# Patient Record
Sex: Female | Born: 1965 | Hispanic: No | Marital: Married | Smoking: Never smoker
Health system: Southern US, Community
[De-identification: ages and names within clinical notes are randomized; demographics above are authoritative.]

## PROBLEM LIST (undated history)

## (undated) DIAGNOSIS — Z923 Personal history of irradiation: Secondary | ICD-10-CM

## (undated) DIAGNOSIS — Z9221 Personal history of antineoplastic chemotherapy: Secondary | ICD-10-CM

## (undated) DIAGNOSIS — E0590 Thyrotoxicosis, unspecified without thyrotoxic crisis or storm: Secondary | ICD-10-CM

## (undated) DIAGNOSIS — K219 Gastro-esophageal reflux disease without esophagitis: Secondary | ICD-10-CM

## (undated) DIAGNOSIS — IMO0002 Reserved for concepts with insufficient information to code with codable children: Secondary | ICD-10-CM

## (undated) DIAGNOSIS — I10 Essential (primary) hypertension: Secondary | ICD-10-CM

## (undated) DIAGNOSIS — R51 Headache: Secondary | ICD-10-CM

## (undated) HISTORY — PX: WISDOM TOOTH EXTRACTION: SHX21

## (undated) HISTORY — PX: TUBAL LIGATION: SHX77

## (undated) HISTORY — DX: Reserved for concepts with insufficient information to code with codable children: IMO0002

## (undated) HISTORY — PX: FOOT SURGERY: SHX648

## (undated) HISTORY — PX: BREAST LUMPECTOMY: SHX2

---

## 2007-02-03 ENCOUNTER — Emergency Department (HOSPITAL_COMMUNITY): Admission: EM | Admit: 2007-02-03 | Discharge: 2007-02-04 | Payer: Self-pay | Admitting: Emergency Medicine

## 2007-03-27 DIAGNOSIS — R87619 Unspecified abnormal cytological findings in specimens from cervix uteri: Secondary | ICD-10-CM

## 2007-03-27 DIAGNOSIS — IMO0002 Reserved for concepts with insufficient information to code with codable children: Secondary | ICD-10-CM

## 2007-03-27 HISTORY — DX: Unspecified abnormal cytological findings in specimens from cervix uteri: R87.619

## 2007-03-27 HISTORY — DX: Reserved for concepts with insufficient information to code with codable children: IMO0002

## 2007-04-07 ENCOUNTER — Encounter: Admission: RE | Admit: 2007-04-07 | Discharge: 2007-04-07 | Payer: Self-pay | Admitting: Family Medicine

## 2008-01-10 ENCOUNTER — Encounter: Admission: RE | Admit: 2008-01-10 | Discharge: 2008-01-10 | Payer: Self-pay | Admitting: Neurology

## 2011-01-02 LAB — URINALYSIS, ROUTINE W REFLEX MICROSCOPIC
Bilirubin Urine: NEGATIVE
Glucose, UA: NEGATIVE
Hgb urine dipstick: NEGATIVE
Ketones, ur: NEGATIVE
Nitrite: NEGATIVE
Protein, ur: NEGATIVE
Specific Gravity, Urine: 1.012
Urobilinogen, UA: 0.2
pH: 7.5

## 2011-01-02 LAB — COMPREHENSIVE METABOLIC PANEL
ALT: 20
AST: 18
Albumin: 3.2 — ABNORMAL LOW
Alkaline Phosphatase: 67
BUN: 11
CO2: 26
Calcium: 8.1 — ABNORMAL LOW
Chloride: 99
Creatinine, Ser: 0.68
GFR calc Af Amer: >60
GFR calc non Af Amer: >60
Glucose, Bld: 151 — ABNORMAL HIGH
Potassium: 3.5
Sodium: 132 — ABNORMAL LOW
Total Bilirubin: 0.4
Total Protein: 6.2

## 2011-01-02 LAB — DIFFERENTIAL
Basophils Absolute: 0
Basophils Relative: 0
Eosinophils Absolute: 0.1 — ABNORMAL LOW
Eosinophils Relative: 1
Lymphocytes Relative: 12
Lymphs Abs: 1.4
Monocytes Absolute: 0.4
Monocytes Relative: 3
Neutro Abs: 10.4 — ABNORMAL HIGH
Neutrophils Relative %: 85 — ABNORMAL HIGH

## 2011-01-02 LAB — CBC
HCT: 34 — ABNORMAL LOW
Hemoglobin: 11.4 — ABNORMAL LOW
MCHC: 33.6
MCV: 83.2
Platelets: 312
RBC: 4.08
RDW: 13.9
WBC: 12.4 — ABNORMAL HIGH

## 2011-01-02 LAB — LIPASE, BLOOD: Lipase: 25

## 2011-01-02 LAB — PREGNANCY, URINE: Preg Test, Ur: NEGATIVE

## 2011-06-01 ENCOUNTER — Encounter (INDEPENDENT_AMBULATORY_CARE_PROVIDER_SITE_OTHER): Payer: BC Managed Care – PPO | Admitting: Obstetrics and Gynecology

## 2011-06-01 ENCOUNTER — Other Ambulatory Visit (INDEPENDENT_AMBULATORY_CARE_PROVIDER_SITE_OTHER): Payer: BC Managed Care – PPO

## 2011-06-01 DIAGNOSIS — N920 Excessive and frequent menstruation with regular cycle: Secondary | ICD-10-CM

## 2011-06-01 DIAGNOSIS — N946 Dysmenorrhea, unspecified: Secondary | ICD-10-CM

## 2011-06-19 ENCOUNTER — Other Ambulatory Visit (INDEPENDENT_AMBULATORY_CARE_PROVIDER_SITE_OTHER): Payer: BC Managed Care – PPO

## 2011-06-19 DIAGNOSIS — N920 Excessive and frequent menstruation with regular cycle: Secondary | ICD-10-CM

## 2011-06-28 ENCOUNTER — Other Ambulatory Visit: Payer: Self-pay | Admitting: Internal Medicine

## 2011-06-28 DIAGNOSIS — R7989 Other specified abnormal findings of blood chemistry: Secondary | ICD-10-CM

## 2011-07-10 ENCOUNTER — Ambulatory Visit (HOSPITAL_COMMUNITY): Payer: Self-pay

## 2011-07-11 ENCOUNTER — Other Ambulatory Visit (HOSPITAL_COMMUNITY): Payer: Self-pay

## 2011-07-31 ENCOUNTER — Ambulatory Visit (HOSPITAL_COMMUNITY): Payer: Self-pay

## 2011-07-31 ENCOUNTER — Encounter (HOSPITAL_COMMUNITY)
Admission: RE | Admit: 2011-07-31 | Discharge: 2011-07-31 | Disposition: A | Discharge status: Home / Self Care | Payer: BC Managed Care – PPO | Source: Ambulatory Visit | Attending: Internal Medicine | Admitting: Internal Medicine

## 2011-07-31 DIAGNOSIS — R7989 Other specified abnormal findings of blood chemistry: Secondary | ICD-10-CM

## 2011-08-01 ENCOUNTER — Ambulatory Visit (HOSPITAL_COMMUNITY)
Admission: RE | Admit: 2011-08-01 | Discharge: 2011-08-01 | Disposition: A | Discharge status: Home / Self Care | Payer: BC Managed Care – PPO | Source: Ambulatory Visit | Attending: Internal Medicine | Admitting: Internal Medicine

## 2011-08-01 ENCOUNTER — Other Ambulatory Visit (HOSPITAL_COMMUNITY): Payer: Self-pay

## 2011-08-01 DIAGNOSIS — E041 Nontoxic single thyroid nodule: Secondary | ICD-10-CM | POA: Insufficient documentation

## 2011-08-01 DIAGNOSIS — E0590 Thyrotoxicosis, unspecified without thyrotoxic crisis or storm: Secondary | ICD-10-CM | POA: Insufficient documentation

## 2011-08-01 MED ORDER — SODIUM PERTECHNETATE TC 99M INJECTION
10.0000 | Freq: Once | INTRAVENOUS | Status: AC | PRN
  Administered 2011-08-01: 10 via INTRAVENOUS

## 2011-08-01 MED ORDER — SODIUM IODIDE I 131 CAPSULE
10.0000 | Freq: Once | INTRAVENOUS | Status: AC | PRN
  Administered 2011-08-01: 10 via ORAL

## 2011-11-15 ENCOUNTER — Encounter (HOSPITAL_COMMUNITY): Payer: Self-pay | Admitting: Pharmacist

## 2011-11-22 ENCOUNTER — Encounter (HOSPITAL_COMMUNITY)
Admission: RE | Admit: 2011-11-22 | Discharge: 2011-11-22 | Disposition: A | Discharge status: Home / Self Care | Payer: BC Managed Care – PPO | Source: Ambulatory Visit | Attending: Obstetrics and Gynecology | Admitting: Obstetrics and Gynecology

## 2011-11-22 ENCOUNTER — Encounter (HOSPITAL_COMMUNITY): Payer: Self-pay

## 2011-11-22 ENCOUNTER — Telehealth: Payer: Self-pay | Admitting: Obstetrics and Gynecology

## 2011-11-22 HISTORY — DX: Headache: R51

## 2011-11-22 HISTORY — DX: Thyrotoxicosis, unspecified without thyrotoxic crisis or storm: E05.90

## 2011-11-22 HISTORY — DX: Essential (primary) hypertension: I10

## 2011-11-22 LAB — CBC
HCT: 36.1 % (ref 36.0–46.0)
Hemoglobin: 11.3 g/dL — ABNORMAL LOW (ref 12.0–15.0)
MCH: 25.3 pg — ABNORMAL LOW (ref 26.0–34.0)
MCHC: 31.3 g/dL (ref 30.0–36.0)
MCV: 80.9 fL (ref 78.0–100.0)
Platelets: 340 10*3/uL (ref 150–400)
RBC: 4.46 MIL/uL (ref 3.87–5.11)
RDW: 15.3 % (ref 11.5–15.5)
WBC: 6.3 10*3/uL (ref 4.0–10.5)

## 2011-11-22 LAB — BASIC METABOLIC PANEL
BUN: 16 mg/dL (ref 6–23)
CO2: 28 mEq/L (ref 19–32)
Calcium: 8.8 mg/dL (ref 8.4–10.5)
Chloride: 103 mEq/L (ref 96–112)
Creatinine, Ser: 0.77 mg/dL (ref 0.50–1.10)
GFR calc Af Amer: >90 mL/min (ref >90)
GFR calc non Af Amer: >90 mL/min (ref >90)
Glucose, Bld: 100 mg/dL — ABNORMAL HIGH (ref 70–99)
Potassium: 4.2 mEq/L (ref 3.5–5.1)
Sodium: 138 mEq/L (ref 135–145)

## 2011-11-22 NOTE — Patient Instructions (Addendum)
Your procedure is scheduled on:11/30/11  Enter through the Main Entrance at :0715 Pick up desk phone and dial [phone] and inform us of your arrival.  Please call [phone] if you have any problems the morning of surgery.  Remember: Do not eat after midnight:Thursday Do not drink after:midnight Thursday  Take these meds the morning of surgery with a sip of water: BP pill, Tapazole  DO NOT wear jewelry, eye make-up, lipstick,body lotion, or dark fingernail polish. Do not shave for 48 hours prior to surgery.   Patients discharged on the day of surgery will not be allowed to drive home.   Remember to use your Hibiclens as instructed.

## 2011-11-22 NOTE — Telephone Encounter (Signed)
Hysteroscopy D&C; Novasure scheduled for 11/30/11 @ 8:45 with AR>  [employer] effective 03/26/10. Plan pays 80/20 after a $500 deductible. Pre-op due $101.62.  -Adrianne Pridgen

## 2011-11-29 ENCOUNTER — Encounter: Payer: Self-pay | Admitting: Obstetrics and Gynecology

## 2011-11-29 ENCOUNTER — Ambulatory Visit (INDEPENDENT_AMBULATORY_CARE_PROVIDER_SITE_OTHER): Payer: BC Managed Care – PPO | Admitting: Obstetrics and Gynecology

## 2011-11-29 VITALS — BP 118/72 | HR 78 | Temp 98.2°F | Resp 16 | Ht 59.5 in | Wt 165.0 lb

## 2011-11-29 DIAGNOSIS — Z01818 Encounter for other preprocedural examination: Secondary | ICD-10-CM

## 2011-11-29 NOTE — H&P (Signed)
Sherry Carrillo is a 46 y.o. female G2P0 who presents for hysteroscopy, dilatation and curettage with Novasure endometrial ablation because of menorrhagia. For the past twenty-three years, patient has had a five day menstrual flow with pad change every hour, due to clots,  that has worsened over the years. Additionally she's had severe menstrual cramping rated as an 8/10 on a 10-point pain scale that is not relieved with Ibuprofen 800 mg or Vicodin.  In 2011 she had an ultrasound that showed a slightly enlarged uterus with questions of adenomyosis but otherwise normal.  At that same time an endometrial biopsy did not reveal any atypia, hyperplasia or malignancy but fragments of a benign polyp.  A pelvic ultrasound n March of this year showed a uterus-anteflexed, 8.90 x 5.54 x 5.17 cm, normal appearing ovaries and endometrium 0.67 cm.  An endometrial biopsy at that same time returned benign proliferative endometrium without atypia, malignancy or hyperplasia.  A thyroid panel done as part of her evaluation for menorrhagia revealed hyperthyroidism.   She is being followed by an endocrinologist and on methimazole.  A review of both medical and surgical management options were given to the patient, along with risks and benefits of each however, due to the protracted nature of her symptoms, she desires surgical intervention in the form of hysteroscopy, D & C and endometrial ablation.  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 ; Has a history of an abnormal PAP treated with a ? LEEP-2008, PAP smears normal since;  Last PAP smear 05/2011  Medical History: hyperthyroidism, hypertension  Surgical History: 1989 Tubal Sterilization;  1991 Left Foot Cystectomy   2008 LEEP? 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 benazepril (LOTENSIN) 20 MG tablet Take 20 mg by mouth daily.      . methimazole (TAPAZOLE) 5 MG tablet Take 5 mg by mouth daily.        Allergies  Allergen Reactions  . Sulfa Antibiotics     [Sulfa causes nausea] Denies sensitivity to soy, peanuts, shellfish, latex, or adhesives  ROS: Admits to glasses;  Denies headache, vision changes, dysphagia, tinnitus, dizziness,  chest pain, shortness of breath, nausea, vomiting, diarrhea, dysuria, hematuria, pelvic pain, swelling of joints,easy bruising,  myalgias, arthralgias, skin rashes and except as is mentioned in the history of present illness, patient's review of systems is otherwise negative   Physical Exam    BP 118/72  Pulse 78  Temp 98.2 F (36.8 C) (Oral)  Resp 16  Ht 4' 11.5" (1.511 m)  Wt 165 lb (74.844 kg)  BMI 32.77 kg/m2  LMP 11/12/2011  Neck: supple without masses or thyromegaly Lungs: clear to auscultation Heart: regular rate and rhythm Abdomen: soft, non-tender and no organomegaly Pelvic:EGBUS- wnl; vagina-normal rugae; uterus-normal size and tender, cervix without lesions or motion tenderness; adnexae-no tenderness or masses Extremities:  no clubbing, cyanosis or edema   Assesment:  Menorrhagia   Disposition:  A discussion was held with patient regarding the indication for her procedure(s) along with the risks, which include but are not limited  to: reaction to anesthesia, damage to adjacent organs, infection and excessive bleeding. Hystersocopy D&C with Novasure endometrial ablation at [facility] [place] [facility], November 30, 2011 @ 8:30 a.m.  CSN# [id number]   Florette Thai J. Lowell Guitar, PA-C  for Dr. Woodroe Mode. Su Hilt

## 2011-11-29 NOTE — Progress Notes (Signed)
Sherry Carrillo is a 46 y.o. female G2P0 who presents for hysteroscopy, dilatation and curettage with Novasure endometrial ablation because of menorrhagia. For the past twenty-three years, patient has had a five day menstrual flow with pad change every hour, due to clots,  that has worsened over the years. Additionally she's had severe menstrual cramping rated as an 8/10 on a 10-point pain scale that is not relieved with Ibuprofen 800 mg or Vicodin.  In 2011 she had an ultrasound that showed a slightly enlarged uterus with questions of adenomyosis but otherwise normal.  At that same time an endometrial biopsy did not reveal any atypia, hyperplasia or malignancy but fragments of a benign polyp.  A pelvic ultrasound n March of this year showed a uterus-anteflexed, 8.90 x 5.54 x 5.17 cm, normal appearing ovaries and endometrium 0.67 cm.  An endometrial biopsy at that same time returned benign proliferative endometrium without atypia, malignancy or hyperplasia.  A thyroid panel done as part of her evaluation for menorrhagia revealed hyperthyroidism.   She is being followed by an endocrinologist and on methimazole.  A review of both medical and surgical management options were given to the patient, along with risks and benefits of each however, due to the protracted nature of her symptoms, she desires surgical intervention. 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 ; Has a history of an abnormal PAP treated with a ? LEEP-2008, PAP smears normal since;  Last PAP smear 05/2011  Medical History: hyperthyroidism, hypertension  Surgical History: 1989 Tubal Sterilization;  1991 Left Foot Cystectomy   2008 LEEP? 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 benazepril (LOTENSIN) 20 MG tablet Take 20 mg by mouth daily.      . methimazole (TAPAZOLE) 5 MG tablet Take 5 mg by mouth daily.        Allergies  Allergen Reactions  . Sulfa Antibiotics     [Sulfa causes nausea] Denies sensitivity to soy, peanuts, shellfish, latex, or adhesives  ROS: Admits to glasses;  Denies headache, vision changes, dysphagia, tinnitus, dizziness,  chest pain, shortness of breath, nausea, vomiting, diarrhea, dysuria, hematuria, pelvic pain, swelling of joints,easy bruising,  myalgias, arthralgias, skin rashes and except as is mentioned in the history of present illness, patient's review of systems is otherwise negative   Physical Exam    BP 118/72  Pulse 78  Temp 98.2 F (36.8 C) (Oral)  Resp 16  Ht 4' 11.5" (1.511 m)  Wt 165 lb (74.844 kg)  BMI 32.77 kg/m2  LMP 11/12/2011  Neck: supple without masses or thyromegaly Lungs: clear to auscultation Heart: regular rate and rhythm Abdomen: soft, non-tender and no organomegaly Pelvic:EGBUS- wnl; vagina-normal rugae; uterus-normal size and tender, cervix without lesions or motion tenderness; adnexae-no tenderness or masses Extremities:  no clubbing, cyanosis or edema   Assesment:  Menorrhagia   Disposition:  A discussion was held with patient regarding the indication for her procedure(s) along with the risks, which include but are not limited to: reaction to anesthesia, damage to adjacent organs, infection and excessive bleeding.  Hystersocopy D&C with Novasure endometrial ablation at [facility] [place], November 30, 2011 @ 8:30 a.m.  CSN# [id number]   Odelle Kosier J. Lowell Guitar, PA-C  for Dr. Woodroe Mode. Su Hilt

## 2011-11-30 ENCOUNTER — Encounter (HOSPITAL_COMMUNITY): Payer: Self-pay | Admitting: *Deleted

## 2011-11-30 ENCOUNTER — Encounter (HOSPITAL_COMMUNITY): Payer: Self-pay

## 2011-11-30 ENCOUNTER — Encounter (HOSPITAL_COMMUNITY)
Admission: RE | Disposition: A | Discharge status: Home / Self Care | Payer: Self-pay | Source: Ambulatory Visit | Attending: Obstetrics and Gynecology

## 2011-11-30 ENCOUNTER — Ambulatory Visit (HOSPITAL_COMMUNITY): Payer: BC Managed Care – PPO

## 2011-11-30 ENCOUNTER — Ambulatory Visit (HOSPITAL_COMMUNITY)
Admission: RE | Admit: 2011-11-30 | Discharge: 2011-11-30 | Disposition: A | Discharge status: Home / Self Care | Payer: BC Managed Care – PPO | Source: Ambulatory Visit | Attending: Obstetrics and Gynecology | Admitting: Obstetrics and Gynecology

## 2011-11-30 DIAGNOSIS — N920 Excessive and frequent menstruation with regular cycle: Secondary | ICD-10-CM

## 2011-11-30 LAB — PREGNANCY, URINE: Preg Test, Ur: NEGATIVE

## 2011-11-30 SURGERY — DILATATION & CURETTAGE/HYSTEROSCOPY WITH NOVASURE ABLATION
Anesthesia: General | Site: Vagina | Wound class: Clean Contaminated

## 2011-11-30 MED ORDER — FENTANYL CITRATE 0.05 MG/ML IJ SOLN
INTRAMUSCULAR | Status: DC | PRN
  Administered 2011-11-30 (×2): 50 ug via INTRAVENOUS

## 2011-11-30 MED ORDER — HYDROCODONE-ACETAMINOPHEN 5-500 MG PO TABS: 1.0000 | ORAL_TABLET | Freq: Four times a day (QID) | ORAL | Status: AC | PRN

## 2011-11-30 MED ORDER — PROPOFOL 10 MG/ML IV EMUL
INTRAVENOUS | Status: AC
Start: 2011-11-30 — End: 2011-11-30
  Filled 2011-11-30: qty 20

## 2011-11-30 MED ORDER — MIDAZOLAM HCL 5 MG/5ML IJ SOLN
INTRAMUSCULAR | Status: DC | PRN
  Administered 2011-11-30: 2 mg via INTRAVENOUS

## 2011-11-30 MED ORDER — ONDANSETRON HCL 4 MG/2ML IJ SOLN
INTRAMUSCULAR | Status: AC
  Filled 2011-11-30: qty 2

## 2011-11-30 MED ORDER — DEXAMETHASONE SODIUM PHOSPHATE 10 MG/ML IJ SOLN
INTRAMUSCULAR | Status: AC
  Filled 2011-11-30: qty 1

## 2011-11-30 MED ORDER — MIDAZOLAM HCL 2 MG/2ML IJ SOLN
INTRAMUSCULAR | Status: AC
  Filled 2011-11-30: qty 2

## 2011-11-30 MED ORDER — LIDOCAINE HCL (CARDIAC) 20 MG/ML IV SOLN
INTRAVENOUS | Status: AC
  Filled 2011-11-30: qty 5

## 2011-11-30 MED ORDER — ONDANSETRON HCL 4 MG/2ML IJ SOLN: 4.0000 mg | Freq: Once | INTRAMUSCULAR | Status: DC | PRN

## 2011-11-30 MED ORDER — KETOROLAC TROMETHAMINE 30 MG/ML IJ SOLN
INTRAMUSCULAR | Status: DC | PRN
  Administered 2011-11-30: 30 mg via INTRAVENOUS
  Administered 2011-11-30: 30 mg via INTRAMUSCULAR

## 2011-11-30 MED ORDER — IBUPROFEN 600 MG PO TABS: 600.0000 mg | ORAL_TABLET | Freq: Four times a day (QID) | ORAL | Status: DC | PRN

## 2011-11-30 MED ORDER — LACTATED RINGERS IV SOLN
INTRAVENOUS | Status: DC
  Administered 2011-11-30 (×2): via INTRAVENOUS

## 2011-11-30 MED ORDER — GLYCOPYRROLATE 0.2 MG/ML IJ SOLN
INTRAMUSCULAR | Status: DC | PRN
  Administered 2011-11-30: 0.2 mg via INTRAVENOUS

## 2011-11-30 MED ORDER — FENTANYL CITRATE 0.05 MG/ML IJ SOLN
INTRAMUSCULAR | Status: AC
  Filled 2011-11-30: qty 2

## 2011-11-30 MED ORDER — FENTANYL CITRATE 0.05 MG/ML IJ SOLN
INTRAMUSCULAR | Status: AC
  Administered 2011-11-30: 50 ug via INTRAVENOUS
  Filled 2011-11-30: qty 2

## 2011-11-30 MED ORDER — LACTATED RINGERS IR SOLN
Status: DC | PRN
  Administered 2011-11-30: 500 mL

## 2011-11-30 MED ORDER — DEXAMETHASONE SODIUM PHOSPHATE 4 MG/ML IJ SOLN
INTRAMUSCULAR | Status: DC | PRN
  Administered 2011-11-30: 10 mg via INTRAVENOUS

## 2011-11-30 MED ORDER — PROPOFOL 10 MG/ML IV BOLUS
INTRAVENOUS | Status: DC | PRN
  Administered 2011-11-30: 200 mg via INTRAVENOUS

## 2011-11-30 MED ORDER — LIDOCAINE HCL 1 % IJ SOLN
INTRAMUSCULAR | Status: DC | PRN
  Administered 2011-11-30: 10 mL

## 2011-11-30 MED ORDER — LIDOCAINE HCL (CARDIAC) 20 MG/ML IV SOLN
INTRAVENOUS | Status: DC | PRN
  Administered 2011-11-30: 20 mg via INTRAVENOUS

## 2011-11-30 MED ORDER — KETOROLAC TROMETHAMINE 30 MG/ML IJ SOLN
INTRAMUSCULAR | Status: AC
  Filled 2011-11-30: qty 2

## 2011-11-30 MED ORDER — PROPOFOL 10 MG/ML IV EMUL
INTRAVENOUS | Status: DC | PRN
  Administered 2011-11-30: 200 mg via INTRAVENOUS

## 2011-11-30 MED ORDER — IBUPROFEN 600 MG PO TABS: 600.0000 mg | ORAL_TABLET | Freq: Four times a day (QID) | ORAL | Status: AC | PRN

## 2011-11-30 MED ORDER — MEPERIDINE HCL 25 MG/ML IJ SOLN: 6.2500 mg | INTRAMUSCULAR | Status: DC | PRN

## 2011-11-30 MED ORDER — ONDANSETRON HCL 4 MG/2ML IJ SOLN
INTRAMUSCULAR | Status: DC | PRN
  Administered 2011-11-30: 4 mg via INTRAVENOUS

## 2011-11-30 MED ORDER — FENTANYL CITRATE 0.05 MG/ML IJ SOLN
25.0000 ug | INTRAMUSCULAR | Status: DC | PRN
  Administered 2011-11-30: 50 ug via INTRAVENOUS

## 2011-11-30 MED ORDER — GLYCOPYRROLATE 0.2 MG/ML IJ SOLN
INTRAMUSCULAR | Status: AC
  Filled 2011-11-30: qty 1

## 2011-11-30 MED ORDER — KETOROLAC TROMETHAMINE 30 MG/ML IJ SOLN: 15.0000 mg | Freq: Once | INTRAMUSCULAR | Status: DC | PRN

## 2011-11-30 MED ORDER — HYDROCODONE-ACETAMINOPHEN 5-500 MG PO TABS: 1.0000 | ORAL_TABLET | Freq: Four times a day (QID) | ORAL | Status: DC | PRN

## 2011-11-30 SURGICAL SUPPLY — 14 items
ABLATOR ENDOMETRIAL BIPOLAR (ABLATOR) ×2 IMPLANT
CATH ROBINSON RED A/P 16FR (CATHETERS) ×2 IMPLANT
CLOTH BEACON ORANGE TIMEOUT ST (SAFETY) ×2 IMPLANT
CONTAINER PREFILL 10% NBF 60ML (FORM) ×3 IMPLANT
GLOVE BIO SURGEON STRL SZ7.5 (GLOVE) ×4 IMPLANT
GLOVE BIOGEL PI IND STRL 7.5 (GLOVE) ×1 IMPLANT
GLOVE BIOGEL PI INDICATOR 7.5 (GLOVE) ×1
GOWN PREVENTION PLUS LG XLONG (DISPOSABLE) ×3 IMPLANT
GOWN STRL REIN XL XLG (GOWN DISPOSABLE) ×2 IMPLANT
PACK HYSTEROSCOPY LF (CUSTOM PROCEDURE TRAY) ×2 IMPLANT
PAD PREP 24X48 CUFFED NSTRL (MISCELLANEOUS) ×2 IMPLANT
TISSUE REPAIR XENFORM 6X10CM (Tissue) ×1 IMPLANT
TOWEL OR 17X24 6PK STRL BLUE (TOWEL DISPOSABLE) ×4 IMPLANT
WATER STERILE IRR 1000ML POUR (IV SOLUTION) ×2 IMPLANT

## 2011-11-30 NOTE — Transfer of Care (Signed)
Immediate Anesthesia Transfer of Care Note  Patient: Sherry Carrillo  Procedure(s) Performed: Procedure(s) (LRB) with comments: DILATATION & CURETTAGE/HYSTEROSCOPY WITH NOVASURE ABLATION (N/A)  Patient Location: PACU  Anesthesia Type: General  Level of Consciousness: awake, alert  and oriented  Airway & Oxygen Therapy: Patient Spontanous Breathing and Patient connected to nasal cannula oxygen  Post-op Assessment: Report given to PACU RN and Post -op Vital signs reviewed and stable  Post vital signs: Reviewed and stable  Complications: No apparent anesthesia complications

## 2011-11-30 NOTE — Op Note (Signed)
Preop Diagnosis: Menorrhagia   Postop Diagnosis: Menorrhagia   Procedure: DILATATION & CURETTAGE/HYSTEROSCOPY WITH NOVASURE ABLATION   Anesthesia: General   Anesthesiologist: Sandrea Hughs., MD   Attending: Purcell Nails, MD   Assistant: N/a  Findings: Polypoid appearing endometrium. Ut sounded 10cm, cervix 4cm, cavity length 6cm, cavity width 4.8cm, ablation performed for 58secs at a power of 158watts.  Pathology: Endometrial Curettings  Fluids: 700cc  Hysteroscopic deficit 50-100cc  UOP: QS via straight cath prior to procedure  EBL: Minimal  Complications: None  Procedure: The patient was taken to the operating room after the risks, benefits and alternatives discussed with the patient. The patient verbalized understanding and consent signed and witnessed. The patient was given a spinal per anesthesia and prepped and draped in the normal sterile fashion and Time Out performed per protocol. A bivalve speculum was placed in the patient's vagina and the anterior lip of the cervix was grasped with a single tooth tenaculum. A paracervical block was administered using a total of 10 cc of 1% lidocaine. The uterus sounded to 10 cm. The cervix was dilated for passage of the hysteroscope. The hysteroscope was introduced into the uterine cavity and findings as noted above. Sharp curettage was performed until a gritty texture was noted and currettings sent to pathology. The hysteroscope was reintroduced and no obvious remaining intracavitary lesions were noted. The Novasure instrument was introduced and ablation performed without difficulty. Hysteroscope reintroduced and good ablation results were noted. All instruments were removed. Sponge lap and needle count was correct. The patient tolerated the procedure well and was returned to the recovery room in good condition.

## 2011-11-30 NOTE — Anesthesia Postprocedure Evaluation (Signed)
Anesthesia Post Note  Patient: Sherry Carrillo  Procedure(s) Performed: Procedure(s) (LRB): DILATATION & CURETTAGE/HYSTEROSCOPY WITH NOVASURE ABLATION (N/A)  Anesthesia type: GA  Patient location: PACU  Post pain: Pain level controlled  Post assessment: Post-op Vital signs reviewed  Last Vitals:  Filed Vitals:   11/30/11 0945  BP:   Pulse:   Temp: 36.6 C  Resp: 16    Post vital signs: Reviewed  Level of consciousness: sedated  Complications: No apparent anesthesia complications

## 2011-11-30 NOTE — Anesthesia Preprocedure Evaluation (Signed)
Anesthesia Evaluation  Patient identified by MRN, date of birth, ID band Patient awake    Reviewed: Allergy & Precautions, H&P , NPO status , Patient's Chart, lab work & pertinent test results  Airway Mallampati: I TM Distance: >3 FB Neck ROM: full    Dental No notable dental hx. (+) Teeth Intact   Pulmonary neg pulmonary ROS,    Pulmonary exam normal       Cardiovascular     Neuro/Psych negative psych ROS   GI/Hepatic negative GI ROS, Neg liver ROS,   Endo/Other    Renal/GU negative Renal ROS  negative genitourinary   Musculoskeletal negative musculoskeletal ROS (+)   Abdominal Normal abdominal exam  (+)   Peds negative pediatric ROS (+)  Hematology negative hematology ROS (+)   Anesthesia Other Findings   Reproductive/Obstetrics negative OB ROS                           Anesthesia Physical Anesthesia Plan  ASA: II  Anesthesia Plan: General   Post-op Pain Management:    Induction: Intravenous  Airway Management Planned: LMA  Additional Equipment:   Intra-op Plan:   Post-operative Plan:   Informed Consent: I have reviewed the patients History and Physical, chart, labs and discussed the procedure including the risks, benefits and alternatives for the proposed anesthesia with the patient or authorized representative who has indicated his/her understanding and acceptance.     Plan Discussed with: CRNA and Surgeon  Anesthesia Plan Comments:         Anesthesia Quick Evaluation

## 2011-11-30 NOTE — Interval H&P Note (Signed)
History and Physical Interval Note:  11/30/2011 8:56 AM  Sherry Carrillo  has presented today for surgery, with the diagnosis of Menorrhagia  The various methods of treatment have been discussed with the patient and family. After consideration of risks, benefits and other options for treatment, the patient has consented to  Procedure(s) (LRB) with comments: DILATATION & CURETTAGE/HYSTEROSCOPY WITH NOVASURE ABLATION (N/A) as a surgical intervention .  The patient's history has been reviewed, patient examined, no change in status, stable for surgery.  I have reviewed the patient's chart and labs.  Questions were answered to the patient's satisfaction.     Purcell Nails

## 2011-11-30 NOTE — Anesthesia Procedure Notes (Signed)
Procedure Name: LMA Insertion Date/Time: 11/30/2011 9:10 AM Performed by: Lincoln Brigham Pre-anesthesia Checklist: Patient identified, Patient being monitored, Emergency Drugs available, Timeout performed and Suction available Patient Re-evaluated:Patient Re-evaluated prior to inductionOxygen Delivery Method: Circle system utilized Preoxygenation: Pre-oxygenation with 100% oxygen Intubation Type: IV induction Ventilation: Mask ventilation without difficulty LMA: LMA inserted LMA Size: 3.0 Number of attempts: 1 Dental Injury: Teeth and Oropharynx as per pre-operative assessment

## 2011-11-30 NOTE — H&P (View-Only) (Signed)
Sherry Carrillo is a 46 y.o. female G2P0 who presents for hysteroscopy, dilatation and curettage with Novasure endometrial ablation because of menorrhagia. For the past twenty-three years, patient has had a five day menstrual flow with pad change every hour, due to clots,  that has worsened over the years. Additionally she's had severe menstrual cramping rated as an 8/10 on a 10-point pain scale that is not relieved with Ibuprofen 800 mg or Vicodin.  In 2011 she had an ultrasound that showed a slightly enlarged uterus with questions of adenomyosis but otherwise normal.  At that same time an endometrial biopsy did not reveal any atypia, hyperplasia or malignancy but fragments of a benign polyp.  A pelvic ultrasound n March of this year showed a uterus-anteflexed, 8.90 x 5.54 x 5.17 cm, normal appearing ovaries and endometrium 0.67 cm.  An endometrial biopsy at that same time returned benign proliferative endometrium without atypia, malignancy or hyperplasia.  A thyroid panel done as part of her evaluation for menorrhagia revealed hyperthyroidism.   She is being followed by an endocrinologist and on methimazole.  A review of both medical and surgical management options were given to the patient, along with risks and benefits of each however, due to the protracted nature of her symptoms, she desires surgical intervention in the form of hysteroscopy, D & C and endometrial ablation.  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 ; Has a history of an abnormal PAP treated with a ? LEEP-2008, PAP smears normal since;  Last PAP smear 05/2011  Medical History: hyperthyroidism, hypertension  Surgical History: 1989 Tubal Sterilization;  1991 Left Foot Cystectomy   2008 LEEP? Denies problems with anesthesia or history of blood transfusions  Family History: Lung and breast cancer,  thyroid disease, hypertension, heart disease and diabetes  Social History:  Married and employed as Assistant Manager with [employer]; Denies alcohol, tobacco or illicit drug use   Outpatient Encounter Prescriptions as of 11/29/2011  Medication Sig Dispense Refill  . benazepril (LOTENSIN) 20 MG tablet Take 20 mg by mouth daily.      . methimazole (TAPAZOLE) 5 MG tablet Take 5 mg by mouth daily.        Allergies  Allergen Reactions  . Sulfa Antibiotics     [Sulfa causes nausea] Denies sensitivity to soy, peanuts, shellfish, latex, or adhesives  ROS: Admits to glasses;  Denies headache, vision changes, dysphagia, tinnitus, dizziness,  chest pain, shortness of breath, nausea, vomiting, diarrhea, dysuria, hematuria, pelvic pain, swelling of joints,easy bruising,  myalgias, arthralgias, skin rashes and except as is mentioned in the history of present illness, patient's review of systems is otherwise negative   Physical Exam    BP 118/72  Pulse 78  Temp 98.2 F (36.8 C) (Oral)  Resp 16  Ht 4' 11.5" (1.511 m)  Wt 165 lb (74.844 kg)  BMI 32.77 kg/m2  LMP 11/12/2011  Neck: supple without masses or thyromegaly Lungs: clear to auscultation Heart: regular rate and rhythm Abdomen: soft, non-tender and no organomegaly Pelvic:EGBUS- wnl; vagina-normal rugae; uterus-normal size and tender, cervix without lesions or motion tenderness; adnexae-no tenderness or masses Extremities:  no clubbing, cyanosis or edema   Assesment:  Menorrhagia   Disposition:  A discussion was held with patient regarding the indication for her procedure(s) along with the risks, which include but are not limited   to: reaction to anesthesia, damage to adjacent organs, infection and excessive bleeding. Hystersocopy D&C with Novasure endometrial ablation at [facility], November 30, 2011 @ 8:30 a.m.  CSN# [id number]   Matilda Fleig J. Devyne Hauger, PA-C  for Dr. Angela Y. Roberts   

## 2011-12-19 ENCOUNTER — Other Ambulatory Visit: Payer: Self-pay

## 2011-12-19 ENCOUNTER — Telehealth: Payer: Self-pay

## 2011-12-19 DIAGNOSIS — R739 Hyperglycemia, unspecified: Secondary | ICD-10-CM

## 2011-12-19 NOTE — Telephone Encounter (Signed)
Spoke to pt to let her know about slightly elevated blood sugar.  Pt needs fasting blood glucose done. Pt has appt Monday so I will put order in and place her on the lab schedule for this test. Pt has endocrin. So if this test comes back high she was told she will need to f/u with her PCP Dr. Clovis Riley. Melody Comas A

## 2011-12-24 ENCOUNTER — Encounter: Payer: BC Managed Care – PPO | Admitting: Obstetrics and Gynecology

## 2011-12-24 ENCOUNTER — Other Ambulatory Visit: Payer: BC Managed Care – PPO

## 2013-04-01 ENCOUNTER — Other Ambulatory Visit: Payer: Self-pay | Admitting: Obstetrics and Gynecology

## 2013-04-01 DIAGNOSIS — Z1231 Encounter for screening mammogram for malignant neoplasm of breast: Secondary | ICD-10-CM

## 2013-04-23 ENCOUNTER — Ambulatory Visit
Admission: RE | Admit: 2013-04-23 | Discharge: 2013-04-23 | Disposition: A | Discharge status: Home / Self Care | Payer: BC Managed Care – PPO | Source: Ambulatory Visit | Attending: Obstetrics and Gynecology | Admitting: Obstetrics and Gynecology

## 2013-04-23 DIAGNOSIS — Z1231 Encounter for screening mammogram for malignant neoplasm of breast: Secondary | ICD-10-CM

## 2013-05-08 ENCOUNTER — Other Ambulatory Visit: Payer: Self-pay | Admitting: Obstetrics and Gynecology

## 2013-05-08 DIAGNOSIS — R928 Other abnormal and inconclusive findings on diagnostic imaging of breast: Secondary | ICD-10-CM

## 2013-05-21 ENCOUNTER — Ambulatory Visit
Admission: RE | Admit: 2013-05-21 | Discharge: 2013-05-21 | Disposition: A | Discharge status: Home / Self Care | Payer: BC Managed Care – PPO | Source: Ambulatory Visit | Attending: Obstetrics and Gynecology | Admitting: Obstetrics and Gynecology

## 2013-05-21 DIAGNOSIS — R928 Other abnormal and inconclusive findings on diagnostic imaging of breast: Secondary | ICD-10-CM

## 2013-11-04 ENCOUNTER — Other Ambulatory Visit: Payer: Self-pay | Admitting: Obstetrics and Gynecology

## 2013-11-04 DIAGNOSIS — N63 Unspecified lump in unspecified breast: Secondary | ICD-10-CM

## 2013-11-18 ENCOUNTER — Other Ambulatory Visit: Payer: BC Managed Care – PPO

## 2013-11-24 ENCOUNTER — Other Ambulatory Visit: Payer: BC Managed Care – PPO

## 2013-12-02 ENCOUNTER — Ambulatory Visit
Admission: RE | Admit: 2013-12-02 | Discharge: 2013-12-02 | Disposition: A | Discharge status: Home / Self Care | Payer: BC Managed Care – PPO | Source: Ambulatory Visit | Attending: Obstetrics and Gynecology | Admitting: Obstetrics and Gynecology

## 2013-12-02 DIAGNOSIS — N63 Unspecified lump in unspecified breast: Secondary | ICD-10-CM

## 2014-01-25 ENCOUNTER — Encounter (HOSPITAL_COMMUNITY): Payer: Self-pay

## 2014-04-27 ENCOUNTER — Other Ambulatory Visit: Payer: Self-pay | Admitting: Family Medicine

## 2014-04-27 DIAGNOSIS — N6310 Unspecified lump in the right breast, unspecified quadrant: Secondary | ICD-10-CM

## 2014-05-06 ENCOUNTER — Ambulatory Visit
Admission: RE | Admit: 2014-05-06 | Discharge: 2014-05-06 | Disposition: A | Discharge status: Home / Self Care | Payer: BLUE CROSS/BLUE SHIELD | Source: Ambulatory Visit | Attending: Family Medicine | Admitting: Family Medicine

## 2014-05-06 DIAGNOSIS — N6310 Unspecified lump in the right breast, unspecified quadrant: Secondary | ICD-10-CM

## 2015-02-15 ENCOUNTER — Ambulatory Visit: Payer: BLUE CROSS/BLUE SHIELD | Admitting: Internal Medicine

## 2015-03-07 ENCOUNTER — Ambulatory Visit (INDEPENDENT_AMBULATORY_CARE_PROVIDER_SITE_OTHER): Payer: BLUE CROSS/BLUE SHIELD | Admitting: Family

## 2015-03-07 ENCOUNTER — Encounter: Payer: Self-pay | Admitting: Family

## 2015-03-07 VITALS — BP 138/90 | HR 85 | Temp 98.2°F | Resp 18 | Ht 59.5 in | Wt 167.8 lb

## 2015-03-07 DIAGNOSIS — I10 Essential (primary) hypertension: Secondary | ICD-10-CM

## 2015-03-07 DIAGNOSIS — M25512 Pain in left shoulder: Secondary | ICD-10-CM | POA: Insufficient documentation

## 2015-03-07 MED ORDER — NAPROXEN 500 MG PO TABS: 500.0000 mg | ORAL_TABLET | Freq: Two times a day (BID) | ORAL | Status: DC

## 2015-03-07 MED ORDER — LISINOPRIL-HYDROCHLOROTHIAZIDE 20-12.5 MG PO TABS: 1.0000 | ORAL_TABLET | Freq: Every day | ORAL | Status: DC

## 2015-03-07 NOTE — Patient Instructions (Addendum)
Thank you for choosing [facility].  Summary/Instructions:  Please ice/heat 2-3 times per day as needed.   Your prescription(s) have been submitted to your pharmacy or been printed and provided for you. Please take as directed and contact our office if you believe you are having problem(s) with the medication(s) or have any questions.  If your symptoms worsen or fail to improve, please contact our office for further instruction, or in case of emergency go directly to the emergency room at the closest medical facility.   Impingement Syndrome, Rotator Cuff, Bursitis With Rehab Impingement syndrome is a condition that involves inflammation of the tendons of the rotator cuff and the subacromial bursa, that causes pain in the shoulder. The rotator cuff consists of four tendons and muscles that control much of the shoulder and upper arm function. The subacromial bursa is a fluid filled sac that helps reduce friction between the rotator cuff and one of the bones of the shoulder (acromion). Impingement syndrome is usually an overuse injury that causes swelling of the bursa (bursitis), swelling of the tendon (tendonitis), and/or a tear of the tendon (strain). Strains are classified into three categories. Grade 1 strains cause pain, but the tendon is not lengthened. Grade 2 strains include a lengthened ligament, due to the ligament being stretched or partially ruptured. With grade 2 strains there is still function, although the function may be decreased. Grade 3 strains include a complete tear of the tendon or muscle, and function is usually impaired. SYMPTOMS   Pain around the shoulder, often at the outer portion of the upper arm.  Pain that gets worse with shoulder function, especially when reaching overhead or lifting.  Sometimes, aching when not using the arm.  Pain that wakes you up at night.  Sometimes, tenderness, swelling, warmth, or redness over the affected area.  Loss of  strength.  Limited motion of the shoulder, especially reaching behind the back (to the back pocket or to unhook bra) or across your body.  Crackling sound (crepitation) when moving the arm.  Biceps tendon pain and inflammation (in the front of the shoulder). Worse when bending the elbow or lifting. CAUSES  Impingement syndrome is often an overuse injury, in which chronic (repetitive) motions cause the tendons or bursa to become inflamed. A strain occurs when a force is paced on the tendon or muscle that is greater than it can withstand. Common mechanisms of injury include: Stress from sudden increase in duration, frequency, or intensity of training.  Direct hit (trauma) to the shoulder.  Aging, erosion of the tendon with normal use.  Bony bump on shoulder (acromial spur). RISK INCREASES WITH:  Contact sports (football, wrestling, boxing).  Throwing sports (baseball, tennis, volleyball).  Weightlifting and bodybuilding.  Heavy labor.  Previous injury to the rotator cuff, including impingement.  Poor shoulder strength and flexibility.  Failure to warm up properly before activity.  Inadequate protective equipment.  Old age.  Bony bump on shoulder (acromial spur). PREVENTION   Warm up and stretch properly before activity.  Allow for adequate recovery between workouts.  Maintain physical fitness:  Strength, flexibility, and endurance.  Cardiovascular fitness.  Learn and use proper exercise technique. PROGNOSIS  If treated properly, impingement syndrome usually goes away within 6 weeks. Sometimes surgery is required.  RELATED COMPLICATIONS   Longer healing time if not properly treated, or if not given enough time to heal.  Recurring symptoms, that result in a chronic condition.  Shoulder stiffness, frozen shoulder, or loss of motion.  Rotator cuff tendon tear.  Recurring symptoms, especially if activity is resumed too soon, with overuse, with a direct blow, or  when using poor technique. TREATMENT  Treatment first involves the use of ice and medicine, to reduce pain and inflammation. The use of strengthening and stretching exercises may help reduce pain with activity. These exercises may be performed at home or with a therapist. If non-surgical treatment is unsuccessful after more than 6 months, surgery may be advised. After surgery and rehabilitation, activity is usually possible in 3 months.  MEDICATION  If pain medicine is needed, nonsteroidal anti-inflammatory medicines (aspirin and ibuprofen), or other minor pain relievers (acetaminophen), are often advised.  Do not take pain medicine for 7 days before surgery.  Prescription pain relievers may be given, if your caregiver thinks they are needed. Use only as directed and only as much as you need.  Corticosteroid injections may be given by your caregiver. These injections should be reserved for the most serious cases, because they may only be given a certain number of times. HEAT AND COLD  Cold treatment (icing) should be applied for 10 to 15 minutes every 2 to 3 hours for inflammation and pain, and immediately after activity that aggravates your symptoms. Use ice packs or an ice massage.  Heat treatment may be used before performing stretching and strengthening activities prescribed by your caregiver, physical therapist, or athletic trainer. Use a heat pack or a warm water soak. SEEK MEDICAL CARE IF:   Symptoms get worse or do not improve in 4 to 6 weeks, despite treatment.  New, unexplained symptoms develop. (Drugs used in treatment may produce side effects.) EXERCISES  RANGE OF MOTION (ROM) AND STRETCHING EXERCISES - Impingement Syndrome (Rotator Cuff  Tendinitis, Bursitis) These exercises may help you when beginning to rehabilitate your injury. Your symptoms may go away with or without further involvement from your physician, physical therapist or athletic trainer. While completing these  exercises, remember:   Restoring tissue flexibility helps normal motion to return to the joints. This allows healthier, less painful movement and activity.  An effective stretch should be held for at least 30 seconds.  A stretch should never be painful. You should only feel a gentle lengthening or release in the stretched tissue. STRETCH - Flexion, Standing  Stand with good posture. With an underhand grip on your right / left hand, and an overhand grip on the opposite hand, grasp a broomstick or cane so that your hands are a little more than shoulder width apart.  Keeping your right / left elbow straight and shoulder muscles relaxed, push the stick with your opposite hand, to raise your right / left arm in front of your body and then overhead. Raise your arm until you feel a stretch in your right / left shoulder, but before you have increased shoulder pain.  Try to avoid shrugging your right / left shoulder as your arm rises, by keeping your shoulder blade tucked down and toward your mid-back spine. Hold for __________ seconds.  Slowly return to the starting position. Repeat __________ times. Complete this exercise __________ times per day. STRETCH - Abduction, Supine  Lie on your back. With an underhand grip on your right / left hand and an overhand grip on the opposite hand, grasp a broomstick or cane so that your hands are a little more than shoulder width apart.  Keeping your right / left elbow straight and your shoulder muscles relaxed, push the stick with your opposite hand, to raise your  right / left arm out to the side of your body and then overhead. Raise your arm until you feel a stretch in your right / left shoulder, but before you have increased shoulder pain.  Try to avoid shrugging your right / left shoulder as your arm rises, by keeping your shoulder blade tucked down and toward your mid-back spine. Hold for __________ seconds.  Slowly return to the starting position. Repeat  __________ times. Complete this exercise __________ times per day. ROM - Flexion, Active-Assisted  Lie on your back. You may bend your knees for comfort.  Grasp a broomstick or cane so your hands are about shoulder width apart. Your right / left hand should grip the end of the stick, so that your hand is positioned "thumbs-up," as if you were about to shake hands.  Using your healthy arm to lead, raise your right / left arm overhead, until you feel a gentle stretch in your shoulder. Hold for __________ seconds.  Use the stick to assist in returning your right / left arm to its starting position. Repeat __________ times. Complete this exercise __________ times per day.  ROM - Internal Rotation, Supine   Lie on your back on a firm surface. Place your right / left elbow about 60 degrees away from your side. Elevate your elbow with a folded towel, so that the elbow and shoulder are the same height.  Using a broomstick or cane and your strong arm, pull your right / left hand toward your body until you feel a gentle stretch, but no increase in your shoulder pain. Keep your shoulder and elbow in place throughout the exercise.  Hold for __________ seconds. Slowly return to the starting position. Repeat __________ times. Complete this exercise __________ times per day. STRETCH - Internal Rotation  Place your right / left hand behind your back, palm up.  Throw a towel or belt over your opposite shoulder. Grasp the towel with your right / left hand.  While keeping an upright posture, gently pull up on the towel, until you feel a stretch in the front of your right / left shoulder.  Avoid shrugging your right / left shoulder as your arm rises, by keeping your shoulder blade tucked down and toward your mid-back spine.  Hold for __________ seconds. Release the stretch, by lowering your healthy hand. Repeat __________ times. Complete this exercise __________ times per day. ROM - Internal Rotation    Using an underhand grip, grasp a stick behind your back with both hands.  While standing upright with good posture, slide the stick up your back until you feel a mild stretch in the front of your shoulder.  Hold for __________ seconds. Slowly return to your starting position. Repeat __________ times. Complete this exercise __________ times per day.  STRETCH - Posterior Shoulder Capsule   Stand or sit with good posture. Grasp your right / left elbow and draw it across your chest, keeping it at the same height as your shoulder.  Pull your elbow, so your upper arm comes in closer to your chest. Pull until you feel a gentle stretch in the back of your shoulder.  Hold for __________ seconds. Repeat __________ times. Complete this exercise __________ times per day. STRENGTHENING EXERCISES - Impingement Syndrome (Rotator Cuff Tendinitis, Bursitis) These exercises may help you when beginning to rehabilitate your injury. They may resolve your symptoms with or without further involvement from your physician, physical therapist or athletic trainer. While completing these exercises, remember:  Muscles can  gain both the endurance and the strength needed for everyday activities through controlled exercises.  Complete these exercises as instructed by your physician, physical therapist or athletic trainer. Increase the resistance and repetitions only as guided.  You may experience muscle soreness or fatigue, but the pain or discomfort you are trying to eliminate should never worsen during these exercises. If this pain does get worse, stop and make sure you are following the directions exactly. If the pain is still present after adjustments, discontinue the exercise until you can discuss the trouble with your clinician.  During your recovery, avoid activity or exercises which involve actions that place your injured hand or elbow above your head or behind your back or head. These positions stress the  tissues which you are trying to heal. STRENGTH - Scapular Depression and Adduction   With good posture, sit on a firm chair. Support your arms in front of you, with pillows, arm rests, or on a table top. Have your elbows in line with the sides of your body.  Gently draw your shoulder blades down and toward your mid-back spine. Gradually increase the tension, without tensing the muscles along the top of your shoulders and the back of your neck.  Hold for __________ seconds. Slowly release the tension and relax your muscles completely before starting the next repetition.  After you have practiced this exercise, remove the arm support and complete the exercise in standing as well as sitting position. Repeat __________ times. Complete this exercise __________ times per day.  STRENGTH - Shoulder Abductors, Isometric  With good posture, stand or sit about 4-6 inches from a wall, with your right / left side facing the wall.  Bend your right / left elbow. Gently press your right / left elbow into the wall. Increase the pressure gradually, until you are pressing as hard as you can, without shrugging your shoulder or increasing any shoulder discomfort.  Hold for __________ seconds.  Release the tension slowly. Relax your shoulder muscles completely before you begin the next repetition. Repeat __________ times. Complete this exercise __________ times per day.  STRENGTH - External Rotators, Isometric  Keep your right / left elbow at your side and bend it 90 degrees.  Step into a door frame so that the outside of your right / left wrist can press against the door frame without your upper arm leaving your side.  Gently press your right / left wrist into the door frame, as if you were trying to swing the back of your hand away from your stomach. Gradually increase the tension, until you are pressing as hard as you can, without shrugging your shoulder or increasing any shoulder discomfort.  Hold for  __________ seconds.  Release the tension slowly. Relax your shoulder muscles completely before you begin the next repetition. Repeat __________ times. Complete this exercise __________ times per day.  STRENGTH - Supraspinatus   Stand or sit with good posture. Grasp a __________ weight, or an exercise band or tubing, so that your hand is "thumbs-up," like you are shaking hands.  Slowly lift your right / left arm in a "V" away from your thigh, diagonally into the space between your side and straight ahead. Lift your hand to shoulder height or as far as you can, without increasing any shoulder pain. At first, many people do not lift their hands above shoulder height.  Avoid shrugging your right / left shoulder as your arm rises, by keeping your shoulder blade tucked down and toward your  mid-back spine.  Hold for __________ seconds. Control the descent of your hand, as you slowly return to your starting position. Repeat __________ times. Complete this exercise __________ times per day.  STRENGTH - External Rotators  Secure a rubber exercise band or tubing to a fixed object (table, pole) so that it is at the same height as your right / left elbow when you are standing or sitting on a firm surface.  Stand or sit so that the secured exercise band is at your uninjured side.  Bend your right / left elbow 90 degrees. Place a folded towel or small pillow under your right / left arm, so that your elbow is a few inches away from your side.  Keeping the tension on the exercise band, pull it away from your body, as if pivoting on your elbow. Be sure to keep your body steady, so that the movement is coming only from your rotating shoulder.  Hold for __________ seconds. Release the tension in a controlled manner, as you return to the starting position. Repeat __________ times. Complete this exercise __________ times per day.  STRENGTH - Internal Rotators   Secure a rubber exercise band or tubing to a  fixed object (table, pole) so that it is at the same height as your right / left elbow when you are standing or sitting on a firm surface.  Stand or sit so that the secured exercise band is at your right / left side.  Bend your elbow 90 degrees. Place a folded towel or small pillow under your right / left arm so that your elbow is a few inches away from your side.  Keeping the tension on the exercise band, pull it across your body, toward your stomach. Be sure to keep your body steady, so that the movement is coming only from your rotating shoulder.  Hold for __________ seconds. Release the tension in a controlled manner, as you return to the starting position. Repeat __________ times. Complete this exercise __________ times per day.  STRENGTH - Scapular Protractors, Standing   Stand arms length away from a wall. Place your hands on the wall, keeping your elbows straight.  Begin by dropping your shoulder blades down and toward your mid-back spine.  To strengthen your protractors, keep your shoulder blades down, but slide them forward on your rib cage. It will feel as if you are lifting the back of your rib cage away from the wall. This is a subtle motion and can be challenging to complete. Ask your caregiver for further instruction, if you are not sure you are doing the exercise correctly.  Hold for __________ seconds. Slowly return to the starting position, resting the muscles completely before starting the next repetition. Repeat __________ times. Complete this exercise __________ times per day. STRENGTH - Scapular Protractors, Supine  Lie on your back on a firm surface. Extend your right / left arm straight into the air while holding a __________ weight in your hand.  Keeping your head and back in place, lift your shoulder off the floor.  Hold for __________ seconds. Slowly return to the starting position, and allow your muscles to relax completely before starting the next  repetition. Repeat __________ times. Complete this exercise __________ times per day. STRENGTH - Scapular Protractors, Quadruped  Get onto your hands and knees, with your shoulders directly over your hands (or as close as you can be, comfortably).  Keeping your elbows locked, lift the back of your rib cage up into your  shoulder blades, so your mid-back rounds out. Keep your neck muscles relaxed.  Hold this position for __________ seconds. Slowly return to the starting position and allow your muscles to relax completely before starting the next repetition. Repeat __________ times. Complete this exercise __________ times per day.  STRENGTH - Scapular Retractors  Secure a rubber exercise band or tubing to a fixed object (table, pole), so that it is at the height of your shoulders when you are either standing, or sitting on a firm armless chair.  With a palm down grip, grasp an end of the band in each hand. Straighten your elbows and lift your hands straight in front of you, at shoulder height. Step back, away from the secured end of the band, until it becomes tense.  Squeezing your shoulder blades together, draw your elbows back toward your sides, as you bend them. Keep your upper arms lifted away from your body throughout the exercise.  Hold for __________ seconds. Slowly ease the tension on the band, as you reverse the directions and return to the starting position. Repeat __________ times. Complete this exercise __________ times per day. STRENGTH - Shoulder Extensors   Secure a rubber exercise band or tubing to a fixed object (table, pole) so that it is at the height of your shoulders when you are either standing, or sitting on a firm armless chair.  With a thumbs-up grip, grasp an end of the band in each hand. Straighten your elbows and lift your hands straight in front of you, at shoulder height. Step back, away from the secured end of the band, until it becomes tense.  Squeezing your  shoulder blades together, pull your hands down to the sides of your thighs. Do not allow your hands to go behind you.  Hold for __________ seconds. Slowly ease the tension on the band, as you reverse the directions and return to the starting position. Repeat __________ times. Complete this exercise __________ times per day.  STRENGTH - Scapular Retractors and External Rotators   Secure a rubber exercise band or tubing to a fixed object (table, pole) so that it is at the height as your shoulders, when you are either standing, or sitting on a firm armless chair.  With a palm down grip, grasp an end of the band in each hand. Bend your elbows 90 degrees and lift your elbows to shoulder height, at your sides. Step back, away from the secured end of the band, until it becomes tense.  Squeezing your shoulder blades together, rotate your shoulders so that your upper arms and elbows remain stationary, but your fists travel upward to head height.  Hold for __________ seconds. Slowly ease the tension on the band, as you reverse the directions and return to the starting position. Repeat __________ times. Complete this exercise __________ times per day.  STRENGTH - Scapular Retractors and External Rotators, Rowing   Secure a rubber exercise band or tubing to a fixed object (table, pole) so that it is at the height of your shoulders, when you are either standing, or sitting on a firm armless chair.  With a palm down grip, grasp an end of the band in each hand. Straighten your elbows and lift your hands straight in front of you, at shoulder height. Step back, away from the secured end of the band, until it becomes tense.  Step 1: Squeeze your shoulder blades together. Bending your elbows, draw your hands to your chest, as if you are rowing a boat. At  the end of this motion, your hands and elbow should be at shoulder height and your elbows should be out to your sides.  Step 2: Rotate your shoulders, to raise  your hands above your head. Your forearms should be vertical and your upper arms should be horizontal.  Hold for __________ seconds. Slowly ease the tension on the band, as you reverse the directions and return to the starting position. Repeat __________ times. Complete this exercise __________ times per day.  STRENGTH - Scapular Depressors  Find a sturdy chair without wheels, such as a dining room chair.  Keeping your feet on the floor, and your hands on the chair arms, lift your bottom up from the seat, and lock your elbows.  Keeping your elbows straight, allow gravity to pull your body weight down. Your shoulders will rise toward your ears.  Raise your body against gravity by drawing your shoulder blades down your back, shortening the distance between your shoulders and ears. Although your feet should always maintain contact with the floor, your feet should progressively support less body weight, as you get stronger.  Hold for __________ seconds. In a controlled and slow manner, lower your body weight to begin the next repetition. Repeat __________ times. Complete this exercise __________ times per day.    This information is not intended to replace advice given to you by your health care provider. Make sure you discuss any questions you have with your health care provider.   Document Released: 03/12/2005 Document Revised: 04/02/2014 Document Reviewed: 06/24/2008 [employer]vier Interactive Patient Education [employer].

## 2015-03-07 NOTE — Assessment & Plan Note (Signed)
Blood pressure remains slightly elevated above goal 140/90 with current regimen. Patient requesting to switch medications secondary to cost of medications. Discontinue benazepril-hydrochlorothiazide. Start lisinopril-hydrochlorothiazide. Continue to monitor blood pressure at home. Follow-up in one month or sooner if needed.

## 2015-03-07 NOTE — Progress Notes (Signed)
Pre visit review using our clinic review tool, if applicable. No additional management support is needed unless otherwise documented below in the visit note. 

## 2015-03-07 NOTE — Progress Notes (Signed)
Subjective:    Patient ID: Sherry Carrillo, female    DOB: [birth year], 49 y.o.   MRN: [id number]  Chief Complaint  Patient presents with  . Establish Care    left shoulder and arm has been hurting her for a few months, has limited ROM    HPI:  Sherry Carrillo is a 49 y.o. female who  has a past medical history of Hypertension; Hyperthyroidism; Abnormal pap (2009); SVD (spontaneous vaginal delivery); and Headache(784.0). and presents today for an office visit to establish care.   1.) Hypertension - Currently managed with benazepril. Takes the medication as prescribed and denies adverse side effects. Denies symptoms of end organ damage. Last eye exam was in 2016.  BP Readings from Last 3 Encounters:  03/07/15 138/90  11/30/11 106/70  11/29/11 118/72    2.) Left shoulder pain - Associated symptom of pain located in her left shoulder has been going on for about 2 months. Pain is described as really sore. Modifying factors include massage which did help some. Notes it during daily activities and timing of the symptoms is worse at night and has woke her up from sleep. There was one incident where she her husband had been playing around and noted discomfort in her arm since then. Denies any sounds or sensation heard or felt. Denies numbness and tingling.t  Allergies  Allergen Reactions  . Sulfa Antibiotics Nausea Only     Outpatient Prescriptions Prior to Visit  Medication Sig Dispense Refill  . benazepril (LOTENSIN) 20 MG tablet Take 20 mg by mouth daily.    . methimazole (TAPAZOLE) 5 MG tablet Take 5 mg by mouth daily.     No facility-administered medications prior to visit.     Past Medical History  Diagnosis Date  . Hypertension   . Hyperthyroidism   . Abnormal pap 2009    PT HAD SURGERY BUT DON'T KNOW WHAT TYPE  . SVD (spontaneous vaginal delivery)     x 2  . Headache(784.0)     otc meds prn     Past Surgical History  Procedure Laterality Date  . Tubal ligation    .  Foot surgery      left  . Wisdom tooth extraction       Family History  Problem Relation Age of Onset  . Cancer Mother     LUNG  . Hypertension Mother   . Diabetes Mother   . Heart disease Father   . Thyroid disease Sister   . Diabetes Brother   . Diabetes Maternal Grandmother   . Heart disease Paternal Grandmother   . Heart disease Paternal Grandfather      Social History   Social History  . Marital Status: Married    Spouse Name: N/A  . Number of Children: 2  . Years of Education: 14   Occupational History  . Not on file.   Social History Main Topics  . Smoking status: Never Smoker   . Smokeless tobacco: Never Used  . Alcohol Use: No  . Drug Use: No  . Sexual Activity: Yes    Birth Control/ Protection: Surgical     Comment: BTL   Other Topics Concern  . Not on file   Social History Narrative   Fun: [employer]   Denies abuse and feels safe at home.     Review of Systems  Constitutional: Negative for fever and chills.  Respiratory: Negative for chest tightness.   Cardiovascular: Negative for chest pain, palpitations and  leg swelling.  Musculoskeletal:       Positive for left shoulder pain.      Objective:    BP 138/90 mmHg  Pulse 85  Temp(Src) 98.2 F (36.8 C) (Oral)  Resp 18  Ht 4' 11.5" (1.511 m)  Wt 167 lb 12.8 oz (76.114 kg)  BMI 33.34 kg/m2  SpO2 98% Nursing note and vital signs reviewed.  Physical Exam  Constitutional: She is oriented to person, place, and time. She appears well-developed and well-nourished. No distress.  Cardiovascular: Normal rate, regular rhythm, normal heart sounds and intact distal pulses.   Pulmonary/Chest: Effort normal and breath sounds normal.  Musculoskeletal:  Left shoulder - no obvious deformity, discoloration, or edema noted. Tenderness of the subacromial space with no other tenderness elicited. Active and passive range of motion are full with some discomfort noted greater than 90 of flexion and abduction.  Strength is 4+. External rotation does result in increased discomfort. Negative apprehension test. Negative Michel Bickers. Discomfort with empty can. Distal pulses and sensation are intact and appropriate.  Neurological: She is alert and oriented to person, place, and time.  Skin: Skin is warm and dry.  Psychiatric: She has a normal mood and affect. Her behavior is normal. Judgment and thought content normal.       Assessment & Plan:   Problem List Items Addressed This Visit      Cardiovascular and Mediastinum   Essential hypertension    Blood pressure remains slightly elevated above goal 140/90 with current regimen. Patient requesting to switch medications secondary to cost of medications. Discontinue benazepril-hydrochlorothiazide. Start lisinopril-hydrochlorothiazide. Continue to monitor blood pressure at home. Follow-up in one month or sooner if needed.      Relevant Medications   lisinopril-hydrochlorothiazide (ZESTORETIC) 20-12.5 MG tablet     Other   Left shoulder pain - Primary    Left shoulder pain possibly related to subacromial bursitis or rotator cuff tendinitis. Start naproxen. Start home exercise therapy with heat/ice as needed. Follow-up if symptoms worsen or fail to improve for imaging and/or further evaluation.      Relevant Medications   naproxen (NAPROSYN) 500 MG tablet

## 2015-03-07 NOTE — Assessment & Plan Note (Signed)
Left shoulder pain possibly related to subacromial bursitis or rotator cuff tendinitis. Start naproxen. Start home exercise therapy with heat/ice as needed. Follow-up if symptoms worsen or fail to improve for imaging and/or further evaluation.

## 2015-03-25 ENCOUNTER — Emergency Department (HOSPITAL_COMMUNITY): Payer: BLUE CROSS/BLUE SHIELD

## 2015-03-25 ENCOUNTER — Encounter (HOSPITAL_COMMUNITY): Payer: Self-pay | Admitting: *Deleted

## 2015-03-25 ENCOUNTER — Emergency Department (HOSPITAL_COMMUNITY)
Admission: EM | Admit: 2015-03-25 | Discharge: 2015-03-25 | Disposition: A | Discharge status: Home / Self Care | Payer: BLUE CROSS/BLUE SHIELD | Attending: Emergency Medicine | Admitting: Emergency Medicine

## 2015-03-25 ENCOUNTER — Ambulatory Visit: Payer: BLUE CROSS/BLUE SHIELD | Admitting: Family Medicine

## 2015-03-25 DIAGNOSIS — Z8639 Personal history of other endocrine, nutritional and metabolic disease: Secondary | ICD-10-CM | POA: Insufficient documentation

## 2015-03-25 DIAGNOSIS — I10 Essential (primary) hypertension: Secondary | ICD-10-CM | POA: Diagnosis not present

## 2015-03-25 DIAGNOSIS — B349 Viral infection, unspecified: Secondary | ICD-10-CM

## 2015-03-25 DIAGNOSIS — R000 Tachycardia, unspecified: Secondary | ICD-10-CM | POA: Insufficient documentation

## 2015-03-25 DIAGNOSIS — Z79899 Other long term (current) drug therapy: Secondary | ICD-10-CM | POA: Diagnosis not present

## 2015-03-25 DIAGNOSIS — Z791 Long term (current) use of non-steroidal anti-inflammatories (NSAID): Secondary | ICD-10-CM | POA: Insufficient documentation

## 2015-03-25 DIAGNOSIS — R05 Cough: Secondary | ICD-10-CM | POA: Diagnosis present

## 2015-03-25 LAB — BASIC METABOLIC PANEL
Anion gap: 12 (ref 5–15)
BUN: 10 mg/dL (ref 6–20)
CO2: 21 mmol/L — ABNORMAL LOW (ref 22–32)
Calcium: 8.8 mg/dL — ABNORMAL LOW (ref 8.9–10.3)
Chloride: 103 mmol/L (ref 101–111)
Creatinine, Ser: 0.84 mg/dL (ref 0.44–1.00)
GFR calc Af Amer: >60 mL/min (ref >60)
GFR calc non Af Amer: >60 mL/min (ref >60)
Glucose, Bld: 122 mg/dL — ABNORMAL HIGH (ref 65–99)
Potassium: 3.4 mmol/L — ABNORMAL LOW (ref 3.5–5.1)
Sodium: 136 mmol/L (ref 135–145)

## 2015-03-25 LAB — CBC
HCT: 44.3 % (ref 36.0–46.0)
Hemoglobin: 14.6 g/dL (ref 12.0–15.0)
MCH: 28.3 pg (ref 26.0–34.0)
MCHC: 33 g/dL (ref 30.0–36.0)
MCV: 86 fL (ref 78.0–100.0)
Platelets: 285 10*3/uL (ref 150–400)
RBC: 5.15 MIL/uL — ABNORMAL HIGH (ref 3.87–5.11)
RDW: 13 % (ref 11.5–15.5)
WBC: 15.6 10*3/uL — ABNORMAL HIGH (ref 4.0–10.5)

## 2015-03-25 LAB — INFLUENZA PANEL BY PCR (TYPE A & B)
H1N1 flu by pcr: NOT DETECTED
Influenza A By PCR: NEGATIVE
Influenza B By PCR: NEGATIVE

## 2015-03-25 LAB — I-STAT CG4 LACTIC ACID, ED: Lactic Acid, Venous: 1.15 mmol/L (ref 0.5–2.0)

## 2015-03-25 MED ORDER — BENZONATATE 100 MG PO CAPS: 100.0000 mg | ORAL_CAPSULE | Freq: Three times a day (TID) | ORAL | Status: DC

## 2015-03-25 MED ORDER — ONDANSETRON HCL 4 MG/2ML IJ SOLN
4.0000 mg | Freq: Once | INTRAMUSCULAR | Status: AC
  Administered 2015-03-25: 4 mg via INTRAVENOUS
  Filled 2015-03-25: qty 2

## 2015-03-25 MED ORDER — ACETAMINOPHEN 325 MG PO TABS
650.0000 mg | ORAL_TABLET | Freq: Once | ORAL | Status: AC | PRN
  Administered 2015-03-25: 650 mg via ORAL
  Filled 2015-03-25: qty 2

## 2015-03-25 MED ORDER — KETOROLAC TROMETHAMINE 30 MG/ML IJ SOLN
30.0000 mg | Freq: Once | INTRAMUSCULAR | Status: AC
  Administered 2015-03-25: 30 mg via INTRAVENOUS
  Filled 2015-03-25: qty 1

## 2015-03-25 MED ORDER — SODIUM CHLORIDE 0.9 % IV BOLUS (SEPSIS)
1000.0000 mL | Freq: Once | INTRAVENOUS | Status: AC
  Administered 2015-03-25: 1000 mL via INTRAVENOUS

## 2015-03-25 NOTE — ED Notes (Signed)
Pt reports flu like symptoms x 2 days. Pt reports sore throat, fatigue, cough, headache and lack of appetite. Denies vomiting or diarrhea. HR 130 at triage. Mask on pt.

## 2015-03-25 NOTE — ED Provider Notes (Signed)
CSN: [id number]     Arrival date & time 03/25/15  1152 History   First MD Initiated Contact with Patient 03/25/15 1209     Chief Complaint  Patient presents with  . Influenza     (Consider location/radiation/quality/duration/timing/severity/associated sxs/prior Treatment) HPI Comments: 49 year old female with past history including hypertension, hypothyroidism who presents with cough, sore throat, and malaise. For the past 2-3 days, the patient has had flulike symptoms including sore throat, generalized fatigue, dry cough, headache, and decreased appetite. She had some mild vomiting this morning but no diarrhea or abdominal pain. She denies any chest pain or shortness of breath. No sick contacts or recent travel.  The history is provided by the patient.    Past Medical History  Diagnosis Date  . Hypertension   . Hyperthyroidism   . Abnormal pap 2009    PT HAD SURGERY BUT DON'T KNOW WHAT TYPE  . SVD (spontaneous vaginal delivery)     x 2  . Headache(784.0)     otc meds prn   Past Surgical History  Procedure Laterality Date  . Tubal ligation    . Foot surgery      left  . Wisdom tooth extraction     Family History  Problem Relation Age of Onset  . Cancer Mother     LUNG  . Hypertension Mother   . Diabetes Mother   . Heart disease Father   . Thyroid disease Sister   . Diabetes Brother   . Diabetes Maternal Grandmother   . Heart disease Paternal Grandmother   . Heart disease Paternal Grandfather    Social History  Substance Use Topics  . Smoking status: Never Smoker   . Smokeless tobacco: Never Used  . Alcohol Use: No   OB History    Gravida Para Term Preterm AB TAB SAB Ectopic Multiple Living   2         2     Review of Systems 10 Systems reviewed and are negative for acute change except as noted in the HPI.    Allergies  Sulfa antibiotics  Home Medications   Prior to Admission medications   Medication Sig Start Date End Date Taking? Authorizing  Provider  benzonatate (TESSALON) 100 MG capsule Take 1 capsule (100 mg total) by mouth every 8 (eight) hours. 03/25/15   Sharlett Iles, MD  lisinopril-hydrochlorothiazide (ZESTORETIC) 20-12.5 MG tablet Take 1 tablet by mouth daily. 03/07/15   Golden Circle, FNP  naproxen (NAPROSYN) 500 MG tablet Take 1 tablet (500 mg total) by mouth 2 (two) times daily with a meal. 03/07/15   Golden Circle, FNP   BP 130/86 mmHg  Pulse 101  Temp(Src) 98.6 F (37 C) (Oral)  Resp 19  SpO2 99% Physical Exam  Constitutional: She is oriented to person, place, and time. She appears well-developed and well-nourished. No distress.  Uncomfortable and ill-appearing but nontoxic  HENT:  Head: Normocephalic and atraumatic.  Mildly dry mucous membranes; petechiae on soft palate  Eyes: Conjunctivae are normal. Pupils are equal, round, and reactive to light.  Neck: Normal range of motion. Neck supple.  Cardiovascular: Regular rhythm and normal heart sounds.   No murmur heard. Tachycardiac  Pulmonary/Chest: Effort normal and breath sounds normal. No respiratory distress.  Abdominal: Soft. Bowel sounds are normal. She exhibits no distension. There is no tenderness.  Musculoskeletal: She exhibits no edema.  Neurological: She is alert and oriented to person, place, and time.  Fluent speech  Skin: Skin is warm  and dry.  Psychiatric: She has a normal mood and affect. Judgment normal.  Nursing note and vitals reviewed.   ED Course  Procedures (including critical care time) Labs Review Labs Reviewed  CBC - Abnormal; Notable for the following:    WBC 15.6 (*)    RBC 5.15 (*)    All other components within normal limits  BASIC METABOLIC PANEL - Abnormal; Notable for the following:    Potassium 3.4 (*)    CO2 21 (*)    Glucose, Bld 122 (*)    Calcium 8.8 (*)    All other components within normal limits  INFLUENZA PANEL BY PCR (TYPE A & B, H1N1)  I-STAT CG4 LACTIC ACID, ED    Imaging Review Dg  Chest 2 View  03/25/2015  CLINICAL DATA:  49 year old female with cough, congestion and fatigue for 2 days. EXAM: CHEST  2 VIEW COMPARISON:  None FINDINGS: The cardiomediastinal silhouette is unremarkable. There is no evidence of focal airspace disease, pulmonary edema, suspicious pulmonary nodule/mass, pleural effusion, or pneumothorax. No acute bony abnormalities are identified. IMPRESSION: No active cardiopulmonary disease. Electronically Signed   By: Margarette Canada M.D.   On: 03/25/2015 13:44   I have personally reviewed and evaluated these lab results as part of my medical decision-making.   EKG Interpretation None     Medications  acetaminophen (TYLENOL) tablet 650 mg (650 mg Oral Given 03/25/15 1218)  sodium chloride 0.9 % bolus 1,000 mL (0 mLs Intravenous Stopped 03/25/15 1617)  ondansetron (ZOFRAN) injection 4 mg (4 mg Intravenous Given 03/25/15 1528)  ketorolac (TORADOL) 30 MG/ML injection 30 mg (30 mg Intravenous Given 03/25/15 1527)    MDM   Final diagnoses:  Viral syndrome   patient presents with several days of flulike symptoms. On arrival, patient was tachycardic to 130, temperature 101.3. She was unwell but nontoxic, normal work of breathing, no meningismus. Obtained about lab work which was negative for influenza, WBC 15.6, otherwise unremarkable labs. Gave the patient Tylenol, IV fluid bolus, Zofran, and later Toradol. On reexamination, her fever and tachycardia had improved. She stated that she felt mildly improved. Discussed risks and benefits of Tamiflu; given that she is proximally 3 days into her illness, I do not feel she would have great benefit from this medication. Provided with Ladona Ridgel for her cough and started on supportive care including continued hydration, Tylenol/Motrin as needed, and strict return precautions including any shortness of breath, chest pain, or worsening symptoms. Patient voiced understanding and was discharged in satisfactory  condition.  Sharlett Iles, MD 03/25/15 [phone]

## 2015-03-25 NOTE — Discharge Instructions (Signed)
Viral Infections °A viral infection can be caused by different types of viruses. Most viral infections are not serious and resolve on their own. However, some infections may cause severe symptoms and may lead to further complications. °SYMPTOMS °Viruses can frequently cause: °· Minor sore throat. °· Aches and pains. °· Headaches. °· Runny nose. °· Different types of rashes. °· Watery eyes. °· Tiredness. °· Cough. °· Loss of appetite. °· Gastrointestinal infections, resulting in nausea, vomiting, and diarrhea. °These symptoms do not respond to antibiotics because the infection is not caused by bacteria. However, you might catch a bacterial infection following the viral infection. This is sometimes called a "superinfection." Symptoms of such a bacterial infection may include: °· Worsening sore throat with pus and difficulty swallowing. °· Swollen neck glands. °· Chills and a high or persistent fever. °· Severe headache. °· Tenderness over the sinuses. °· Persistent overall ill feeling (malaise), muscle aches, and tiredness (fatigue). °· Persistent cough. °· Yellow, green, or brown mucus production with coughing. °HOME CARE INSTRUCTIONS  °· Only take over-the-counter or prescription medicines for pain, discomfort, diarrhea, or fever as directed by your caregiver. °· Drink enough water and fluids to keep your urine clear or pale yellow. Sports drinks can provide valuable electrolytes, sugars, and hydration. °· Get plenty of rest and maintain proper nutrition. Soups and broths with crackers or rice are fine. °SEEK IMMEDIATE MEDICAL CARE IF:  °· You have severe headaches, shortness of breath, chest pain, neck pain, or an unusual rash. °· You have uncontrolled vomiting, diarrhea, or you are unable to keep down fluids. °· You or your child has an oral temperature above 102° F (38.9° C), not controlled by medicine. °· Your baby is older than 3 months with a rectal temperature of 102° F (38.9° C) or higher. °· Your baby is 3  months old or younger with a rectal temperature of 100.4° F (38° C) or higher. °MAKE SURE YOU:  °· Understand these instructions. °· Will watch your condition. °· Will get help right away if you are not doing well or get worse. °  °This information is not intended to replace advice given to you by your health care provider. Make sure you discuss any questions you have with your health care provider. °  °Document Released: 12/20/2004 Document Revised: 06/04/2011 Document Reviewed: 08/18/2014 °Elsevier Interactive Patient Education ©2016 [employer] ° °

## 2015-04-07 ENCOUNTER — Encounter: Payer: Self-pay | Admitting: Family

## 2015-04-07 ENCOUNTER — Ambulatory Visit (INDEPENDENT_AMBULATORY_CARE_PROVIDER_SITE_OTHER): Payer: BLUE CROSS/BLUE SHIELD | Admitting: Family

## 2015-04-07 VITALS — BP 130/84 | HR 89 | Temp 97.7°F | Resp 18 | Ht 59.5 in | Wt 168.4 lb

## 2015-04-07 DIAGNOSIS — M25512 Pain in left shoulder: Secondary | ICD-10-CM | POA: Diagnosis not present

## 2015-04-07 DIAGNOSIS — I10 Essential (primary) hypertension: Secondary | ICD-10-CM | POA: Diagnosis not present

## 2015-04-07 NOTE — Assessment & Plan Note (Signed)
Left shoulder pain significantly improved with naproxen and home exercise therapy. Range of motion today is within normal limits compared to the contralateral side and patient reports significant decrease in pain and overall improvement. Continue home exercise therapy. Decreased naproxen to once daily or as needed. No further medical treatment needed at this time. Follow-up if symptoms return.

## 2015-04-07 NOTE — Progress Notes (Signed)
Subjective:    Patient ID: Sherry Carrillo, female    DOB: April 24, 1965, 50 y.o.   MRN: [id number]  Chief Complaint  Patient presents with  . Follow-up    states that the shoulder pain has gotten a lot better, naproxen has helped, hypertension    HPI:  TIAJA RAVITZ is a 50 y.o. female who  has a past medical history of Hypertension; Hyperthyroidism; Abnormal pap (2009); SVD (spontaneous vaginal delivery); and Headache(784.0). and presents today for a follow up office visit.   1.) Hypertension - currently maintained on lisinopril-hydrochlorothiazide. Takes medications prescribed and denies adverse side effects, hypotensive readings or cough. Reports her blood pressure is adequately controlled with current regimen. Denies symptoms of end organ damage.  BP Readings from Last 3 Encounters:  04/07/15 130/84  03/25/15 130/86  03/07/15 138/90    2.)  Left shoulder - previously prescribed naproxen for possible subacromial bursitis/rotator cuff tendinitis. Takes medication as prescribed and denies adverse side effects. Is per is working on exercise program at home and reports that her shoulder is feeling better.  Allergies  Allergen Reactions  . Sulfa Antibiotics Nausea Only     Current Outpatient Prescriptions on File Prior to Visit  Medication Sig Dispense Refill  . benzonatate (TESSALON) 100 MG capsule Take 1 capsule (100 mg total) by mouth every 8 (eight) hours. 12 capsule 0  . lisinopril-hydrochlorothiazide (ZESTORETIC) 20-12.5 MG tablet Take 1 tablet by mouth daily. 90 tablet 0  . naproxen (NAPROSYN) 500 MG tablet Take 1 tablet (500 mg total) by mouth 2 (two) times daily with a meal. 60 tablet 0   No current facility-administered medications on file prior to visit.    Review of Systems  Constitutional: Negative for fever.  Respiratory: Negative for chest tightness.   Cardiovascular: Negative for chest pain, palpitations and leg swelling.      Objective:    BP 130/84 mmHg   Pulse 89  Temp(Src) 97.7 F (36.5 C) (Oral)  Resp 18  Ht 4' 11.5" (1.511 m)  Wt 168 lb 6.4 oz (76.386 kg)  BMI 33.46 kg/m2  SpO2 98% Nursing note and vital signs reviewed.  Physical Exam  Constitutional: She is oriented to person, place, and time. She appears well-developed and well-nourished. No distress.  Cardiovascular: Normal rate, regular rhythm, normal heart sounds and intact distal pulses.   Pulmonary/Chest: Effort normal and breath sounds normal.  Musculoskeletal:  Left shoulder - no obvious deformity, discoloration, or edema noted. No palpable tenderness able to be elicited. Range of motion and Apley's scratch test is normal. Distal pulses, sensation, and reflexes are intact intact and appropriate.  Neurological: She is alert and oriented to person, place, and time.  Skin: Skin is warm and dry.  Psychiatric: She has a normal mood and affect. Her behavior is normal. Judgment and thought content normal.       Assessment & Plan:   Problem List Items Addressed This Visit      Cardiovascular and Mediastinum   Essential hypertension - Primary    Hypertension is well controlled with current regimen and below goal 140/90. Denies adverse side effects. Encouraged to monitor blood pressure at home. Continue current dosage of lisinopril-hydrochlorothiazide. Follow-up in 3 months.        Other   Left shoulder pain    Left shoulder pain significantly improved with naproxen and home exercise therapy. Range of motion today is within normal limits compared to the contralateral side and patient reports significant decrease in pain and  overall improvement. Continue home exercise therapy. Decreased naproxen to once daily or as needed. No further medical treatment needed at this time. Follow-up if symptoms return.

## 2015-04-07 NOTE — Assessment & Plan Note (Signed)
Hypertension is well controlled with current regimen and below goal 140/90. Denies adverse side effects. Encouraged to monitor blood pressure at home. Continue current dosage of lisinopril-hydrochlorothiazide. Follow-up in 3 months.

## 2015-04-07 NOTE — Patient Instructions (Signed)
Thank you for choosing [facility].  Summary/Instructions:  Please continue to take your medications as prescribed.  Decrease your naproxen to once daily or as needed.   Continue home exercise program.  Your prescription(s) have been submitted to your pharmacy or been printed and provided for you. Please take as directed and contact our office if you believe you are having problem(s) with the medication(s) or have any questions.  If your symptoms worsen or fail to improve, please contact our office for further instruction, or in case of emergency go directly to the emergency room at the closest medical facility.

## 2015-04-07 NOTE — Progress Notes (Signed)
Pre visit review using our clinic review tool, if applicable. No additional management support is needed unless otherwise documented below in the visit note. 

## 2015-04-15 ENCOUNTER — Other Ambulatory Visit: Payer: Self-pay

## 2015-04-15 ENCOUNTER — Other Ambulatory Visit: Payer: Self-pay | Admitting: Obstetrics and Gynecology

## 2015-04-15 ENCOUNTER — Other Ambulatory Visit: Payer: Self-pay | Admitting: Family Medicine

## 2015-04-15 DIAGNOSIS — N63 Unspecified lump in unspecified breast: Secondary | ICD-10-CM

## 2015-05-10 ENCOUNTER — Other Ambulatory Visit: Payer: BLUE CROSS/BLUE SHIELD

## 2015-08-02 ENCOUNTER — Inpatient Hospital Stay: Admission: RE | Admit: 2015-08-02 | Payer: BLUE CROSS/BLUE SHIELD | Source: Ambulatory Visit

## 2015-08-02 ENCOUNTER — Other Ambulatory Visit: Payer: BLUE CROSS/BLUE SHIELD

## 2015-09-21 ENCOUNTER — Other Ambulatory Visit: Payer: Self-pay | Admitting: *Deleted

## 2015-09-21 DIAGNOSIS — I10 Essential (primary) hypertension: Secondary | ICD-10-CM

## 2015-09-21 MED ORDER — LISINOPRIL-HYDROCHLOROTHIAZIDE 20-12.5 MG PO TABS: 1.0000 | ORAL_TABLET | Freq: Every day | ORAL | Status: DC

## 2015-09-21 NOTE — Telephone Encounter (Signed)
Left msg on triage stating made appt for 7/12, but she is currently out of her BP med. Needing rx sent to [facility] on [address]...Johny Chess

## 2015-10-05 ENCOUNTER — Ambulatory Visit (INDEPENDENT_AMBULATORY_CARE_PROVIDER_SITE_OTHER): Payer: BLUE CROSS/BLUE SHIELD | Admitting: Family

## 2015-10-05 ENCOUNTER — Encounter: Payer: Self-pay | Admitting: Family

## 2015-10-05 ENCOUNTER — Other Ambulatory Visit (INDEPENDENT_AMBULATORY_CARE_PROVIDER_SITE_OTHER): Payer: BLUE CROSS/BLUE SHIELD

## 2015-10-05 VITALS — BP 132/94 | HR 85 | Temp 98.4°F | Resp 16 | Ht 59.5 in | Wt 169.0 lb

## 2015-10-05 DIAGNOSIS — E669 Obesity, unspecified: Secondary | ICD-10-CM | POA: Diagnosis not present

## 2015-10-05 DIAGNOSIS — I10 Essential (primary) hypertension: Secondary | ICD-10-CM

## 2015-10-05 LAB — COMPREHENSIVE METABOLIC PANEL
ALT: 13 U/L (ref 0–35)
AST: 12 U/L (ref 0–37)
Albumin: 3.8 g/dL (ref 3.5–5.2)
Alkaline Phosphatase: 62 U/L (ref 39–117)
BUN: 15 mg/dL (ref 6–23)
CO2: 27 mEq/L (ref 19–32)
Calcium: 9.3 mg/dL (ref 8.4–10.5)
Chloride: 104 mEq/L (ref 96–112)
Creatinine, Ser: 0.76 mg/dL (ref 0.40–1.20)
GFR: 85.52 mL/min (ref >60.00)
Glucose, Bld: 110 mg/dL — ABNORMAL HIGH (ref 70–99)
Potassium: 4.6 mEq/L (ref 3.5–5.1)
Sodium: 138 mEq/L (ref 135–145)
Total Bilirubin: 0.4 mg/dL (ref 0.2–1.2)
Total Protein: 7.1 g/dL (ref 6.0–8.3)

## 2015-10-05 LAB — LIPID PANEL
Cholesterol: 205 mg/dL — ABNORMAL HIGH (ref 0–200)
HDL: 45.2 mg/dL (ref >39.00)
LDL Cholesterol: 140 mg/dL — ABNORMAL HIGH (ref 0–99)
NonHDL: 160.18
Total CHOL/HDL Ratio: 5
Triglycerides: 102 mg/dL (ref 0.0–149.0)
VLDL: 20.4 mg/dL (ref 0.0–40.0)

## 2015-10-05 LAB — CBC
HCT: 41.6 % (ref 36.0–46.0)
Hemoglobin: 13.9 g/dL (ref 12.0–15.0)
MCHC: 33.4 g/dL (ref 30.0–36.0)
MCV: 83.7 fl (ref 78.0–100.0)
Platelets: 316 10*3/uL (ref 150.0–400.0)
RBC: 4.97 Mil/uL (ref 3.87–5.11)
RDW: 13.4 % (ref 11.5–15.5)
WBC: 7.8 10*3/uL (ref 4.0–10.5)

## 2015-10-05 MED ORDER — LISINOPRIL-HYDROCHLOROTHIAZIDE 20-12.5 MG PO TABS
1.0000 | ORAL_TABLET | Freq: Every day | ORAL | Status: DC
Start: 2015-10-05 — End: 2016-07-11

## 2015-10-05 NOTE — Progress Notes (Signed)
Subjective:    Patient ID: Sherry Carrillo, female    DOB: [birth year], 50 y.o.   MRN: [id number]  Chief Complaint  Patient presents with  . Follow-up    hypertension, would like blood work done    HPI:  Sherry Carrillo is a 50 y.o. female who  has a past medical history of Hypertension; Hyperthyroidism; Abnormal pap (2009); SVD (spontaneous vaginal delivery); and Headache(784.0). and presents today for a follow up office visit.   1.) Hypertension. This is a chronic problem. Currently maintained on lisinopril-hydrochlorothiazide. Reports taking the medication as prescribed without adverse side effects or cough. Does not currently check her blood pressure at home. No hypotensive readings or symptoms of end organ damage. Overdue for an eye exam.   BP Readings from Last 3 Encounters:  10/05/15 132/94  04/07/15 130/84  03/25/15 130/86    Allergies  Allergen Reactions  . Sulfa Antibiotics Nausea Only    Outpatient Prescriptions Prior to Visit  Medication Sig Dispense Refill  . lisinopril-hydrochlorothiazide (ZESTORETIC) 20-12.5 MG tablet Take 1 tablet by mouth daily. Must keep July appt for future refills 30 tablet 0  . benzonatate (TESSALON) 100 MG capsule Take 1 capsule (100 mg total) by mouth every 8 (eight) hours. 12 capsule 0  . naproxen (NAPROSYN) 500 MG tablet Take 1 tablet (500 mg total) by mouth 2 (two) times daily with a meal. 60 tablet 0   No facility-administered medications prior to visit.     Review of Systems  Constitutional: Negative for fever and chills.  Eyes:       Negative for changes in vision  Respiratory: Negative for cough, chest tightness and wheezing.   Cardiovascular: Negative for chest pain, palpitations and leg swelling.  Neurological: Negative for dizziness, weakness and light-headedness.      Objective:    BP 132/94 mmHg  Pulse 85  Temp(Src) 98.4 F (36.9 C) (Oral)  Resp 16  Ht 4' 11.5" (1.511 m)  Wt 169 lb (76.658 kg)  BMI 33.58 kg/m2  SpO2  94% Nursing note and vital signs reviewed.  Physical Exam  Constitutional: She is oriented to person, place, and time. She appears well-developed and well-nourished. No distress.  Cardiovascular: Normal rate, regular rhythm, normal heart sounds, intact distal pulses and normal pulses.   Pulmonary/Chest: Effort normal and breath sounds normal.  Neurological: She is alert and oriented to person, place, and time.  Skin: Skin is warm and dry.  Psychiatric: She has a normal mood and affect. Her behavior is normal. Judgment and thought content normal.       Assessment & Plan:   Problem List Items Addressed This Visit      Cardiovascular and Mediastinum   Essential hypertension - Primary    Hypertension appears adequately controlled with current regimen and no adverse side effects. Readings slightly elevated today however patient has not taken her blood pressure medication today. No symptoms of end organ damage. Obtain CBC, comp [place] metabolic panel, and lipid profile for therapeutic drug monitoring. Encouraged to complete screening eye exam. Continue current dosage of lisinopril-hydrochlorothiazide. Encouraged low-sodium diet and to monitor blood pressure at home. Follow-up pending blood work      Relevant Medications   lisinopril-hydrochlorothiazide (ZESTORETIC) 20-12.5 MG tablet   Other Relevant Orders   CBC   Comprehensive metabolic panel   Lipid panel     Other   Obesity    BMI of 33.58 indicating obesity. Encouraged weight loss of 5-10% of current body weight through nutrition  and physical activity.Recommend increasing physical activity to 30 minutes of moderate level activity daily. Encourage nutritional intake that focuses on nutrient dense foods and is moderate, varied, and balanced and is low in saturated fats and processed/sugary foods. Continue to monitor.      Relevant Orders   Lipid panel       I have discontinued Ms. Zettlemoyer naproxen and benzonatate. I am also having  her maintain her lisinopril-hydrochlorothiazide.   Meds ordered this encounter  Medications  . lisinopril-hydrochlorothiazide (ZESTORETIC) 20-12.5 MG tablet    Sig: Take 1 tablet by mouth daily. Must keep July appt for future refills    Dispense:  90 tablet    Refill:  1    Order Specific Question:  Supervising Provider    Answer:  Pricilla Holm A [id number]     Follow-up: Return in about 3 months (around 01/05/2016), or if symptoms worsen or fail to improve.  Mauricio Po, FNP

## 2015-10-05 NOTE — Assessment & Plan Note (Addendum)
Hypertension appears adequately controlled with current regimen and no adverse side effects. Readings slightly elevated today however patient has not taken her blood pressure medication today. No symptoms of end organ damage. Obtain CBC, comp [place] metabolic panel, and lipid profile for therapeutic drug monitoring. Encouraged to complete screening eye exam. Continue current dosage of lisinopril-hydrochlorothiazide. Encouraged low-sodium diet and to monitor blood pressure at home. Follow-up pending blood work

## 2015-10-05 NOTE — Patient Instructions (Signed)
Thank you for choosing [facility].  Summary/Instructions:  Please continue to take your medications as prescribed.   Your prescription(s) have been submitted to your pharmacy or been printed and provided for you. Please take as directed and contact our office if you believe you are having problem(s) with the medication(s) or have any questions.  Please stop by the lab on the lower level of the building for your blood work. Your results will be released to [place] (or called to you) after review, usually within 72 hours after test completion. If any changes need to be made, you will be notified at that same time.  1. The lab is open from 7:30am to 5:30 pm Monday-Friday  2. No appointment is necessary  3. Fasting (if needed) is 6-8 hours after food and drink; black coffee  and water are okay   If your symptoms worsen or fail to improve, please contact our office for further instruction, or in case of emergency go directly to the emergency room at the closest medical facility.    DASH Eating Plan DASH stands for "Dietary Approaches to Stop Hypertension." The DASH eating plan is a healthy eating plan that has been shown to reduce high blood pressure (hypertension). Additional health benefits may include reducing the risk of type 2 diabetes mellitus, heart disease, and stroke. The DASH eating plan may also help with weight loss. WHAT DO I NEED TO KNOW ABOUT THE DASH EATING PLAN? For the DASH eating plan, you will follow these general guidelines:  Choose foods with a percent daily value for sodium of less than 5% (as listed on the food label).  Use salt-free seasonings or herbs instead of table salt or sea salt.  Check with your health care provider or pharmacist before using salt substitutes.  Eat lower-sodium products, often labeled as "lower sodium" or "no salt added."  Eat fresh foods.  Eat more vegetables, fruits, and low-fat dairy products.  Choose whole grains. Look for the  word "whole" as the first word in the ingredient list.  Choose fish and skinless chicken or [place] more often than red meat. Limit fish, poultry, and meat to 6 oz (170 g) each day.  Limit sweets, desserts, sugars, and sugary drinks.  Choose heart-healthy fats.  Limit cheese to 1 oz (28 g) per day.  Eat more home-cooked food and less restaurant, buffet, and fast food.  Limit fried foods.  Cook foods using methods other than frying.  Limit canned vegetables. If you do use them, rinse them well to decrease the sodium.  When eating at a restaurant, ask that your food be prepared with less salt, or no salt if possible. WHAT FOODS CAN I EAT? Seek help from a dietitian for individual calorie needs. Grains Whole grain or whole wheat bread. Brown rice. Whole grain or whole wheat pasta. Quinoa, bulgur, and whole grain cereals. Low-sodium cereals. Corn or whole wheat flour tortillas. Whole grain cornbread. Whole grain crackers. Low-sodium crackers. Vegetables Fresh or frozen vegetables (raw, steamed, roasted, or grilled). Low-sodium or reduced-sodium tomato and vegetable juices. Low-sodium or reduced-sodium tomato sauce and paste. Low-sodium or reduced-sodium canned vegetables.  Fruits All fresh, canned (in natural juice), or frozen fruits. Meat and Other Protein Products Ground beef (85% or leaner), grass-fed beef, or beef trimmed of fat. Skinless chicken or [place]. Ground chicken or [place]. Pork trimmed of fat. All fish and seafood. Eggs. Dried beans, peas, or lentils. Unsalted nuts and seeds. Unsalted canned beans. Dairy Low-fat dairy products, such as  skim or 1% milk, 2% or reduced-fat cheeses, low-fat ricotta or cottage cheese, or plain low-fat yogurt. Low-sodium or reduced-sodium cheeses. Fats and Oils Tub margarines without trans fats. Light or reduced-fat mayonnaise and salad dressings (reduced sodium). Avocado. Safflower, olive, or canola oils. Natural peanut or almond  butter. Other Unsalted popcorn and pretzels. The items listed above may not be a complete list of recommended foods or beverages. Contact your dietitian for more options. WHAT FOODS ARE NOT RECOMMENDED? Grains White bread. White pasta. White rice. Refined cornbread. Bagels and croissants. Crackers that contain trans fat. Vegetables Creamed or fried vegetables. Vegetables in a cheese sauce. Regular canned vegetables. Regular canned tomato sauce and paste. Regular tomato and vegetable juices. Fruits Dried fruits. Canned fruit in light or heavy syrup. Fruit juice. Meat and Other Protein Products Fatty cuts of meat. Ribs, chicken wings, bacon, sausage, bologna, salami, chitterlings, fatback, hot dogs, bratwurst, and packaged luncheon meats. Salted nuts and seeds. Canned beans with salt. Dairy Whole or 2% milk, cream, half-and-half, and cream cheese. Whole-fat or sweetened yogurt. Full-fat cheeses or blue cheese. Nondairy creamers and whipped toppings. Processed cheese, cheese spreads, or cheese curds. Condiments Onion and garlic salt, seasoned salt, table salt, and sea salt. Canned and packaged gravies. Worcestershire sauce. Tartar sauce. Barbecue sauce. Teriyaki sauce. Soy sauce, including reduced sodium. Steak sauce. Fish sauce. Oyster sauce. Cocktail sauce. Horseradish. Ketchup and mustard. Meat flavorings and tenderizers. Bouillon cubes. Hot sauce. Tabasco sauce. Marinades. Taco seasonings. Relishes. Fats and Oils Butter, stick margarine, lard, shortening, ghee, and bacon fat. Coconut, palm kernel, or palm oils. Regular salad dressings. Other Pickles and olives. Salted popcorn and pretzels. The items listed above may not be a complete list of foods and beverages to avoid. Contact your dietitian for more information. WHERE CAN I FIND MORE INFORMATION? National Heart, Lung, and Blood Institute: travelstabloid.com   This information is not intended to replace  advice given to you by your health care provider. Make sure you discuss any questions you have with your health care provider.   Document Released: 03/01/2011 Document Revised: 04/02/2014 Document Reviewed: 01/14/2013 Elsevier Interactive Patient Education [employer].

## 2015-10-05 NOTE — Assessment & Plan Note (Signed)
BMI of 33.58 indicating obesity. Encouraged weight loss of 5-10% of current body weight through nutrition and physical activity.Recommend increasing physical activity to 30 minutes of moderate level activity daily. Encourage nutritional intake that focuses on nutrient dense foods and is moderate, varied, and balanced and is low in saturated fats and processed/sugary foods. Continue to monitor.

## 2015-10-27 ENCOUNTER — Ambulatory Visit
Admission: RE | Admit: 2015-10-27 | Discharge: 2015-10-27 | Disposition: A | Discharge status: Home / Self Care | Payer: BLUE CROSS/BLUE SHIELD | Source: Ambulatory Visit | Attending: Obstetrics and Gynecology | Admitting: Obstetrics and Gynecology

## 2015-10-27 DIAGNOSIS — N63 Unspecified lump in unspecified breast: Secondary | ICD-10-CM

## 2015-12-17 ENCOUNTER — Ambulatory Visit (HOSPITAL_COMMUNITY)
Admission: EM | Admit: 2015-12-17 | Discharge: 2015-12-17 | Disposition: A | Discharge status: Nursing Home (Includes ICF) | Payer: BLUE CROSS/BLUE SHIELD | Attending: Internal Medicine | Admitting: Internal Medicine

## 2015-12-17 ENCOUNTER — Encounter (HOSPITAL_COMMUNITY): Payer: Self-pay | Admitting: Family Medicine

## 2015-12-17 DIAGNOSIS — M79601 Pain in right arm: Secondary | ICD-10-CM | POA: Diagnosis not present

## 2015-12-17 DIAGNOSIS — R42 Dizziness and giddiness: Secondary | ICD-10-CM

## 2015-12-17 NOTE — ED Provider Notes (Signed)
CSN: [id number]     Arrival date & time 12/17/15  1204 History   None    Chief Complaint  Patient presents with  . Arm Pain   (Consider location/radiation/quality/duration/timing/severity/associated sxs/prior Treatment)  HPI   The patient reports right shoulder and arm pain at 8/10 for the past 3 days.  The patient states this has happened before, but it has always resolved. States she can't remember if it has happened in her right or left arm though.  Denies any known injury.  States she has right arm weakness and reports that she feels as though she is "tipping to the right" as she is walking. Patient describes it as being is "off kilter".  In addition, patient reports right hand and arm numbness and tingling and states she has difficulty holding a pen or pencil to write with. Denies chest pain, shortness of breath, headache. Patient reports dizziness and intermittent difficulty with ambulation.  Past Medical History:  Diagnosis Date  . Abnormal pap 2009   PT HAD SURGERY BUT DON'T KNOW WHAT TYPE  . Headache(784.0)    otc meds prn  . Hypertension   . Hyperthyroidism   . SVD (spontaneous vaginal delivery)    x 2   Past Surgical History:  Procedure Laterality Date  . FOOT SURGERY     left  . TUBAL LIGATION    . WISDOM TOOTH EXTRACTION     Family History  Problem Relation Age of Onset  . Cancer Mother     LUNG  . Hypertension Mother   . Diabetes Mother   . Heart disease Father   . Diabetes Maternal Grandmother   . Heart disease Paternal Grandmother   . Heart disease Paternal Grandfather   . Thyroid disease Sister   . Diabetes Brother    Social History  Substance Use Topics  . Smoking status: Never Smoker  . Smokeless tobacco: Never Used  . Alcohol use No   OB History    Gravida Para Term Preterm AB Living   2         2   SAB TAB Ectopic Multiple Live Births                 Review of Systems  Constitutional: Negative for chills, diaphoresis and fever.  HENT:  Negative.   Eyes: Negative.  Negative for visual disturbance.  Respiratory: Negative.  Negative for cough, chest tightness and shortness of breath.   Cardiovascular: Negative.  Negative for chest pain and leg swelling.  Gastrointestinal: Negative.   Endocrine: Negative.   Genitourinary: Negative.   Musculoskeletal: Positive for gait problem. Negative for neck pain and neck stiffness.       R shoulder pain radiating down R arm.   Skin: Negative.  Negative for color change and rash.  Allergic/Immunologic: Negative.   Neurological: Positive for dizziness, weakness and numbness. Negative for facial asymmetry, speech difficulty and headaches.  Hematological: Negative.   Psychiatric/Behavioral: Negative.  Negative for confusion.    Allergies  Sulfa antibiotics  Home Medications   Prior to Admission medications   Medication Sig Start Date End Date Taking? Authorizing Provider  lisinopril-hydrochlorothiazide (ZESTORETIC) 20-12.5 MG tablet Take 1 tablet by mouth daily. Must keep July appt for future refills 10/05/15   Golden Circle, FNP   Meds Ordered and Administered this Visit  Medications - No data to display  BP 124/87 (BP Location: Left Arm)   Pulse 67   Temp 98 F (36.7 C) (Oral)   Resp  14   LMP 11/10/2011   SpO2 100%  No data found.   Physical Exam  Constitutional: She is oriented to person, place, and time. She appears well-developed and well-nourished. No distress.  HENT:  Head: Normocephalic and atraumatic.  Eyes: Conjunctivae and EOM are normal. Pupils are equal, round, and reactive to light. Right eye exhibits no discharge. Left eye exhibits no discharge. No scleral icterus.  Neck: Normal range of motion. Neck supple.  Negative nuchal rigidity.  Cardiovascular: Normal rate, regular rhythm, normal heart sounds and intact distal pulses.  Exam reveals no gallop and no friction rub.   No murmur heard. Pulmonary/Chest: Effort normal and breath sounds normal. No  respiratory distress. She has no wheezes. She has no rales. She exhibits no tenderness.  Musculoskeletal: She exhibits tenderness.  Lymphadenopathy:    She has no cervical adenopathy.  Neurological: She is alert and oriented to person, place, and time. She has normal strength. She is not disoriented. No cranial nerve deficit or sensory deficit. She exhibits normal muscle tone. She displays a negative Romberg sign. Coordination and gait normal.  Reflex Scores:      Brachioradialis reflexes are 2+ on the right side and 2+ on the left side.      Patellar reflexes are 2+ on the right side and 2+ on the left side. Strength is 5 over 5 to all 4 extremities. The patient is able to climb up to examination table and climb down by herself.  Skin: Skin is warm and dry. Capillary refill takes less than 2 seconds. No rash noted. She is not diaphoretic. No erythema. No pallor.  Psychiatric: She has a normal mood and affect.  Nursing note and vitals reviewed.   Urgent Care Course   Clinical Course    Procedures (including critical care time)  Labs Review Labs Reviewed - No data to display  Imaging Review No results found.    Plan of care was discussed with Dr. Jodi Mourning.   MDM   1. Right arm pain   2. Dizziness    The patient verbalizes understanding and agrees to transfer.       Nehemiah Settle, NP 12/17/15 1346

## 2015-12-17 NOTE — ED Triage Notes (Signed)
Pt here for right arm pain starting in shoulder and radiating down to hand and numbness in fingers. Denies injuries. sts her shoulder hurts to touch.

## 2015-12-19 ENCOUNTER — Other Ambulatory Visit (INDEPENDENT_AMBULATORY_CARE_PROVIDER_SITE_OTHER): Payer: BLUE CROSS/BLUE SHIELD

## 2015-12-19 ENCOUNTER — Encounter: Payer: Self-pay | Admitting: Family

## 2015-12-19 ENCOUNTER — Ambulatory Visit (INDEPENDENT_AMBULATORY_CARE_PROVIDER_SITE_OTHER): Payer: BLUE CROSS/BLUE SHIELD | Admitting: Family

## 2015-12-19 VITALS — BP 120/80 | HR 64 | Temp 98.3°F | Ht 59.5 in | Wt 160.8 lb

## 2015-12-19 DIAGNOSIS — R5383 Other fatigue: Secondary | ICD-10-CM | POA: Insufficient documentation

## 2015-12-19 DIAGNOSIS — M6289 Other specified disorders of muscle: Secondary | ICD-10-CM

## 2015-12-19 DIAGNOSIS — R531 Weakness: Secondary | ICD-10-CM | POA: Insufficient documentation

## 2015-12-19 LAB — COMPREHENSIVE METABOLIC PANEL
ALT: 9 U/L (ref 0–35)
AST: 11 U/L (ref 0–37)
Albumin: 3.7 g/dL (ref 3.5–5.2)
Alkaline Phosphatase: 55 U/L (ref 39–117)
BUN: 21 mg/dL (ref 6–23)
CO2: 28 mEq/L (ref 19–32)
Calcium: 8.6 mg/dL (ref 8.4–10.5)
Chloride: 104 mEq/L (ref 96–112)
Creatinine, Ser: 0.89 mg/dL (ref 0.40–1.20)
GFR: 71.21 mL/min (ref >60.00)
Glucose, Bld: 84 mg/dL (ref 70–99)
Potassium: 3.9 mEq/L (ref 3.5–5.1)
Sodium: 138 mEq/L (ref 135–145)
Total Bilirubin: 0.2 mg/dL (ref 0.2–1.2)
Total Protein: 6.7 g/dL (ref 6.0–8.3)

## 2015-12-19 LAB — IBC PANEL
Iron: 74 ug/dL (ref 42–145)
Saturation Ratios: 23.4 % (ref 20.0–50.0)
Transferrin: 226 mg/dL (ref 212.0–360.0)

## 2015-12-19 LAB — CBC
HCT: 39.9 % (ref 36.0–46.0)
Hemoglobin: 13.4 g/dL (ref 12.0–15.0)
MCHC: 33.7 g/dL (ref 30.0–36.0)
MCV: 84.1 fl (ref 78.0–100.0)
Platelets: 333 10*3/uL (ref 150.0–400.0)
RBC: 4.75 Mil/uL (ref 3.87–5.11)
RDW: 13.3 % (ref 11.5–15.5)
WBC: 9.2 10*3/uL (ref 4.0–10.5)

## 2015-12-19 LAB — TROPONIN I: TNIDX: 0 ug/l (ref 0.00–0.06)

## 2015-12-19 LAB — HEMOGLOBIN A1C: Hgb A1c MFr Bld: 5.7 % (ref 4.6–6.5)

## 2015-12-19 NOTE — Assessment & Plan Note (Signed)
Right-sided weakness for approximately 5 days previously evaluated through urgent care with the recommendation of follow-up in the emergency department with patient declination with the recommendation. Physical exam with continued mild right side weakness and mild right sided facial droop concerning for possible CVA. Does have risk factors including obesity and hypertension with the later being well controlled. Obtain MRI. Possible referral to neurology pending MRI results.

## 2015-12-19 NOTE — Progress Notes (Signed)
Subjective:    Patient ID: Sherry Carrillo, female    DOB: 03/12/1966, 50 y.o.   MRN: [id number]  Chief Complaint  Patient presents with  . Extremity Weakness    HPI:  Sherry Carrillo is a 50 y.o. female who  has a past medical history of Abnormal pap (2009); Headache(784.0); Hypertension; Hyperthyroidism; and SVD (spontaneous vaginal delivery). and presents today for an office visit.  This is a new problem. Recently evaluated in acute care setting for right shoulder/arm pain and feeling like she is "tipping to the right" she is walking as well as being off kilter. She notes right hand and arm numbness and tingling and states she has difficulty holding a pencil. It was recommended that she go to the ED but she did not wish to go. She describes that she has weakness with walking and has felt a lot of fatigue. Describes that she feels like she is slurring her words at time. These symptoms have been going on for about 5 days now. The numbness is located in her arm and leg with weakness. Denies any changes in vision. There are no modifying factors that make it better or worse. No fevers or abdominal pain.    Allergies  Allergen Reactions  . Sulfa Antibiotics Nausea Only      Outpatient Medications Prior to Visit  Medication Sig Dispense Refill  . lisinopril-hydrochlorothiazide (ZESTORETIC) 20-12.5 MG tablet Take 1 tablet by mouth daily. Must keep July appt for future refills 90 tablet 1   No facility-administered medications prior to visit.       Past Surgical History:  Procedure Laterality Date  . FOOT SURGERY     left  . TUBAL LIGATION    . WISDOM TOOTH EXTRACTION        Past Medical History:  Diagnosis Date  . Abnormal pap 2009   PT HAD SURGERY BUT DON'T KNOW WHAT TYPE  . Headache(784.0)    otc meds prn  . Hypertension   . Hyperthyroidism   . SVD (spontaneous vaginal delivery)    x 2      Review of Systems  Constitutional: Positive for fatigue. Negative for chills  and fever.  Eyes: Negative for visual disturbance.  Respiratory: Negative for chest tightness and shortness of breath.   Cardiovascular: Positive for chest pain. Negative for palpitations and leg swelling.  Neurological: Positive for facial asymmetry, speech difficulty and weakness.      Objective:    BP 120/80 (BP Location: Left Arm, Patient Position: Sitting, Cuff Size: Normal)   Pulse 64   Temp 98.3 F (36.8 C) (Oral)   Ht 4' 11.5" (1.511 m)   Wt 160 lb 12 oz (72.9 kg)   LMP 11/10/2011   SpO2 97%   BMI 31.92 kg/m  Nursing note and vital signs reviewed.  Physical Exam  Constitutional: She is oriented to person, place, and time. She appears well-developed and well-nourished. No distress.  Eyes: Conjunctivae and EOM are normal. Pupils are equal, round, and reactive to light.  Cardiovascular: Normal rate, regular rhythm, normal heart sounds and intact distal pulses.   Pulmonary/Chest: Effort normal and breath sounds normal. She has no wheezes. She has no rales. She exhibits no tenderness.  Musculoskeletal:  Gross upper and lower extremity range of motion within normal limits. Right side is mildly decreased in strength compared to the left in all joints. There is mild pronator drift present on the right side. Pulses and sensation are intact and appropriate.  Neurological:  She is alert and oriented to person, place, and time. She has normal reflexes. No cranial nerve deficit or sensory deficit. GCS eye subscore is 4. GCS verbal subscore is 5. GCS motor subscore is 6.  Mild right-sided facial droop noted.  Skin: Skin is warm and dry.  Psychiatric: She has a normal mood and affect. Her behavior is normal. Judgment and thought content normal.       Assessment & Plan:   Problem List Items Addressed This Visit      Nervous and Auditory   Right sided weakness    Right-sided weakness for approximately 5 days previously evaluated through urgent care with the recommendation of follow-up  in the emergency department with patient declination with the recommendation. Physical exam with continued mild right side weakness and mild right sided facial droop concerning for possible CVA. Does have risk factors including obesity and hypertension with the later being well controlled. Obtain MRI. Possible referral to neurology pending MRI results.       Relevant Orders   MR Brain Wo Contrast     Other   Other fatigue - Primary    Feelings of fatigue and being off may possible be related to metabolic causes although cardiac origin remains a concern. Obtain troponin, IBC panel, A1c, [place] metabolic panel, CBC, [id number] to rule out metabolic causes and ACS. Unlikely psychological causes or sleep apnea. Continue to monitor pending blood work results.      Relevant Orders   CBC   Comprehensive metabolic panel   123456 and Folate Panel   Hemoglobin A1c   IBC panel   Troponin I (Completed)    Other Visit Diagnoses   None.      I am having Ms. Ori maintain her lisinopril-hydrochlorothiazide.   Follow-up: Return in about 1 week (around 12/26/2015), or if symptoms worsen or fail to improve.  Mauricio Po, FNP

## 2015-12-19 NOTE — Progress Notes (Signed)
Pre visit review using our clinic review tool, if applicable. No additional management support is needed unless otherwise documented below in the visit note. 

## 2015-12-19 NOTE — Assessment & Plan Note (Signed)
Feelings of fatigue and being off may possible be related to metabolic causes although cardiac origin remains a concern. Obtain troponin, IBC panel, A1c, [place] metabolic panel, CBC, [id number] to rule out metabolic causes and ACS. Unlikely psychological causes or sleep apnea. Continue to monitor pending blood work results.

## 2015-12-19 NOTE — Patient Instructions (Signed)
Thank you for choosing [facility].  SUMMARY AND INSTRUCTIONS:  Medication:  Please continue to take your medication as prescribed.   Labs:  Please stop by the lab on the lower level of the building for your blood work. Your results will be released to [place] (or called to you) after review, usually within 72 hours after test completion. If any changes need to be made, you will be notified at that same time.  1.) The lab is open from 7:30am to 5:30 pm Monday-Friday 2.) No appointment is necessary 3.) Fasting (if needed) is 6-8 hours after food and drink; black coffee and water are okay   Imaging / Radiology:  Please stop by radiology on the basement level of the building for your x-rays. Your results will be released to [place] (or called to you) after review, usually within 72 hours after test completion. If any treatments or changes are necessary, you will be notified at that same time.  Follow up:  If your symptoms worsen or fail to improve, please contact our office for further instruction, or in case of emergency go directly to the emergency room at the closest medical facility.

## 2015-12-20 ENCOUNTER — Telehealth: Payer: Self-pay

## 2015-12-20 LAB — B12 AND FOLATE PANEL
Folate: 5.8 ng/mL — ABNORMAL LOW (ref >5.9)
Vitamin B-12: 492 pg/mL (ref 211–911)

## 2015-12-20 NOTE — Telephone Encounter (Signed)
Please advise 

## 2015-12-20 NOTE — Telephone Encounter (Signed)
Patient called about her lab results. She would like the nurse to follow up with her. She wants to know her results before she does the scan. I don't see any results showing Sherry Carrillo has made a note them yet. Thank you.

## 2015-12-21 NOTE — Telephone Encounter (Signed)
Patient notified of results and will schedule MRI.

## 2015-12-29 ENCOUNTER — Encounter: Payer: Self-pay | Admitting: Internal Medicine

## 2015-12-29 ENCOUNTER — Ambulatory Visit (INDEPENDENT_AMBULATORY_CARE_PROVIDER_SITE_OTHER): Payer: BLUE CROSS/BLUE SHIELD | Admitting: Internal Medicine

## 2015-12-29 DIAGNOSIS — H66002 Acute suppurative otitis media without spontaneous rupture of ear drum, left ear: Secondary | ICD-10-CM | POA: Diagnosis not present

## 2015-12-29 MED ORDER — AMOXICILLIN-POT CLAVULANATE 875-125 MG PO TABS: 1.0000 | ORAL_TABLET | Freq: Two times a day (BID) | ORAL | 0 refills | Status: DC

## 2015-12-29 NOTE — Patient Instructions (Signed)
We have given you a pain medicine shot today called toradol. This should help with the headache.   We have sent in an antibiotic for the ear infection called augmentin. Take 1 pill twice a day for the next 10 days.   You can take tylenol or ibuprofen for the fevers and the pain.   You should not return to work until you are 24 hours since the last fever.

## 2015-12-29 NOTE — Progress Notes (Signed)
Pre visit review using our clinic review tool, if applicable. No additional management support is needed unless otherwise documented below in the visit note. 

## 2015-12-30 DIAGNOSIS — H6692 Otitis media, unspecified, left ear: Secondary | ICD-10-CM | POA: Insufficient documentation

## 2015-12-30 DIAGNOSIS — H66002 Acute suppurative otitis media without spontaneous rupture of ear drum, left ear: Secondary | ICD-10-CM | POA: Diagnosis not present

## 2015-12-30 MED ORDER — KETOROLAC TROMETHAMINE 30 MG/ML IJ SOLN
30.0000 mg | Freq: Once | INTRAMUSCULAR | Status: AC
  Administered 2015-12-30: 30 mg via INTRAMUSCULAR

## 2015-12-30 NOTE — Assessment & Plan Note (Signed)
Rx for augmentin and toradol IM for the pain. It is likely she is having the pain from the fevers. Advised to use tylenol or ibuprofen for fevers.

## 2015-12-30 NOTE — Progress Notes (Signed)
   Subjective:    Patient ID: Sherry Carrillo, female    DOB: 1965/04/14, 50 y.o.   MRN: [id number]  HPI The patient is a 50 YO female coming in for sore throat and left ear pain. She has had fevers in the last day. Started 1 day ago. Has not tried anything for the symptoms. She is also having some muscle aches and nasal drainage. Sick contacts at work.   Review of Systems  Constitutional: Positive for activity change, chills, fatigue and fever. Negative for appetite change.  HENT: Positive for congestion, ear pain, rhinorrhea and sore throat. Negative for ear discharge, postnasal drip, sinus pressure and trouble swallowing.   Eyes: Negative.   Respiratory: Negative for cough, shortness of breath and wheezing.   Cardiovascular: Negative for chest pain, palpitations and leg swelling.  Gastrointestinal: Negative.   Musculoskeletal: Positive for myalgias.      Objective:   Physical Exam  Constitutional: She appears well-developed and well-nourished.  Mild distress  HENT:  Left ear TM with cloudy fluid and bulging, right TM normal. Oropharynx with redness and clear drainage.   Eyes: EOM are normal.  Neck: Normal range of motion. No thyromegaly present.  Cardiovascular: Normal rate and regular rhythm.   Pulmonary/Chest: Breath sounds normal. No respiratory distress. She has no wheezes.  Abdominal: Soft. She exhibits no distension. There is no tenderness. There is no rebound.  Musculoskeletal: She exhibits tenderness.  Diffuse muscular tenderness.   Lymphadenopathy:    She has no cervical adenopathy.  Skin: Skin is warm and dry.   Vitals:   12/29/15 1610  BP: 134/88  Pulse: (!) 124  Temp: 100.3 F (37.9 C)      Assessment & Plan:  Toradol IM 30 mg given at visit.

## 2015-12-30 NOTE — Addendum Note (Signed)
Addended by: Lyman Bishop on: 12/30/2015 08:11 AM   Modules accepted: Orders

## 2016-01-04 ENCOUNTER — Inpatient Hospital Stay: Admission: RE | Admit: 2016-01-04 | Payer: BLUE CROSS/BLUE SHIELD | Source: Ambulatory Visit

## 2016-01-04 ENCOUNTER — Telehealth: Payer: Self-pay | Admitting: Family

## 2016-01-04 NOTE — Telephone Encounter (Signed)
FYI:  Patient did not show for appt today with [facility].

## 2016-01-04 NOTE — Telephone Encounter (Signed)
Noted  

## 2016-04-16 ENCOUNTER — Emergency Department
Admission: EM | Admit: 2016-04-16 | Discharge: 2016-04-16 | Disposition: A | Discharge status: Home / Self Care | Payer: BLUE CROSS/BLUE SHIELD | Attending: Emergency Medicine | Admitting: Emergency Medicine

## 2016-04-16 ENCOUNTER — Encounter: Payer: Self-pay | Admitting: Emergency Medicine

## 2016-04-16 DIAGNOSIS — J029 Acute pharyngitis, unspecified: Secondary | ICD-10-CM | POA: Diagnosis present

## 2016-04-16 DIAGNOSIS — I10 Essential (primary) hypertension: Secondary | ICD-10-CM | POA: Insufficient documentation

## 2016-04-16 DIAGNOSIS — J101 Influenza due to other identified influenza virus with other respiratory manifestations: Secondary | ICD-10-CM | POA: Insufficient documentation

## 2016-04-16 LAB — COMPREHENSIVE METABOLIC PANEL
ALT: 83 U/L — ABNORMAL HIGH (ref 14–54)
AST: 44 U/L — ABNORMAL HIGH (ref 15–41)
Albumin: 3.7 g/dL (ref 3.5–5.0)
Alkaline Phosphatase: 84 U/L (ref 38–126)
Anion gap: 9 (ref 5–15)
BUN: 13 mg/dL (ref 6–20)
CO2: 22 mmol/L (ref 22–32)
Calcium: 8.5 mg/dL — ABNORMAL LOW (ref 8.9–10.3)
Chloride: 103 mmol/L (ref 101–111)
Creatinine, Ser: 0.77 mg/dL (ref 0.44–1.00)
GFR calc Af Amer: >60 mL/min (ref >60)
GFR calc non Af Amer: >60 mL/min (ref >60)
Glucose, Bld: 120 mg/dL — ABNORMAL HIGH (ref 65–99)
Potassium: 3.6 mmol/L (ref 3.5–5.1)
Sodium: 134 mmol/L — ABNORMAL LOW (ref 135–145)
Total Bilirubin: 0.5 mg/dL (ref 0.3–1.2)
Total Protein: 7.2 g/dL (ref 6.5–8.1)

## 2016-04-16 LAB — URINALYSIS, COMPLETE (UACMP) WITH MICROSCOPIC
Bacteria, UA: NONE SEEN
Bilirubin Urine: NEGATIVE
Glucose, UA: NEGATIVE mg/dL
Ketones, ur: 80 mg/dL — AB
Leukocytes, UA: NEGATIVE
Nitrite: NEGATIVE
Protein, ur: 30 mg/dL — AB
Specific Gravity, Urine: 1.025 (ref 1.005–1.030)
pH: 5 (ref 5.0–8.0)

## 2016-04-16 LAB — LIPASE, BLOOD: Lipase: 16 U/L (ref 11–51)

## 2016-04-16 LAB — INFLUENZA PANEL BY PCR (TYPE A & B)
Influenza A By PCR: NEGATIVE
Influenza B By PCR: POSITIVE — AB

## 2016-04-16 LAB — CBC
HCT: 39.3 % (ref 35.0–47.0)
Hemoglobin: 13.4 g/dL (ref 12.0–16.0)
MCH: 28.7 pg (ref 26.0–34.0)
MCHC: 34.2 g/dL (ref 32.0–36.0)
MCV: 84.2 fL (ref 80.0–100.0)
Platelets: 213 10*3/uL (ref 150–440)
RBC: 4.67 MIL/uL (ref 3.80–5.20)
RDW: 13.3 % (ref 11.5–14.5)
WBC: 9.9 10*3/uL (ref 3.6–11.0)

## 2016-04-16 MED ORDER — ONDANSETRON HCL 4 MG/2ML IJ SOLN
4.0000 mg | Freq: Once | INTRAMUSCULAR | Status: AC
  Administered 2016-04-16: 4 mg via INTRAVENOUS
  Filled 2016-04-16: qty 2

## 2016-04-16 MED ORDER — ONDANSETRON HCL 4 MG PO TABS: 4.0000 mg | ORAL_TABLET | Freq: Three times a day (TID) | ORAL | 0 refills | Status: DC | PRN

## 2016-04-16 MED ORDER — GUAIFENESIN-CODEINE 100-10 MG/5ML PO SYRP: 5.0000 mL | ORAL_SOLUTION | Freq: Three times a day (TID) | ORAL | 0 refills | Status: DC | PRN

## 2016-04-16 MED ORDER — OSELTAMIVIR PHOSPHATE 75 MG PO CAPS
75.0000 mg | ORAL_CAPSULE | Freq: Once | ORAL | Status: AC
  Administered 2016-04-16: 75 mg via ORAL
  Filled 2016-04-16: qty 1

## 2016-04-16 MED ORDER — ACETAMINOPHEN 325 MG PO TABS
650.0000 mg | ORAL_TABLET | Freq: Once | ORAL | Status: AC | PRN
  Administered 2016-04-16: 650 mg via ORAL
  Filled 2016-04-16: qty 2

## 2016-04-16 MED ORDER — IBUPROFEN 800 MG PO TABS
800.0000 mg | ORAL_TABLET | Freq: Once | ORAL | Status: AC
  Administered 2016-04-16: 800 mg via ORAL
  Filled 2016-04-16: qty 1

## 2016-04-16 MED ORDER — SODIUM CHLORIDE 0.9 % IV BOLUS (SEPSIS)
1000.0000 mL | Freq: Once | INTRAVENOUS | Status: AC
  Administered 2016-04-16: 1000 mL via INTRAVENOUS

## 2016-04-16 NOTE — ED Triage Notes (Signed)
[facility] with flu-like symptoms: n/v/d/cough/ear pain, nasal congestion, fever. Pt has had symptoms since Saturday. Pt alert & oriented, moaning during triage.

## 2016-04-16 NOTE — Discharge Instructions (Signed)
Take tylenol and ibuprofen for pain and/or fever. Follow up with your FNP for symptoms that are not improving over the next few days. Return to the ER for symptoms that change or worsen if you are unable to schedule an appointment.

## 2016-04-16 NOTE — ED Provider Notes (Signed)
[facility] Emergency Department Provider Note  ____________________________________________  Time seen: Approximately 8:12 PM  I have reviewed the triage vital signs and the nursing notes.   HISTORY  Chief Complaint Fever; Sore Throat; and Emesis   HPI Sherry Carrillo is a 51 y.o. female who presents to the emergency department for evaluation of nausea, vomiting, cough, ear ache, nasal congestion, headache, and fever x 2 days. Vomiting and diarrhea started last night. She has taken tylenol today.    Past Medical History:  Diagnosis Date  . Abnormal pap 2009   PT HAD SURGERY BUT DON'T KNOW WHAT TYPE  . Headache(784.0)    otc meds prn  . Hypertension   . Hyperthyroidism   . SVD (spontaneous vaginal delivery)    x 2    Patient Active Problem List   Diagnosis Date Noted  . Left otitis media 12/30/2015  . Right sided weakness 12/19/2015  . Other fatigue 12/19/2015  . Obesity 10/05/2015  . Left shoulder pain 03/07/2015  . Essential hypertension 03/07/2015    Past Surgical History:  Procedure Laterality Date  . FOOT SURGERY     left  . TUBAL LIGATION    . WISDOM TOOTH EXTRACTION      Prior to Admission medications   Medication Sig Start Date End Date Taking? Authorizing Provider  amoxicillin-clavulanate (AUGMENTIN) 875-125 MG tablet Take 1 tablet by mouth 2 (two) times daily. 12/29/15   Hoyt Koch, MD  lisinopril-hydrochlorothiazide (ZESTORETIC) 20-12.5 MG tablet Take 1 tablet by mouth daily. Must keep July appt for future refills 10/05/15   Golden Circle, FNP    Allergies Sulfa antibiotics  Family History  Problem Relation Age of Onset  . Cancer Mother     LUNG  . Hypertension Mother   . Diabetes Mother   . Heart disease Father   . Diabetes Maternal Grandmother   . Heart disease Paternal Grandmother   . Heart disease Paternal Grandfather   . Thyroid disease Sister   . Diabetes Brother     Social History Social History   Substance Use Topics  . Smoking status: Never Smoker  . Smokeless tobacco: Never Used  . Alcohol use No    Review of Systems Constitutional: Positive for fever/chills ENT: Positive for sore throat. Cardiovascular: Denies chest pain. Respiratory: Negative shortness of breath. Positive for cough. Gastrointestinal: Positive for nausea,  Positive for vomiting.  Positive for diarrhea.  Musculoskeletal: Positive for for body aches Skin: Negative for rash. Neurological: Positive for headaches ____________________________________________   PHYSICAL EXAM:  VITAL SIGNS: ED Triage Vitals  Enc Vitals Group     BP 04/16/16 1815 (!) 146/87     Pulse Rate 04/16/16 1815 (!) 135     Resp --      Temp 04/16/16 1815 (!) 103 F (39.4 C)     Temp Source 04/16/16 1815 Oral     SpO2 04/16/16 1815 100 %     Weight 04/16/16 1815 155 lb (70.3 kg)     Height 04/16/16 1815 4\' 11"  (1.499 m)     Head Circumference --      Peak Flow --      Pain Score 04/16/16 1816 9     Pain Loc --      Pain Edu? --      Excl. in [place]? --     Constitutional: Alert and oriented. Acutly ill appearing and in no acute distress. Eyes: Conjunctivae are normal. EOMI. Ears: Bilateral TM with serous fluid,  no OM Nose: Pansinus congestion; clear rhinnorhea. Mouth/Throat: Mucous membranes are moist.  Oropharynx erythematous. Tonsils appear without exudate. Neck: No stridor.  Lymphatic: Bilateral anterior cervical lymphadenopathy. Cardiovascular: Normal rate, regular rhythm. Grossly normal heart sounds.  Good peripheral circulation. Respiratory: Normal respiratory effort.  No retractions. Breath sounds clear to auscultation. Gastrointestinal: Soft and nontender.  Musculoskeletal: FROM x 4 extremities.  Neurologic:  Normal speech and language.  Skin:  Skin is warm, dry and intact. No rash noted. Psychiatric: Mood and affect are normal. Speech and behavior are normal.  ____________________________________________    LABS (all labs ordered are listed, but only abnormal results are displayed)  Labs Reviewed  COMPREHENSIVE METABOLIC PANEL - Abnormal; Notable for the following:       Result Value   Sodium 134 (*)    Glucose, Bld 120 (*)    Calcium 8.5 (*)    AST 44 (*)    ALT 83 (*)    All other components within normal limits  INFLUENZA PANEL BY PCR (TYPE A & B) - Abnormal; Notable for the following:    Influenza B By PCR POSITIVE (*)    All other components within normal limits  LIPASE, BLOOD  CBC  URINALYSIS, COMPLETE (UACMP) WITH MICROSCOPIC   ____________________________________________  EKG  Not indicated. ____________________________________________  RADIOLOGY  Not indicated. ____________________________________________   PROCEDURES  Procedure(s) performed: None  Critical Care performed: No  ____________________________________________   INITIAL IMPRESSION / ASSESSMENT AND PLAN / ED COURSE     Pertinent labs & imaging results that were available during my care of the patient were reviewed by me and considered in my medical decision making (see chart for details).   51 year old female presenting for influenza-like illness that was confirmed as influenza A by PCR. She will receive IV fluids, ibuprofen, ondansetron, and Tamiflu. Plan to discharge home after fever and heart rate are reduced.  Patient discharged home with prescriptions for Robitussin AC and Zofran. She was encouraged to increase fluids, take Tylenol and ibuprofen for body aches and fever and to follow-up with her primary care provider for symptoms that are not improving over the next few days. She is instructed to return to the emergency department for symptoms that change or worsen if she is unable schedule an appointment. ____________________________________________   FINAL CLINICAL IMPRESSION(S) / ED DIAGNOSES  Final diagnoses:  None    Note:  This document was prepared using Dragon voice  recognition software and may include unintentional dictation errors.     Victorino Dike, FNP 04/17/16 [id number]    Daymon Larsen, MD 04/17/16 [phone]

## 2016-04-16 NOTE — ED Notes (Signed)
Nonproductive cough, fever, chills since Saturday.

## 2016-04-16 NOTE — ED Triage Notes (Signed)
Pt sent for evaluation by [facility]. NP there reports flu symptoms, NVD, fever, chills, tachycardia and tachypnea. NP sent for possible dehydration.

## 2016-04-16 NOTE — ED Notes (Signed)

## 2016-04-17 ENCOUNTER — Ambulatory Visit: Payer: BLUE CROSS/BLUE SHIELD | Admitting: Family

## 2016-04-17 MED ORDER — OSELTAMIVIR PHOSPHATE 75 MG PO CAPS: 75.0000 mg | ORAL_CAPSULE | Freq: Two times a day (BID) | ORAL | 0 refills | Status: AC

## 2016-05-24 ENCOUNTER — Telehealth: Payer: Self-pay | Admitting: Family

## 2016-05-24 MED ORDER — SCOPOLAMINE 1 MG/3DAYS TD PT72: 1.0000 | MEDICATED_PATCH | TRANSDERMAL | 0 refills | Status: DC

## 2016-05-24 NOTE — Telephone Encounter (Signed)
Pt called in said that she is leaving for a cruise  tomorrow and would like to have the patches called in today   [place]

## 2016-05-24 NOTE — Telephone Encounter (Signed)
Medication sent.

## 2016-06-14 ENCOUNTER — Ambulatory Visit: Payer: BLUE CROSS/BLUE SHIELD | Admitting: Family

## 2016-06-14 DIAGNOSIS — Z0289 Encounter for other administrative examinations: Secondary | ICD-10-CM

## 2016-07-11 ENCOUNTER — Ambulatory Visit (INDEPENDENT_AMBULATORY_CARE_PROVIDER_SITE_OTHER): Payer: BLUE CROSS/BLUE SHIELD | Admitting: Family

## 2016-07-11 ENCOUNTER — Encounter: Payer: Self-pay | Admitting: Family

## 2016-07-11 DIAGNOSIS — I10 Essential (primary) hypertension: Secondary | ICD-10-CM

## 2016-07-11 MED ORDER — SCOPOLAMINE 1 MG/3DAYS TD PT72: 1.0000 | MEDICATED_PATCH | TRANSDERMAL | 0 refills | Status: DC

## 2016-07-11 MED ORDER — LISINOPRIL-HYDROCHLOROTHIAZIDE 20-12.5 MG PO TABS: 1.0000 | ORAL_TABLET | Freq: Every day | ORAL | 1 refills | Status: DC

## 2016-07-11 MED ORDER — LISINOPRIL-HYDROCHLOROTHIAZIDE 20-12.5 MG PO TABS: 1.0000 | ORAL_TABLET | Freq: Every day | ORAL | 5 refills | Status: DC

## 2016-07-11 NOTE — Progress Notes (Signed)
Subjective:    Patient ID: Sherry Carrillo, female    DOB: [birth year], 51 y.o.   MRN: [id number]  Chief Complaint  Patient presents with  . Follow-up    BP    HPI:  Sherry Carrillo is a 51 y.o. female who  has a past medical history of Abnormal pap (2009); Headache(784.0); Hypertension; Hyperthyroidism; and SVD (spontaneous vaginal delivery). and presents today for a follow up office visit.  Hypertension - Currently maintained on lisinopril-hctz. Reports taking the medication as prescribed and denies adverse side effects. Not currently checking blood pressures at home. Uses headaches as a gauge and no recent headings. Working on following a low sodium diet and has cut back a lot. Working on going to [employer]. Denies worst headache of life with no symptoms of end organ damage. Last eye exam in July 2017.  BP Readings from Last 3 Encounters:  07/11/16 124/82  04/16/16 126/72  12/29/15 134/88     Allergies  Allergen Reactions  . Sulfa Antibiotics Nausea Only      Outpatient Medications Prior to Visit  Medication Sig Dispense Refill  . lisinopril-hydrochlorothiazide (ZESTORETIC) 20-12.5 MG tablet Take 1 tablet by mouth daily. Must keep July appt for future refills 90 tablet 1  . ondansetron (ZOFRAN) 4 MG tablet Take 1 tablet (4 mg total) by mouth every 8 (eight) hours as needed for nausea or vomiting. 20 tablet 0  . amoxicillin-clavulanate (AUGMENTIN) 875-125 MG tablet Take 1 tablet by mouth 2 (two) times daily. 20 tablet 0  . guaiFENesin-codeine (ROBITUSSIN AC) 100-10 MG/5ML syrup Take 5 mLs by mouth 3 (three) times daily as needed for cough. 120 mL 0  . scopolamine (TRANSDERM-SCOP) 1 MG/3DAYS Place 1 patch (1.5 mg total) onto the skin every 3 (three) days. 10 patch 0   No facility-administered medications prior to visit.      Review of Systems  Constitutional: Negative for chills and fever.  Eyes:       Negative for changes in vision  Respiratory: Negative for cough, chest  tightness and wheezing.   Cardiovascular: Negative for chest pain, palpitations and leg swelling.  Neurological: Negative for dizziness, weakness and light-headedness.      Objective:    BP 124/82 (BP Location: Left Arm, Patient Position: Sitting, Cuff Size: Normal)   Pulse 75   Temp 98.2 F (36.8 C) (Oral)   Resp 16   Ht 4\' 11"  (1.499 m)   Wt 160 lb 9.6 oz (72.8 kg)   LMP 11/12/2011   SpO2 98%   BMI 32.44 kg/m  Nursing note and vital signs reviewed.  Physical Exam  Constitutional: She is oriented to person, place, and time. She appears well-developed and well-nourished. No distress.  Cardiovascular: Normal rate, regular rhythm, normal heart sounds and intact distal pulses.   Pulmonary/Chest: Effort normal and breath sounds normal.  Neurological: She is alert and oriented to person, place, and time.  Skin: Skin is warm and dry.  Psychiatric: She has a normal mood and affect. Her behavior is normal. Judgment and thought content normal.       Assessment & Plan:   Problem List Items Addressed This Visit      Cardiovascular and Mediastinum   Essential hypertension    Blood pressure well-controlled and below goal of 140/90 with current medication regimen and no adverse side effects. Denies worst headache of life with no symptoms of end organ damage noted on physical exam. Continue current dosage of lisinopril-hydrochlorothiazide. Continue monitor blood pressure  at home and follow low-sodium diet.      Relevant Medications   lisinopril-hydrochlorothiazide (ZESTORETIC) 20-12.5 MG tablet       I have discontinued Ms. Melena amoxicillin-clavulanate, ondansetron, and guaiFENesin-codeine. I am also having her maintain her scopolamine and lisinopril-hydrochlorothiazide.   Meds ordered this encounter  Medications  . DISCONTD: lisinopril-hydrochlorothiazide (ZESTORETIC) 20-12.5 MG tablet    Sig: Take 1 tablet by mouth daily. Must keep July appt for future refills    Dispense:   90 tablet    Refill:  1    Order Specific Question:   Supervising Provider    Answer:   Pricilla Holm A [[id number]]  . scopolamine (TRANSDERM-SCOP) 1 MG/3DAYS    Sig: Place 1 patch (1.5 mg total) onto the skin every 3 (three) days.    Dispense:  2 patch    Refill:  0    Order Specific Question:   Supervising Provider    Answer:   Pricilla Holm A [[id number]]  . lisinopril-hydrochlorothiazide (ZESTORETIC) 20-12.5 MG tablet    Sig: Take 1 tablet by mouth daily. Must keep July appt for future refills    Dispense:  30 tablet    Refill:  5    Please discontinue previous prescription.    Order Specific Question:   Supervising Provider    Answer:   Pricilla Holm A [[id number]]     Follow-up: Return in about 6 months (around 01/10/2017).  Mauricio Po, FNP

## 2016-07-11 NOTE — Patient Instructions (Addendum)
Thank you for choosing [facility].  SUMMARY AND INSTRUCTIONS:  Please continue to take your medication as prescribed.  Work on stretches and exercises for the knee.  Ice x 20 minutes every 2 hours as needed and after activity.  Medication:  Your prescription(s) have been submitted to your pharmacy or been printed and provided for you. Please take as directed and contact our office if you believe you are having problem(s) with the medication(s) or have any questions.  Follow up:  If your symptoms worsen or fail to improve, please contact our office for further instruction, or in case of emergency go directly to the emergency room at the closest medical facility.

## 2016-07-11 NOTE — Assessment & Plan Note (Signed)
Blood pressure well-controlled and below goal of 140/90 with current medication regimen and no adverse side effects. Denies worst headache of life with no symptoms of end organ damage noted on physical exam. Continue current dosage of lisinopril-hydrochlorothiazide. Continue monitor blood pressure at home and follow low-sodium diet.

## 2016-08-21 ENCOUNTER — Encounter: Payer: Self-pay | Admitting: Family

## 2016-09-04 ENCOUNTER — Encounter: Payer: Self-pay | Admitting: Family

## 2016-09-04 ENCOUNTER — Ambulatory Visit (INDEPENDENT_AMBULATORY_CARE_PROVIDER_SITE_OTHER): Payer: BLUE CROSS/BLUE SHIELD | Admitting: Family

## 2016-09-04 VITALS — BP 124/74 | HR 82 | Temp 98.4°F | Resp 16 | Ht 59.0 in | Wt 162.8 lb

## 2016-09-04 DIAGNOSIS — R51 Headache: Secondary | ICD-10-CM | POA: Diagnosis not present

## 2016-09-04 DIAGNOSIS — L255 Unspecified contact dermatitis due to plants, except food: Secondary | ICD-10-CM | POA: Diagnosis not present

## 2016-09-04 DIAGNOSIS — R519 Headache, unspecified: Secondary | ICD-10-CM

## 2016-09-04 MED ORDER — HYDROCORTISONE 2.5 % EX CREA: TOPICAL_CREAM | Freq: Two times a day (BID) | CUTANEOUS | 0 refills | Status: DC

## 2016-09-04 MED ORDER — PREDNISONE 20 MG PO TABS: ORAL_TABLET | ORAL | 0 refills | Status: DC

## 2016-09-04 MED ORDER — METHYLPREDNISOLONE ACETATE 80 MG/ML IJ SUSP
80.0000 mg | Freq: Once | INTRAMUSCULAR | Status: AC
  Administered 2016-09-04: 80 mg via INTRAMUSCULAR

## 2016-09-04 MED ORDER — KETOROLAC TROMETHAMINE 60 MG/2ML IM SOLN
60.0000 mg | Freq: Once | INTRAMUSCULAR | Status: AC
  Administered 2016-09-04: 60 mg via INTRAMUSCULAR

## 2016-09-04 NOTE — Assessment & Plan Note (Signed)
Symptoms and exam consistent with contact dermatitis secondary to contact with the plan. In office injection of 80 mg of Depo-Medrol provided. Start prednisone taper and hydrocortisone cream. Encouraged cool compresses to assist with symptom relief. Follow-up if symptoms worsen or do not improve.

## 2016-09-04 NOTE — Assessment & Plan Note (Signed)
New-onset headache with concern for migraines as she has previous history of migraine headaches. Neurological exam normal and cranial nerves intact. Treat conservatively with over-the-counter medications as needed for symptom relief and supportive care. In office injection of Toradol provided. Follow-up if symptoms worsen or do not improve.

## 2016-09-04 NOTE — Patient Instructions (Addendum)
Thank you for choosing [facility].  SUMMARY AND INSTRUCTIONS:  Start taking the prednisone tomorrow.   Hydrocortisone cream as needed for itching.   Cool compress for itching.  If your symptoms worsen or you develop chest pain or shortness of breath go to the emergency room.   Medication:  Your prescription(s) have been submitted to your pharmacy or been printed and provided for you. Please take as directed and contact our office if you believe you are having problem(s) with the medication(s) or have any questions.  Follow up:  If your symptoms worsen or fail to improve, please contact our office for further instruction, or in case of emergency go directly to the emergency room at the closest medical facility.     Poison Oak Dermatitis Poison oak dermatitis is redness and soreness (inflammation) of the skin. It is caused by a chemical that is on the leaves of the poison oak plant. You may also have itching, a rash, and blisters. Symptoms often clear up in 1-2 weeks. You may get this condition by touching a poison oak plant. You can also get it by touching something that has the chemical on it. This may include animals or objects that have come in contact with the plant. Follow these instructions at home: General instructions  Take or apply over-the-counter and prescription medicines only as told by your doctor.  If you touch poison oak, wash your skin with soap and cold water right away.  Use hydrocortisone creams or calamine lotion as needed to help with itching.  Take oatmeal baths as needed. Use colloidal oatmeal. You can get this at a pharmacy or grocery store. Follow the instructions on the package.  Do not scratch or rub your skin.  While you have the rash, wash your clothes right after you wear them. Prevention   Know what poison oak looks like so you can avoid it. This plant has three leaves with flowering branches on a single stem. The leaves are fuzzy. They  have a toothlike edge.  If you have touched poison oak, wash with soap and water right away. Be sure to wash under your fingernails.  When hiking or camping, wear long pants, a long-sleeved shirt, tall socks, and hiking boots. You can also use a lotion on your skin that helps to prevent contact with the chemical on the plant.  If you think that your clothes or outdoor gear came in contact with poison oak, rinse them off with a garden hose before you bring them inside your house. Contact a doctor if:  You have open sores in the rash area.  You have more redness, swelling, or pain in the affected area.  You have redness that spreads beyond the rash area.  You have fluid, blood, or pus coming from the affected area.  You have a fever.  You have a rash over a large area of your body.  You have a rash on your eyes, mouth, or genitals.  Your rash does not improve after a few days. Get help right away if:  Your face swells or your eyes swell shut.  You have trouble breathing.  You have trouble swallowing. This information is not intended to replace advice given to you by your health care provider. Make sure you discuss any questions you have with your health care provider. Document Released: 04/14/2010 Document Revised: 08/18/2015 Document Reviewed: 08/18/2014 Elsevier Interactive Patient Education  [employer].

## 2016-09-04 NOTE — Progress Notes (Signed)
Subjective:    Patient ID: Sherry Carrillo, female    DOB: 1966-03-20, 51 y.o.   MRN: [id number]  Chief Complaint  Patient presents with  . Rash    has rash all over legs that she thinks could be poison ivy, swelling and itching and very painful, has had severe headaches since the rash occured thought it was BP related.     HPI:  Sherry Carrillo is a 51 y.o. female who  has a past medical history of Abnormal pap (2009); Headache(784.0); Hypertension; Hyperthyroidism; and SVD (spontaneous vaginal delivery). and presents today for an acute office visit.  This is a new problem. Associated symptom of a rash, pain and itching has been going on for about 2 weeks and thinks she may have come in contact with poison oak or poison ivy when cutting down weeds. Modifying factors include Calamine lotion and sprays which have not helped very much. Has also noted an increase in her headaches which she is unsure are related. Frequency of the headaches are worse as well as intensity. Usually responds well to [facility] powder, but has only helped a little. Denies worst headache of life.   Allergies  Allergen Reactions  . Sulfa Antibiotics Nausea Only      Outpatient Medications Prior to Visit  Medication Sig Dispense Refill  . lisinopril-hydrochlorothiazide (ZESTORETIC) 20-12.5 MG tablet Take 1 tablet by mouth daily. Must keep July appt for future refills 30 tablet 5  . scopolamine (TRANSDERM-SCOP) 1 MG/3DAYS Place 1 patch (1.5 mg total) onto the skin every 3 (three) days. 2 patch 0   No facility-administered medications prior to visit.       Past Surgical History:  Procedure Laterality Date  . FOOT SURGERY     left  . TUBAL LIGATION    . WISDOM TOOTH EXTRACTION        Past Medical History:  Diagnosis Date  . Abnormal pap 2009   PT HAD SURGERY BUT DON'T KNOW WHAT TYPE  . Headache(784.0)    otc meds prn  . Hypertension   . Hyperthyroidism   . SVD (spontaneous vaginal delivery)    x 2       Review of Systems  Constitutional: Negative for chills and fever.  Respiratory: Negative for chest tightness and shortness of breath.   Cardiovascular: Negative for chest pain, palpitations and leg swelling.  Skin: Positive for rash.  Neurological: Positive for headaches.      Objective:    BP 124/74 (BP Location: Left Arm, Patient Position: Sitting, Cuff Size: Large)   Pulse 82   Temp 98.4 F (36.9 C) (Oral)   Resp 16   Ht 4\' 11"  (1.499 m)   Wt 162 lb 12.8 oz (73.8 kg)   LMP 11/12/2011   SpO2 98%   BMI 32.88 kg/m  Nursing note and vital signs reviewed.  Physical Exam  Constitutional: She is oriented to person, place, and time. She appears well-developed and well-nourished. No distress.  Eyes: Conjunctivae and EOM are normal. Pupils are equal, round, and reactive to light.  Cardiovascular: Normal rate, regular rhythm, normal heart sounds and intact distal pulses.   Pulmonary/Chest: Effort normal and breath sounds normal.  Neurological: She is alert and oriented to person, place, and time. No cranial nerve deficit.  Skin: Skin is warm and dry. Rash (Sporadic vesicular rash located around her bilateral lower extremities with redness and appears with various stages of healing following itching) noted.  Psychiatric: She has a normal mood and  affect. Her behavior is normal. Judgment and thought content normal.       Assessment & Plan:   Problem List Items Addressed This Visit      Musculoskeletal and Integument   Contact dermatitis due to plant - Primary    Symptoms and exam consistent with contact dermatitis secondary to contact with the plan. In office injection of 80 mg of Depo-Medrol provided. Start prednisone taper and hydrocortisone cream. Encouraged cool compresses to assist with symptom relief. Follow-up if symptoms worsen or do not improve.      Relevant Medications   ketorolac (TORADOL) injection 60 mg (Completed)   methylPREDNISolone acetate (DEPO-MEDROL)  injection 80 mg (Completed)     Other   New onset of headaches    New-onset headache with concern for migraines as she has previous history of migraine headaches. Neurological exam normal and cranial nerves intact. Treat conservatively with over-the-counter medications as needed for symptom relief and supportive care. In office injection of Toradol provided. Follow-up if symptoms worsen or do not improve.      Relevant Medications   ketorolac (TORADOL) injection 60 mg (Completed)       I have discontinued Ms. Tift scopolamine. I am also having her start on predniSONE and hydrocortisone. Additionally, I am having her maintain her lisinopril-hydrochlorothiazide. We administered ketorolac and methylPREDNISolone acetate.   Meds ordered this encounter  Medications  . predniSONE (DELTASONE) 20 MG tablet    Sig: Take 3 tablets by mouth daily for 4 days, 2 tablets by mouth daily for 4 days, then 1 tablet by mouth daily for 4 days    Dispense:  24 tablet    Refill:  0    Order Specific Question:   Supervising Provider    Answer:   Pricilla Holm A [[id number]]  . hydrocortisone 2.5 % cream    Sig: Apply topically 2 (two) times daily.    Dispense:  30 g    Refill:  0    Order Specific Question:   Supervising Provider    Answer:   Pricilla Holm A [[id number]]  . ketorolac (TORADOL) injection 60 mg  . methylPREDNISolone acetate (DEPO-MEDROL) injection 80 mg     Follow-up: Return if symptoms worsen or fail to improve.  Mauricio Po, FNP

## 2016-11-28 ENCOUNTER — Other Ambulatory Visit: Payer: Self-pay | Admitting: Family

## 2016-11-28 DIAGNOSIS — I10 Essential (primary) hypertension: Secondary | ICD-10-CM

## 2017-04-16 ENCOUNTER — Other Ambulatory Visit: Payer: Self-pay

## 2017-04-16 ENCOUNTER — Ambulatory Visit (INDEPENDENT_AMBULATORY_CARE_PROVIDER_SITE_OTHER): Payer: BLUE CROSS/BLUE SHIELD | Admitting: Family Medicine

## 2017-04-16 ENCOUNTER — Ambulatory Visit (INDEPENDENT_AMBULATORY_CARE_PROVIDER_SITE_OTHER): Payer: BLUE CROSS/BLUE SHIELD

## 2017-04-16 ENCOUNTER — Encounter: Payer: Self-pay | Admitting: Family Medicine

## 2017-04-16 VITALS — BP 132/84 | HR 86 | Temp 98.4°F | Ht 59.0 in | Wt 171.0 lb

## 2017-04-16 DIAGNOSIS — Z114 Encounter for screening for human immunodeficiency virus [HIV]: Secondary | ICD-10-CM | POA: Diagnosis not present

## 2017-04-16 DIAGNOSIS — I10 Essential (primary) hypertension: Secondary | ICD-10-CM

## 2017-04-16 DIAGNOSIS — M79645 Pain in left finger(s): Secondary | ICD-10-CM

## 2017-04-16 DIAGNOSIS — Z13220 Encounter for screening for lipoid disorders: Secondary | ICD-10-CM | POA: Diagnosis not present

## 2017-04-16 MED ORDER — LISINOPRIL-HYDROCHLOROTHIAZIDE 20-12.5 MG PO TABS: 1.0000 | ORAL_TABLET | Freq: Every day | ORAL | 1 refills | Status: DC

## 2017-04-16 NOTE — Assessment & Plan Note (Signed)
Patient may have very subtle findings of degenerative changes in her finger based on my read-we will await radiology read.  No obvious bony abnormalities.  Reassured patient.  Continue with watchful waiting.  We will check ESR and CRP to rule out inflammatory processes such as RA.  Patient does not have any pain-declined anti-inflammatories at this point.  Strict return precautions reviewed.

## 2017-04-16 NOTE — Progress Notes (Signed)
    Subjective:  Sherry Carrillo is a 52 y.o. female who presents today with a chief complaint of HTN and to transfer care to this office.Marland Kitchen   HPI:  Hypertension, established problem, Stable BP Readings from Last 3 Encounters:  04/16/17 132/84  09/04/16 124/74  07/11/16 124/82   Home BP monitoring: None Current Medications: Lisinopril-HCTZ 20-12 0.51 tablet daily, compliant without side effects. Interim History: Overall she is doing well.  No side effects from medications.  Does not regularly exercise.  Does not follow any specific diet.  ROS: Denies any chest pain, shortness of breath, dyspnea on exertion, leg edema.   Left Ring Finger Swelling, New Issue Started about 2 months ago.  Stable over that time.  No trauma or other obvious precipitating event.  Difficult for her to fully form a fist.  She has not tried any medications.  She has noticed that is been more difficult to wear her wedding rings on that hand.  Only mild pain when she tries to flex her finger.  She noticed some pain when she hit the tip of her finger yesterday.  No prior history of trauma.  No other obvious alleviating or aggravating factors.  ROS: Per HPI  PMH: She reports that  has never smoked. she has never used smokeless tobacco. She reports that she does not drink alcohol or use drugs.  Objective:  Physical Exam: BP 132/84 (BP Location: Left Arm, Patient Position: Sitting, Cuff Size: Normal)   Pulse 86   Temp 98.4 F (36.9 C) (Oral)   Ht '4\' 11"'$  (1.499 m)   Wt 171 lb (77.6 kg)   LMP 11/12/2011   SpO2 99%   BMI 34.54 kg/m   Gen: NAD, resting comfortably CV: RRR with no murmurs appreciated Pulm: NWOB, CTAB with no crackles, wheezes, or rhonchi MSK:  -Left hand: Left PIP joint of ring finger with mild edema.  Decreased ability to flex secondary to swelling.  Neurovascularly intact distally.  Nontender to palpation.  Assessment/Plan:  Essential hypertension At goal.  Continue current dose of  lisinopril-HCTZ.  Patient will return soon for CMET.  She will follow-up with me in 6 months.  Discussed home blood pressure monitoring with goal 140/90 or less.  Also discussed lifestyle modifications including low-sodium diet and regular exercise.  Pain of finger of left hand Patient may have very subtle findings of degenerative changes in her finger based on my read-we will await radiology read.  No obvious bony abnormalities.  Reassured patient.  Continue with watchful waiting.  We will check ESR and CRP to rule out inflammatory processes such as RA.  Patient does not have any pain-declined anti-inflammatories at this point.  Strict return precautions reviewed.  Preventative healthcare Check HIV antibody and lipid panel with next blood draw.  Discussed colon cancer screening-patient will contact her insurance to see if she is eligible for cologuard.  [name]. [name], MD 04/16/2017 2:44 PM

## 2017-04-16 NOTE — Patient Instructions (Signed)
Your blood pressure looks good today.  Please continue blood pressure medication.  Please come back soon to have blood work done.  We will also check your blood sugar level as well as your cholesterol level.  I think you probably have a little bit of arthritis in your finger-this should gradually improve over the next few weeks.  If your symptoms worsen or do not improve soon, please let us know.  Please limit your sodium intake to 1800 mg or less daily.  For your bones, you should ideally be on vitamin D 800 international units daily and calcium 1000 mg daily.  Please come back to see me in 6 months, or sooner as needed.  Take care, Dr. [name]

## 2017-04-16 NOTE — Assessment & Plan Note (Signed)
At goal.  Continue current dose of lisinopril-HCTZ.  Patient will return soon for CMET.  She will follow-up with me in 6 months.  Discussed home blood pressure monitoring with goal 140/90 or less.  Also discussed lifestyle modifications including low-sodium diet and regular exercise.

## 2017-04-25 ENCOUNTER — Other Ambulatory Visit: Payer: BLUE CROSS/BLUE SHIELD

## 2017-05-01 ENCOUNTER — Other Ambulatory Visit: Payer: BLUE CROSS/BLUE SHIELD

## 2017-05-04 ENCOUNTER — Emergency Department
Admission: EM | Admit: 2017-05-04 | Discharge: 2017-05-04 | Disposition: A | Discharge status: Home / Self Care | Payer: BLUE CROSS/BLUE SHIELD | Attending: Emergency Medicine | Admitting: Emergency Medicine

## 2017-05-04 ENCOUNTER — Emergency Department: Payer: BLUE CROSS/BLUE SHIELD

## 2017-05-04 ENCOUNTER — Encounter: Payer: Self-pay | Admitting: Emergency Medicine

## 2017-05-04 ENCOUNTER — Other Ambulatory Visit: Payer: Self-pay

## 2017-05-04 DIAGNOSIS — Y929 Unspecified place or not applicable: Secondary | ICD-10-CM | POA: Diagnosis not present

## 2017-05-04 DIAGNOSIS — I10 Essential (primary) hypertension: Secondary | ICD-10-CM | POA: Diagnosis not present

## 2017-05-04 DIAGNOSIS — Y999 Unspecified external cause status: Secondary | ICD-10-CM | POA: Insufficient documentation

## 2017-05-04 DIAGNOSIS — W109XXA Fall (on) (from) unspecified stairs and steps, initial encounter: Secondary | ICD-10-CM | POA: Insufficient documentation

## 2017-05-04 DIAGNOSIS — Y9301 Activity, walking, marching and hiking: Secondary | ICD-10-CM | POA: Diagnosis not present

## 2017-05-04 DIAGNOSIS — Z79899 Other long term (current) drug therapy: Secondary | ICD-10-CM | POA: Insufficient documentation

## 2017-05-04 DIAGNOSIS — S92154A Nondisplaced avulsion fracture (chip fracture) of right talus, initial encounter for closed fracture: Secondary | ICD-10-CM

## 2017-05-04 DIAGNOSIS — S99911A Unspecified injury of right ankle, initial encounter: Secondary | ICD-10-CM | POA: Diagnosis present

## 2017-05-04 DIAGNOSIS — S93401A Sprain of unspecified ligament of right ankle, initial encounter: Secondary | ICD-10-CM

## 2017-05-04 MED ORDER — HYDROCODONE-ACETAMINOPHEN 5-325 MG PO TABS: 1.0000 | ORAL_TABLET | Freq: Four times a day (QID) | ORAL | 0 refills | Status: DC | PRN

## 2017-05-04 MED ORDER — NAPROXEN 500 MG PO TABS: 500.0000 mg | ORAL_TABLET | Freq: Two times a day (BID) | ORAL | 0 refills | Status: DC

## 2017-05-04 MED ORDER — HYDROCODONE-ACETAMINOPHEN 5-325 MG PO TABS
1.0000 | ORAL_TABLET | Freq: Once | ORAL | Status: AC
  Administered 2017-05-04: 1 via ORAL
  Filled 2017-05-04: qty 1

## 2017-05-04 NOTE — Discharge Instructions (Signed)
Follow-up with your primary care provider if any continued pain medication is needed.  Begin taking Norco 1 every 6 hours as needed for moderate pain and naproxen 500 mg twice daily for pain and inflammation.  You cannot drive while taking Norco.  Also follow-up with Dr. Elvina Mattes who is the podiatrist on call at [facility].  You will need to call and make an appointment.  Use crutches for support until you are able to bear weight without pain.

## 2017-05-04 NOTE — ED Notes (Signed)
Pt reports ankle pain after missing a couple of steps last night, swelling noted, pt states her toes feel numb and foot feels cold.

## 2017-05-04 NOTE — ED Notes (Signed)
The patient was given crutches to aid with her mobility. She was able to demonstrate using them without any issues. She understood all instructions about using them.

## 2017-05-04 NOTE — ED Provider Notes (Signed)
[facility] Emergency Department Provider Note  ___________________________________________   First MD Initiated Contact with Patient 05/04/17 0940     (approximate)  I have reviewed the triage vital signs and the nursing notes.   HISTORY  Chief Complaint Ankle Pain  HPI Sherry Carrillo is a 52 y.o. female is here with complaint of right ankle pain.  Patient states that she twisted her ankle when she fell down a couple steps yesterday.  She has continued to have pain especially with bearing weight.  Ankle has swollen.  She has taken over-the-counter medication without any relief of her pain.  She denies any head injury or any other injuries during this fall.  She rates her pain as a 9 out of 10.   Past Medical History:  Diagnosis Date  . Abnormal pap 2009   PT HAD SURGERY BUT DON'T KNOW WHAT TYPE  . Headache(784.0)    otc meds prn  . Hypertension   . Hyperthyroidism   . SVD (spontaneous vaginal delivery)    x 2    Patient Active Problem List   Diagnosis Date Noted  . Pain of finger of left hand 04/16/2017  . New onset of headaches 09/04/2016  . Other fatigue 12/19/2015  . Obesity 10/05/2015  . Left shoulder pain 03/07/2015  . Essential hypertension 03/07/2015    Past Surgical History:  Procedure Laterality Date  . FOOT SURGERY     left  . TUBAL LIGATION    . WISDOM TOOTH EXTRACTION      Prior to Admission medications   Medication Sig Start Date End Date Taking? Authorizing Provider  HYDROcodone-acetaminophen (NORCO/VICODIN) 5-325 MG tablet Take 1 tablet by mouth every 6 (six) hours as needed for moderate pain. 05/04/17   Johnn Hai, PA-C  lisinopril-hydrochlorothiazide (PRINZIDE,ZESTORETIC) 20-12.5 MG tablet Take 1 tablet by mouth daily. 11/28/16   Golden Circle, FNP  lisinopril-hydrochlorothiazide (ZESTORETIC) 20-12.5 MG tablet Take 1 tablet by mouth daily. Must keep July appt for future refills 04/16/17   Vivi Barrack, MD    naproxen (NAPROSYN) 500 MG tablet Take 1 tablet (500 mg total) by mouth 2 (two) times daily with a meal. 05/04/17   Johnn Hai, PA-C    Allergies Sulfa antibiotics  Family History  Problem Relation Age of Onset  . Cancer Mother        LUNG  . Hypertension Mother   . Diabetes Mother   . Heart disease Father   . Diabetes Maternal Grandmother   . Heart disease Paternal Grandmother   . Heart disease Paternal Grandfather   . Thyroid disease Sister   . Diabetes Brother     Social History Social History   Tobacco Use  . Smoking status: Never Smoker  . Smokeless tobacco: Never Used  Substance Use Topics  . Alcohol use: No  . Drug use: No    Review of Systems Constitutional: No fever/chills Eyes: No visual changes. ENT: No trauma Cardiovascular: Denies chest pain. Respiratory: Denies shortness of breath. Gastrointestinal:  No nausea, no vomiting.  Musculoskeletal: Positive for right ankle pain. Skin: Negative for abrasions or bruises. Neurological: Negative for headaches, focal weakness or numbness. ___________________________________________   PHYSICAL EXAM:  VITAL SIGNS: ED Triage Vitals  Enc Vitals Group     BP 05/04/17 0921 138/83     Pulse Rate 05/04/17 0921 78     Resp 05/04/17 0921 18     Temp 05/04/17 0921 97.9 F (36.6 C)  Temp Source 05/04/17 0921 Oral     SpO2 05/04/17 0921 99 %     Weight 05/04/17 0922 180 lb (81.6 kg)     Height 05/04/17 0922 4\' 11"  (1.499 m)     Head Circumference --      Peak Flow --      Pain Score 05/04/17 0921 9     Pain Loc --      Pain Edu? --      Excl. in [place]? --    Constitutional: Alert and oriented. Well appearing and in no acute distress. Eyes: Conjunctivae are normal.  Head: Atraumatic. Neck: No stridor.   Cardiovascular: Normal rate, regular rhythm. Grossly normal heart sounds.  Good peripheral circulation. Respiratory: Normal respiratory effort.  No retractions. Lungs CTAB. Musculoskeletal: right ankle  shows moderate soft tissue swelling on the lateral aspect.  There is marked tenderness on the lateral malleolus.  Skin is intact.  Range of motion is restricted secondary to pain.  Patient is able to flex and extend with increased pain.  Motor sensory function intact distal to her injury.  Patient is nontender on palpation of her left knee.  Pulses present. Neurologic:  Normal speech and language. No gross focal neurologic deficits are appreciated.  Skin:  Skin is warm, dry and intact.  No ecchymosis or abrasions are seen. Psychiatric: Mood and affect are normal. Speech and behavior are normal.  ____________________________________________   LABS (all labs ordered are listed, but only abnormal results are displayed)  Labs Reviewed - No data to display  RADIOLOGY  ED MD interpretation: Small avulsion fracture noted on lateral aspect of x-ray on the right ankle.  Official radiology report(s): Dg Ankle Complete Right  Result Date: 05/04/2017 CLINICAL DATA:  52 year old female with right ankle pain after missing a step last night EXAM: RIGHT ANKLE - COMPLETE 3+ VIEW COMPARISON:  None. FINDINGS: Soft tissue swelling about the lateral malleolus. There is a linear density adjacent to the talar neck visualized only on the AP view. No other fracture or bone fragment identified. No ankle joint effusion. IMPRESSION: Possible tiny capsular avulsion fracture from the lateral aspect of the talar neck. Electronically Signed   By: Jacqulynn Cadet M.D.   On: 05/04/2017 10:03    ____________________________________________   PROCEDURES  Procedure(s) performed:   .Splint Application Date/Time: 10/02/8919 12:49 PM Performed by: Suszanne Conners, NT Authorized by: Johnn Hai, PA-C   Consent:    Consent obtained:  Verbal   Consent given by:  Patient   Risks discussed:  Pain and swelling   Alternatives discussed:  Referral Procedure details:    Laterality:  Right   Location:  Ankle   Ankle:   R ankle   Splint type:  Ankle stirrup   Supplies:  Prefabricated splint Post-procedure details:    Pain:  Unchanged   Sensation:  Unchanged   Patient tolerance of procedure:  Tolerated well, no immediate complications    Critical Care performed: No  ____________________________________________   INITIAL IMPRESSION / ASSESSMENT AND PLAN / ED COURSE  As part of my medical decision making, I reviewed the following data within the electronic MEDICAL RECORD NUMBER Notes from prior ED visits and [place] Controlled Substance Database  Patient was given Norco while in the department along with being discharged with prescription for the same.  A prescription for naproxen 500 mg twice daily was also given.  Patient is instructed to ice and elevate.  She was also given a set of crutches along  with her stirrup splint.  She will follow-up with Dr. Elvina Mattes if any continued problems or concerns. ____________________________________________   FINAL CLINICAL IMPRESSION(S) / ED DIAGNOSES  Final diagnoses:  Closed nondisplaced avulsion fracture of right talus, initial encounter  Sprain of right ankle, unspecified ligament, initial encounter     ED Discharge Orders        Ordered    HYDROcodone-acetaminophen (NORCO/VICODIN) 5-325 MG tablet  Every 6 hours PRN     05/04/17 1018    naproxen (NAPROSYN) 500 MG tablet  2 times daily with meals     05/04/17 1018       Note:  This document was prepared using Dragon voice recognition software and may include unintentional dictation errors.    Johnn Hai, PA-C 05/04/17 1251    Schuyler Amor, MD 05/04/17 1332

## 2017-05-04 NOTE — ED Triage Notes (Signed)
L ankle pain since twisted coming down stairs yesterday

## 2017-09-03 ENCOUNTER — Encounter: Payer: Self-pay | Admitting: Family Medicine

## 2017-09-03 ENCOUNTER — Ambulatory Visit (INDEPENDENT_AMBULATORY_CARE_PROVIDER_SITE_OTHER): Payer: BLUE CROSS/BLUE SHIELD | Admitting: Family Medicine

## 2017-09-03 VITALS — BP 128/78 | HR 76 | Temp 98.9°F | Ht 59.0 in | Wt 172.2 lb

## 2017-09-03 DIAGNOSIS — R1012 Left upper quadrant pain: Secondary | ICD-10-CM | POA: Diagnosis not present

## 2017-09-03 DIAGNOSIS — R51 Headache: Secondary | ICD-10-CM | POA: Diagnosis not present

## 2017-09-03 DIAGNOSIS — I10 Essential (primary) hypertension: Secondary | ICD-10-CM | POA: Diagnosis not present

## 2017-09-03 DIAGNOSIS — Z1211 Encounter for screening for malignant neoplasm of colon: Secondary | ICD-10-CM | POA: Diagnosis not present

## 2017-09-03 DIAGNOSIS — Z13220 Encounter for screening for lipoid disorders: Secondary | ICD-10-CM

## 2017-09-03 DIAGNOSIS — R1011 Right upper quadrant pain: Secondary | ICD-10-CM | POA: Diagnosis not present

## 2017-09-03 DIAGNOSIS — R519 Headache, unspecified: Secondary | ICD-10-CM

## 2017-09-03 LAB — COMPREHENSIVE METABOLIC PANEL
ALT: 14 U/L (ref 0–35)
AST: 16 U/L (ref 0–37)
Albumin: 3.8 g/dL (ref 3.5–5.2)
Alkaline Phosphatase: 64 U/L (ref 39–117)
BUN: 14 mg/dL (ref 6–23)
CO2: 28 mEq/L (ref 19–32)
Calcium: 9.6 mg/dL (ref 8.4–10.5)
Chloride: 102 mEq/L (ref 96–112)
Creatinine, Ser: 0.72 mg/dL (ref 0.40–1.20)
GFR: 90.33 mL/min (ref >60.00)
Glucose, Bld: 110 mg/dL — ABNORMAL HIGH (ref 70–99)
Potassium: 5 mEq/L (ref 3.5–5.1)
Sodium: 138 mEq/L (ref 135–145)
Total Bilirubin: 0.4 mg/dL (ref 0.2–1.2)
Total Protein: 7 g/dL (ref 6.0–8.3)

## 2017-09-03 LAB — CBC
HCT: 41.5 % (ref 36.0–46.0)
Hemoglobin: 13.7 g/dL (ref 12.0–15.0)
MCHC: 32.9 g/dL (ref 30.0–36.0)
MCV: 86.2 fl (ref 78.0–100.0)
Platelets: 313 10*3/uL (ref 150.0–400.0)
RBC: 4.82 Mil/uL (ref 3.87–5.11)
RDW: 13.3 % (ref 11.5–15.5)
WBC: 6.9 10*3/uL (ref 4.0–10.5)

## 2017-09-03 LAB — LIPID PANEL
Cholesterol: 200 mg/dL (ref 0–200)
HDL: 43.3 mg/dL (ref >39.00)
LDL Cholesterol: 135 mg/dL — ABNORMAL HIGH (ref 0–99)
NonHDL: 156.98
Total CHOL/HDL Ratio: 5
Triglycerides: 109 mg/dL (ref 0.0–149.0)
VLDL: 21.8 mg/dL (ref 0.0–40.0)

## 2017-09-03 LAB — H. PYLORI ANTIBODY, IGG: H Pylori IgG: NEGATIVE

## 2017-09-03 LAB — LIPASE: Lipase: 22 U/L (ref 11.0–59.0)

## 2017-09-03 MED ORDER — METOPROLOL SUCCINATE ER 50 MG PO TB24: 50.0000 mg | ORAL_TABLET | Freq: Every day | ORAL | 3 refills | Status: DC

## 2017-09-03 MED ORDER — SUMATRIPTAN SUCCINATE 50 MG PO TABS: 50.0000 mg | ORAL_TABLET | ORAL | 0 refills | Status: DC | PRN

## 2017-09-03 MED ORDER — PANTOPRAZOLE SODIUM 40 MG PO TBEC: 40.0000 mg | DELAYED_RELEASE_TABLET | Freq: Every day | ORAL | 3 refills | Status: DC

## 2017-09-03 MED ORDER — GI COCKTAIL ~~LOC~~: 30.0000 mL | Freq: Once | ORAL | Status: DC

## 2017-09-03 NOTE — Addendum Note (Signed)
Addended by: Kayren Eaves T on: 09/03/2017 01:01 PM   Modules accepted: Orders

## 2017-09-03 NOTE — Progress Notes (Signed)
   Subjective:  Sherry Carrillo is a 52 y.o. female who presents today for same-day appointment with a chief complaint of abdominal pain.   HPI:  Abdominal Pain, acute problem Started 2 weeks ago. Stable over that time. Located in her mid upper abdomen. Pain occurs randomly without clear precipitating event.  No nausea or vomiting.  No fevers or chills.  No diarrhea or constipation.  Occasionally has reflux type symptoms that have worsened.  She takes [facility] Powder for her headaches nearly daily and is worried she may have a stomach ulcer. No obvious alleviating or aggravating factors.   Headaches, chronic problem, worsening Several year history. Takes BC powder for her headaches. Has seen neurologist in the past and was diagnosed with a  Headache disorder.  Severity of her headaches have been stable, however she has been getting headaches nearly every day over the past couple of weeks.  This happens frequently over the course of the year.  As noted above, patient takes BC powder nearly every day.  No weakness or numbness.  No syncope.  Occasional vision changes associate with headaches.  Occasional nausea.  ROS: Per HPI  PMH: She reports that she has never smoked. She has never used smokeless tobacco. She reports that she does not drink alcohol or use drugs.  Objective:  Physical Exam: BP 128/78 (BP Location: Left Arm, Patient Position: Sitting, Cuff Size: Normal)   Pulse 76   Temp 98.9 F (37.2 C) (Oral)   Ht 4\' 11"  (1.499 m)   Wt 172 lb 3.2 oz (78.1 kg)   LMP 11/12/2011   SpO2 98%   BMI 34.78 kg/m   Wt Readings from Last 3 Encounters:  09/03/17 172 lb 3.2 oz (78.1 kg)  05/04/17 180 lb (81.6 kg)  04/16/17 171 lb (77.6 kg)  Gen: NAD, resting comfortably CV: RRR with no murmurs appreciated Pulm: NWOB, CTAB with no crackles, wheezes, or rhonchi GI: Normal bowel sounds present. Soft, Nontender, Nondistended. Neuro: Cranial nerves II through XII intact.  Strength 5 out of 5 in upper lower  extremities.  Assessment/Plan:  Abdominal pain, bilateral upper quadrant Concern for NSAID overuse gastritis.  Patient was given a GI cocktail today with relief of her symptoms.  Possibly peptic ulcer disease.  We will check blood work today including lipase, H. pylori, and CMET to rule out other causes.  Advised patient to stop all NSAID medications.  Will start Protonix as well.  She does not have any red flag signs or symptoms, however given concern for peptic ulcer disease, recommended referral to GI for endoscopic evaluation, however patient declined.  Discussed strict return precautions and reasons to return to care.  Will need GI referral if symptoms persist despite PPI and avoidance of NSAIDs.  Headache disorder History is most consistent with migraine disorder.  Her neurological exam today is normal and she has had a neurological evaluation in the past.  She may have a component of medication overuse headache as well given that she uses BC powders nearly daily.  Will start preventative therapy with metoprolol succinate 50 mg daily.  We will also give a prescription for Imitrex to use as needed.  She will follow-up with me in about 6 weeks, or sooner as needed.  Preventive healthcare Cologuard ordered today.  [name]. [name], MD 09/03/2017 9:24 AM

## 2017-09-03 NOTE — Patient Instructions (Signed)
It was very nice to see you today!  I think you have stomach irritation due to your [facility] powders.  Please stop taking BC powder and any other anti-inflammatory.  We will start you on an acid blocker medication and check blood work today.  Please let me know if your symptoms worsen or do not improve over the next couple of weeks.  Please start the metoprolol for your headaches.  Please use the Imitrex as needed for severe migraines.  Take care, Dr [name]

## 2017-09-03 NOTE — Assessment & Plan Note (Signed)
History is most consistent with migraine disorder.  Her neurological exam today is normal and she has had a neurological evaluation in the past.  She may have a component of medication overuse headache as well given that she uses BC powders nearly daily.  Will start preventative therapy with metoprolol succinate 50 mg daily.  We will also give a prescription for Imitrex to use as needed.  She will follow-up with me in about 6 weeks, or sooner as needed.

## 2017-09-03 NOTE — Assessment & Plan Note (Signed)
Concern for NSAID overuse gastritis.  Patient was given a GI cocktail today with relief of her symptoms.  Possibly peptic ulcer disease.  We will check blood work today including lipase, H. pylori, and CMET to rule out other causes.  Advised patient to stop all NSAID medications.  Will start Protonix as well.  She does not have any red flag signs or symptoms, however given concern for peptic ulcer disease, recommended referral to GI for endoscopic evaluation, however patient declined.  Discussed strict return precautions and reasons to return to care.  Will need GI referral if symptoms persist despite PPI and avoidance of NSAIDs.

## 2017-09-05 ENCOUNTER — Encounter: Payer: Self-pay | Admitting: Family Medicine

## 2017-09-05 DIAGNOSIS — E119 Type 2 diabetes mellitus without complications: Secondary | ICD-10-CM | POA: Insufficient documentation

## 2017-09-05 DIAGNOSIS — E785 Hyperlipidemia, unspecified: Secondary | ICD-10-CM | POA: Insufficient documentation

## 2017-09-05 DIAGNOSIS — R739 Hyperglycemia, unspecified: Secondary | ICD-10-CM | POA: Insufficient documentation

## 2017-10-14 ENCOUNTER — Ambulatory Visit: Payer: BLUE CROSS/BLUE SHIELD | Admitting: Family Medicine

## 2017-10-14 DIAGNOSIS — Z0289 Encounter for other administrative examinations: Secondary | ICD-10-CM

## 2017-10-16 ENCOUNTER — Encounter: Payer: Self-pay | Admitting: Family Medicine

## 2017-10-18 ENCOUNTER — Telehealth: Payer: Self-pay | Admitting: Family Medicine

## 2017-10-18 NOTE — Telephone Encounter (Signed)
Copied from [place] [id number]. Topic: Inquiry >> Oct 18, 2017  2:32 PM Mylinda Latina, NT wrote: Reason for CRM: patient called and states she has a question about her medication metoprolol succinate (TOPROL-XL) 50 MG 24 hr tablet , pantoprazole (PROTONIX) 40 MG tablet. Please call back   CB# [phone]

## 2017-10-21 NOTE — Telephone Encounter (Signed)
Pt calling asking for clarification on how to take medications. Pt was seen on 09/03/17 and was prescribed metoprolol,pantoprazole and sumatriptan. Pt states that during the OV she explained that she was taking BCs to stop headaches and was advised to stop taking this due to potentially damaging the stomach lining. Pt states she did ask for a medication that she could take on a daily basis to prevent headaches and thought she was prescribed pantoprazole for headaches.Pt states she has not been taking sumatriptan because she does not have migraines.  Pt states she did take Metoprolol for 2 weeks but stopped taking it due to a client telling her that the medication was used for BP. Pt states she was now aware that Dr. [name] had prescribed a new BP medication and stopped taking the medication. Pt states she hardly ever gets a headache now with taking pantoprazole and when she does it's "barely there". Informed pt that Metoprolol succinate 50mg  tablet was prescribed as a preventative for headaches to be taken on a daily basis and sumatriptan as needed. per notes of Dr. [name] on 09/03/17. Pt verbalized understanding and states she will start to take Metoprolol again. Pt scheduled for follow up visit on 11/11/17 and states that she was not aware that she had an appt previously scheduled on 10/14/17.

## 2017-11-11 ENCOUNTER — Ambulatory Visit: Payer: BLUE CROSS/BLUE SHIELD | Admitting: Family Medicine

## 2017-11-11 ENCOUNTER — Encounter: Payer: Self-pay | Admitting: Family Medicine

## 2017-11-11 DIAGNOSIS — Z0289 Encounter for other administrative examinations: Secondary | ICD-10-CM

## 2018-01-06 ENCOUNTER — Encounter: Payer: Self-pay | Admitting: Family Medicine

## 2018-01-06 ENCOUNTER — Ambulatory Visit (INDEPENDENT_AMBULATORY_CARE_PROVIDER_SITE_OTHER): Payer: BLUE CROSS/BLUE SHIELD | Admitting: Family Medicine

## 2018-01-06 ENCOUNTER — Other Ambulatory Visit: Payer: Self-pay

## 2018-01-06 VITALS — BP 122/74 | HR 90 | Temp 98.4°F | Ht 59.0 in | Wt 152.4 lb

## 2018-01-06 DIAGNOSIS — R232 Flushing: Secondary | ICD-10-CM | POA: Diagnosis not present

## 2018-01-06 DIAGNOSIS — R51 Headache: Secondary | ICD-10-CM

## 2018-01-06 DIAGNOSIS — Z23 Encounter for immunization: Secondary | ICD-10-CM

## 2018-01-06 DIAGNOSIS — R519 Headache, unspecified: Secondary | ICD-10-CM

## 2018-01-06 DIAGNOSIS — Z780 Asymptomatic menopausal state: Secondary | ICD-10-CM | POA: Insufficient documentation

## 2018-01-06 DIAGNOSIS — Z87898 Personal history of other specified conditions: Secondary | ICD-10-CM

## 2018-01-06 DIAGNOSIS — I10 Essential (primary) hypertension: Secondary | ICD-10-CM | POA: Diagnosis not present

## 2018-01-06 LAB — COMPREHENSIVE METABOLIC PANEL
ALT: 24 U/L (ref 0–35)
AST: 19 U/L (ref 0–37)
Albumin: 4.1 g/dL (ref 3.5–5.2)
Alkaline Phosphatase: 70 U/L (ref 39–117)
BUN: 16 mg/dL (ref 6–23)
CO2: 33 mEq/L — ABNORMAL HIGH (ref 19–32)
Calcium: 9.7 mg/dL (ref 8.4–10.5)
Chloride: 102 mEq/L (ref 96–112)
Creatinine, Ser: 0.7 mg/dL (ref 0.40–1.20)
GFR: 93.19 mL/min (ref >60.00)
Glucose, Bld: 92 mg/dL (ref 70–99)
Potassium: 4.3 mEq/L (ref 3.5–5.1)
Sodium: 140 mEq/L (ref 135–145)
Total Bilirubin: 0.4 mg/dL (ref 0.2–1.2)
Total Protein: 7.1 g/dL (ref 6.0–8.3)

## 2018-01-06 LAB — CBC
HCT: 41.7 % (ref 36.0–46.0)
Hemoglobin: 13.6 g/dL (ref 12.0–15.0)
MCHC: 32.7 g/dL (ref 30.0–36.0)
MCV: 85.8 fl (ref 78.0–100.0)
Platelets: 279 10*3/uL (ref 150.0–400.0)
RBC: 4.86 Mil/uL (ref 3.87–5.11)
RDW: 13.1 % (ref 11.5–15.5)
WBC: 6.5 10*3/uL (ref 4.0–10.5)

## 2018-01-06 LAB — LUTEINIZING HORMONE: LH: 61.15 m[IU]/mL

## 2018-01-06 LAB — TSH: TSH: 0.22 u[IU]/mL — ABNORMAL LOW (ref 0.35–4.50)

## 2018-01-06 LAB — FOLLICLE STIMULATING HORMONE: FSH: 125.5 m[IU]/mL

## 2018-01-06 MED ORDER — LISINOPRIL-HYDROCHLOROTHIAZIDE 20-12.5 MG PO TABS: 1.0000 | ORAL_TABLET | Freq: Every day | ORAL | 3 refills | Status: DC

## 2018-01-06 MED ORDER — SCOPOLAMINE 1 MG/3DAYS TD PT72: 1.0000 | MEDICATED_PATCH | TRANSDERMAL | 0 refills | Status: DC

## 2018-01-06 MED ORDER — DICLOFENAC SODIUM 75 MG PO TBEC: 75.0000 mg | DELAYED_RELEASE_TABLET | Freq: Two times a day (BID) | ORAL | 0 refills | Status: DC | PRN

## 2018-01-06 NOTE — Patient Instructions (Signed)
It was very nice to see you today!  Take the diclofenac as needed.  We will give you a flu shot today and check blood work.  Depending on the results of your blood work, we [place] start a medication called celexa to help you with your hot flashes.   Take care, Dr [name]

## 2018-01-06 NOTE — Assessment & Plan Note (Signed)
No red flag signs or symptoms.  Will take metoprolol off of her medication list as she is not taking it.  Patient requested medication to use for severe headaches to use very infrequently.  Will send in diclofenac.  Instructed patient to not use more than twice weekly to prevent rebound headaches.  She is not interested in controller medication at this point.

## 2018-01-06 NOTE — Progress Notes (Signed)
   Subjective:  Sherry Carrillo is a 52 y.o. female who presents today with a chief complaint of headache.   HPI:  Headaches, chronic problem Seen last about 4 months ago for this. At that point, she was taking daily BC powder.  We started her on metoprolol succinate 50 mg daily and also send in a prescription for Imitrex.  Patient stopped taking metoprolol after a couple of weeks as she did not want to take a daily medication.  She has now completely stopped taking BC powder.  She occasionally has to take Advil for headaches.  She denies have any migraines.  No weakness or numbness.  No vision changes.  No difficulty speaking or swallowing.  Hot flashes, new problem Started several months ago.  Worsened recently.  Has them 2-3 times a day off for several months and then subside.  Patient has a history of an endometrial ablation has not had a menstrual cycle in several years.  Hypertension, chronic problem, stable Currently on lisinopril-HCTZ 20-12.5 mg tablet once daily.  Tolerating well without side effects.  History of motion sickness Patient will be going on a cruise next month and request scopolamine patch.  ROS: Per HPI  PMH: She reports that she has never smoked. She has never used smokeless tobacco. She reports that she does not drink alcohol or use drugs.  Objective:  Physical Exam: BP 122/74 (BP Location: Left Arm, Patient Position: Sitting, Cuff Size: Normal)   Pulse 90   Temp 98.4 F (36.9 C) (Oral)   Ht 4\' 11"  (1.499 m)   Wt 152 lb 6.4 oz (69.1 kg)   LMP 11/12/2011   SpO2 98%   BMI 30.78 kg/m   Gen: NAD, resting comfortably CV: RRR with no murmurs appreciated Pulm: NWOB, CTAB with no crackles, wheezes, or rhonchi MSK: No edema, cyanosis, or clubbing noted Skin: Warm, dry Neuro: Grossly normal, moves all extremities Psych: Normal affect and thought content  Assessment/Plan:  Hot flashes Possibly consistent with menopause.  Check TSH, FSH, LH, CMET, and CBC.   Patient asked about estrogen use.  Discussed potential side effects including increased risk of heart attack, stroke, VTE, and cancer.  Patient declines starting medication at this point.  Would consider starting SSRI such as Celexa 20 mg daily depending on blood work.  Headache disorder No red flag signs or symptoms.  Will take metoprolol off of her medication list as she is not taking it.  Patient requested medication to use for severe headaches to use very infrequently.  Will send in diclofenac.  Instructed patient to not use more than twice weekly to prevent rebound headaches.  She is not interested in controller medication at this point.  Essential hypertension At goal.  Continue lisinopril-HCTZ.  Check CMET.  History of motion sickness Scopolamine patch sent in.  Sherry Carrillo. Sherry Pain, MD 01/06/2018 10:24 AM

## 2018-01-06 NOTE — Assessment & Plan Note (Signed)
At goal.  Continue lisinopril-HCTZ.  Check CMET.

## 2018-01-06 NOTE — Assessment & Plan Note (Signed)
Possibly consistent with menopause.  Check TSH, FSH, LH, CMET, and CBC.  Patient asked about estrogen use.  Discussed potential side effects including increased risk of heart attack, stroke, VTE, and cancer.  Patient declines starting medication at this point.  Would consider starting SSRI such as Celexa 20 mg daily depending on blood work.

## 2018-01-08 NOTE — Progress Notes (Signed)
Please inform patient of the following:  Her labs confirm that she is in menopause, however her thyroid level is off a little as well. This could also explain some of her hot flashes.  I would like for her to come back in for a lab visit in the next week or so for confirmatory testing. Please place future orders for TSH, Free T3, and free T4.   [name]. [name], MD 01/08/2018 10:14 AM

## 2018-01-10 ENCOUNTER — Telehealth: Payer: Self-pay | Admitting: Family Medicine

## 2018-01-10 NOTE — Telephone Encounter (Signed)
See note

## 2018-01-10 NOTE — Telephone Encounter (Signed)
Copied from [place] [phone]. Topic: Quick Communication - See Telephone Encounter >> Jan 10, 2018  9:48 AM Ahmed Prima L wrote: CRM for notification. See Telephone encounter for: 01/10/18.  Patient is requesting lab results from 10/14. Please contact patient.

## 2018-01-14 ENCOUNTER — Other Ambulatory Visit: Payer: Self-pay

## 2018-01-14 DIAGNOSIS — R7989 Other specified abnormal findings of blood chemistry: Secondary | ICD-10-CM

## 2018-01-14 NOTE — Telephone Encounter (Signed)
LM for patient to return call.  CRM started.  Future lab orders placed.  Please schedule patient for lab appointment.

## 2018-01-16 ENCOUNTER — Telehealth: Payer: Self-pay

## 2018-01-16 ENCOUNTER — Other Ambulatory Visit: Payer: Self-pay

## 2018-01-16 MED ORDER — CITALOPRAM HYDROBROMIDE 20 MG PO TABS: 20.0000 mg | ORAL_TABLET | Freq: Every day | ORAL | 3 refills | Status: DC

## 2018-01-16 NOTE — Telephone Encounter (Signed)
Rx sent to pharmacy and patient notified.   

## 2018-01-16 NOTE — Telephone Encounter (Signed)
We briefly discussed options at her last visit. Please send in celexa 20mg  daily.   [name]. [name], MD 01/16/2018 4:44 PM

## 2018-01-16 NOTE — Telephone Encounter (Signed)
Patient calling stating she "desperately needs something" to help with menopause.  States she is not sleeping at night due to hot flashes and wants something called in.  States she has an appointment next week to re-check her thyroid labs, but that her thyroid has always been "borderline" and she doesn't feel that is causing these symptoms.  Would like a call back today to her work number at [phone].  Please advise.

## 2018-01-21 ENCOUNTER — Other Ambulatory Visit: Payer: BLUE CROSS/BLUE SHIELD

## 2018-07-08 ENCOUNTER — Ambulatory Visit (INDEPENDENT_AMBULATORY_CARE_PROVIDER_SITE_OTHER): Payer: BLUE CROSS/BLUE SHIELD | Admitting: Family Medicine

## 2018-07-08 ENCOUNTER — Encounter: Payer: Self-pay | Admitting: Family Medicine

## 2018-07-08 DIAGNOSIS — R232 Flushing: Secondary | ICD-10-CM

## 2018-07-08 DIAGNOSIS — R51 Headache: Secondary | ICD-10-CM

## 2018-07-08 DIAGNOSIS — I10 Essential (primary) hypertension: Secondary | ICD-10-CM | POA: Diagnosis not present

## 2018-07-08 DIAGNOSIS — R519 Headache, unspecified: Secondary | ICD-10-CM

## 2018-07-08 NOTE — Assessment & Plan Note (Signed)
Likely had component of rebound headaches.  Now that she is off BC powders symptoms have improved.  Recommended that she use analgesics no more than twice weekly going forward.

## 2018-07-08 NOTE — Assessment & Plan Note (Addendum)
Stable.  Continue lisinopril-HCTZ 20-12.5 daily.

## 2018-07-08 NOTE — Progress Notes (Signed)
   Chief Complaint:  Sherry Carrillo is a 53 y.o. female who presents today for a virtual office visit with a chief complaint of HTN follow up.   Assessment/Plan:  Hot flashes Symptoms seem to have resolved without medications. Likely secondary to menopause.  Continue with watchful waiting.  Headache disorder Likely had component of rebound headaches.  Now that she is off BC powders symptoms have improved.  Recommended that she use analgesics no more than twice weekly going forward.   Essential hypertension Stable.  Continue lisinopril-HCTZ 20-12.5 daily.    Subjective:  HPI:  Her stable, chronic medical conditions are outlined below:  # Essential Hypertension - On lisinopril-HCTZ 20-12.5mg  daily and tolerating well - Does not check home BPs - ROS: No reported chest pain or shortness of breath.   # Headache Disorder - Uses imitrex as needed  -With previously using daily BC powder.  She has stopped doing this.  Headaches have improved. - ROS: No reported weakness or numbness  # Hot Flashes -Was seen about 6 months ago for this.  Was started on Celexa.  She never started this medication.  She has not had a hot flash since then.  ROS: Per HPI  PMH: She reports that she has never smoked. She has never used smokeless tobacco. She reports that she does not drink alcohol or use drugs.      Objective/Observations  Physical Exam: Gen: NAD, resting comfortably Pulm: Normal work of breathing Neuro: Grossly normal, moves all extremities Psych: Normal affect and thought content  Virtual Visit via Video   I connected with Sherry Carrillo on 07/08/18 at  8:40 AM EDT by a video enabled telemedicine application and verified that I am speaking with the correct person using two identifiers. I discussed the limitations of evaluation and management by telemedicine and the availability of in person appointments. The patient expressed understanding and agreed to proceed.   Patient location: Private  Work Cytogeneticist location: [place] participating in the virtual visit: Myself and patient     [name]. [name], MD 07/08/2018 8:55 AM

## 2018-07-08 NOTE — Assessment & Plan Note (Signed)
Symptoms seem to have resolved without medications. Likely secondary to menopause.  Continue with watchful waiting.

## 2019-03-25 ENCOUNTER — Other Ambulatory Visit: Payer: Self-pay | Admitting: Family Medicine

## 2019-03-25 DIAGNOSIS — I10 Essential (primary) hypertension: Secondary | ICD-10-CM

## 2019-03-27 DIAGNOSIS — U071 COVID-19: Secondary | ICD-10-CM

## 2019-03-27 HISTORY — DX: COVID-19: U07.1

## 2019-04-03 ENCOUNTER — Ambulatory Visit (INDEPENDENT_AMBULATORY_CARE_PROVIDER_SITE_OTHER): Payer: BC Managed Care – PPO | Admitting: Family Medicine

## 2019-04-03 DIAGNOSIS — K219 Gastro-esophageal reflux disease without esophagitis: Secondary | ICD-10-CM | POA: Diagnosis not present

## 2019-04-03 DIAGNOSIS — R519 Headache, unspecified: Secondary | ICD-10-CM

## 2019-04-03 DIAGNOSIS — Z780 Asymptomatic menopausal state: Secondary | ICD-10-CM | POA: Diagnosis not present

## 2019-04-03 MED ORDER — PANTOPRAZOLE SODIUM 40 MG PO TBEC: 40.0000 mg | DELAYED_RELEASE_TABLET | Freq: Every day | ORAL | 0 refills | Status: DC

## 2019-04-03 MED ORDER — CITALOPRAM HYDROBROMIDE 20 MG PO TABS: 20.0000 mg | ORAL_TABLET | Freq: Every day | ORAL | 3 refills | Status: DC

## 2019-04-03 NOTE — Progress Notes (Signed)
   Sherry Carrillo is a 54 y.o. female who presents today for a virtual office visit.  Assessment/Plan:  Chronic Problems Addressed Today: GERD (gastroesophageal reflux disease) Start Protonix 40 mg daily.  Menopause Discussed treatment options.  We will start Celexa 20 mg daily.  Follow-up me in a couple weeks.  Increase dose as tolerated.  May need hormone replacement if not adequately controlled with above.  Headache disorder Stable.  No red flags.  Also related to menopausal symptoms.  She also has some component of rebound headaches as well, but has thankfully stopped taking BC powders.  If continues to be problematic, would consider referral to neurology.     Subjective:  HPI:  Patient has been having menopausal symptoms for over a year.  They were bad about a year ago but then improved for several months.  They have come back within the last couple of weeks.  She is having significant hot flashes and episodes of sweating.  She is interested in starting a medication.  She is also been having daily headaches.  This is an ongoing issue.  She takes Advil at least 2 times per week.  Frequently takes more often than this.  Overall her headaches seem to be stable.  She is also had worsening reflux over the last few weeks as well.  Over-the-counter medication such as Tums does not seem to be helping.       Objective/Observations  Physical Exam: Gen: NAD, resting comfortably Pulm: Normal work of breathing Neuro: Grossly normal, moves all extremities Psych: Normal affect and thought content  Virtual Visit via Video   I connected with Sherry Carrillo on 04/03/19 at 11:20 AM EST by a video enabled telemedicine application and verified that I am speaking with the correct person using two identifiers. The limitations of evaluation and management by telemedicine and the availability of in person appointments were discussed. The patient expressed understanding and agreed to proceed.   Patient  location: Home Provider location: [place] participating in the virtual visit: Myself and Patient     [name]. [name], MD 04/03/2019 11:56 AM

## 2019-04-03 NOTE — Assessment & Plan Note (Addendum)
Uncontrolled. Start Protonix 40 mg daily.

## 2019-04-03 NOTE — Assessment & Plan Note (Addendum)
Uncontrolled. Discussed treatment options.  We will start Celexa 20 mg daily.  Follow-up me in a couple weeks.  Increase dose as tolerated.  May need hormone replacement if not adequately controlled with above.

## 2019-04-03 NOTE — Assessment & Plan Note (Signed)
Stable.  No red flags.  Also related to menopausal symptoms.  She also has some component of rebound headaches as well, but has thankfully stopped taking BC powders.  If continues to be problematic, would consider referral to neurology.

## 2019-05-15 ENCOUNTER — Emergency Department: Payer: BC Managed Care – PPO

## 2019-05-15 ENCOUNTER — Encounter: Payer: Self-pay | Admitting: Emergency Medicine

## 2019-05-15 ENCOUNTER — Other Ambulatory Visit: Payer: Self-pay

## 2019-05-15 ENCOUNTER — Emergency Department
Admission: EM | Admit: 2019-05-15 | Discharge: 2019-05-15 | Disposition: A | Discharge status: Home / Self Care | Payer: BC Managed Care – PPO | Attending: Emergency Medicine | Admitting: Emergency Medicine

## 2019-05-15 DIAGNOSIS — I10 Essential (primary) hypertension: Secondary | ICD-10-CM | POA: Diagnosis not present

## 2019-05-15 DIAGNOSIS — R0602 Shortness of breath: Secondary | ICD-10-CM | POA: Diagnosis not present

## 2019-05-15 DIAGNOSIS — R091 Pleurisy: Secondary | ICD-10-CM | POA: Diagnosis not present

## 2019-05-15 DIAGNOSIS — R0789 Other chest pain: Secondary | ICD-10-CM | POA: Diagnosis not present

## 2019-05-15 DIAGNOSIS — Z79899 Other long term (current) drug therapy: Secondary | ICD-10-CM | POA: Diagnosis not present

## 2019-05-15 DIAGNOSIS — U071 COVID-19: Secondary | ICD-10-CM | POA: Diagnosis not present

## 2019-05-15 DIAGNOSIS — R079 Chest pain, unspecified: Secondary | ICD-10-CM

## 2019-05-15 LAB — CBC
HCT: 44 % (ref 36.0–46.0)
Hemoglobin: 14 g/dL (ref 12.0–15.0)
MCH: 27.5 pg (ref 26.0–34.0)
MCHC: 31.8 g/dL (ref 30.0–36.0)
MCV: 86.4 fL (ref 80.0–100.0)
Platelets: 263 10*3/uL (ref 150–400)
RBC: 5.09 MIL/uL (ref 3.87–5.11)
RDW: 12.7 % (ref 11.5–15.5)
WBC: 5 10*3/uL (ref 4.0–10.5)
nRBC: 0 % (ref 0.0–0.2)

## 2019-05-15 LAB — BASIC METABOLIC PANEL
Anion gap: 10 (ref 5–15)
BUN: 17 mg/dL (ref 6–20)
CO2: 27 mmol/L (ref 22–32)
Calcium: 9 mg/dL (ref 8.9–10.3)
Chloride: 104 mmol/L (ref 98–111)
Creatinine, Ser: 0.55 mg/dL (ref 0.44–1.00)
GFR calc Af Amer: >60 mL/min (ref >60)
GFR calc non Af Amer: >60 mL/min (ref >60)
Glucose, Bld: 94 mg/dL (ref 70–99)
Potassium: 4.6 mmol/L (ref 3.5–5.1)
Sodium: 141 mmol/L (ref 135–145)

## 2019-05-15 LAB — TROPONIN I (HIGH SENSITIVITY): Troponin I (High Sensitivity): 3 ng/L (ref <18)

## 2019-05-15 MED ORDER — KETOROLAC TROMETHAMINE 30 MG/ML IJ SOLN
30.0000 mg | Freq: Once | INTRAMUSCULAR | Status: AC
  Administered 2019-05-15: 30 mg via INTRAVENOUS
  Filled 2019-05-15: qty 1

## 2019-05-15 MED ORDER — IOHEXOL 350 MG/ML SOLN
75.0000 mL | Freq: Once | INTRAVENOUS | Status: AC | PRN
  Administered 2019-05-15: 12:00:00 75 mL via INTRAVENOUS

## 2019-05-15 MED ORDER — OXYCODONE HCL 5 MG PO TABS: 5.0000 mg | ORAL_TABLET | Freq: Four times a day (QID) | ORAL | 0 refills | Status: DC | PRN

## 2019-05-15 MED ORDER — SODIUM CHLORIDE 0.9% FLUSH: 3.0000 mL | Freq: Once | INTRAVENOUS | Status: DC

## 2019-05-15 MED ORDER — OXYCODONE-ACETAMINOPHEN 5-325 MG PO TABS
1.0000 | ORAL_TABLET | ORAL | Status: DC
  Filled 2019-05-15: qty 1

## 2019-05-15 NOTE — ED Triage Notes (Signed)
PT to ER with c/o mid sternal chest pain for 3 days, described as a pressure, nonradiating.  Pt also states pain is worse with inspiration.  Pt states she has not been taking BP meds.  Pt tested Covid Pos last Thursday

## 2019-05-15 NOTE — ED Notes (Signed)
E-signature not working at this time. Pt verbalized understanding of D/C instructions, prescriptions and follow up care with no further questions at this time. Pt in NAD and ambulatory at time of D/C.  

## 2019-05-15 NOTE — ED Provider Notes (Signed)
[facility] Emergency Department Provider Note   ____________________________________________   First MD Initiated Contact with Patient 05/15/19 1036     (approximate)  I have reviewed the triage vital signs and the nursing notes.   HISTORY  Chief Complaint Chest Pain    HPI Sherry Carrillo is a 54 y.o. female here for evaluation of chest pain with COVID-19  Patient reports she was diagnosed with Covid on a positive test through her doctor's office on Thursday.  She developed symptoms including upper abdominal pain, cramps, cough and shortness of breath over the weekend and through this week.  However the symptoms seem to have improved quite a bit, she is eating now, feels better, her stomach pain is better her shortness of breath and cough are improving but she has been left with a sense of pain especially when she takes a deep breath across her chest.  She reports a sharp pain with breathing  No ongoing nausea or vomiting.  No fevers.  Denies shortness of breath except when she takes a deep breath causes her to pain in the chest that is sharp     Past Medical History:  Diagnosis Date  . Abnormal pap 2009   PT HAD SURGERY BUT DON'T KNOW WHAT TYPE  . Headache(784.0)    otc meds prn  . Hypertension   . Hyperthyroidism   . SVD (spontaneous vaginal delivery)    x 2    Patient Active Problem List   Diagnosis Date Noted  . GERD (gastroesophageal reflux disease) 04/03/2019  . Menopause 01/06/2018  . Dyslipidemia 09/05/2017  . Hyperglycemia 09/05/2017  . Abdominal pain, bilateral upper quadrant 09/03/2017  . Pain of finger of left hand 04/16/2017  . Headache disorder 09/04/2016  . Other fatigue 12/19/2015  . Obesity 10/05/2015  . Left shoulder pain 03/07/2015  . Essential hypertension 03/07/2015    Past Surgical History:  Procedure Laterality Date  . FOOT SURGERY     left  . TUBAL LIGATION    . WISDOM TOOTH EXTRACTION      Prior to  Admission medications   Medication Sig Start Date End Date Taking? Authorizing Provider  lisinopril-hydrochlorothiazide (ZESTORETIC) 20-12.5 MG tablet Take 1 tablet by mouth once daily 03/25/19  Yes Vivi Barrack, MD  pantoprazole (PROTONIX) 40 MG tablet Take 1 tablet (40 mg total) by mouth daily. Patient taking differently: Take 40 mg by mouth daily as needed (acid reflux).  04/03/19  Yes Vivi Barrack, MD  citalopram (CELEXA) 20 MG tablet Take 1 tablet (20 mg total) by mouth daily. Patient not taking: Reported on 05/15/2019 04/03/19   Vivi Barrack, MD  oxyCODONE (OXY IR/ROXICODONE) 5 MG immediate release tablet Take 1 tablet (5 mg total) by mouth every 6 (six) hours as needed for severe pain. 05/15/19   Delman Kitten, MD    Allergies Sulfa antibiotics  Family History  Problem Relation Age of Onset  . Cancer Mother        LUNG  . Hypertension Mother   . Diabetes Mother   . Heart disease Father   . Diabetes Maternal Grandmother   . Heart disease Paternal Grandmother   . Heart disease Paternal Grandfather   . Thyroid disease Sister   . Diabetes Brother     Social History Social History   Tobacco Use  . Smoking status: Never Smoker  . Smokeless tobacco: Never Used  Substance Use Topics  . Alcohol use: No  . Drug use: No  Review of Systems Constitutional: No fever/chills Eyes: No visual changes. ENT: No sore throat. Cardiovascular: See HPI Respiratory: Denies shortness of breath. Gastrointestinal: No abdominal pain.   Genitourinary: Negative for dysuria. Musculoskeletal: Negative for back pain. Skin: Negative for rash. Neurological: Negative for headaches or weakness.  In general she reports she feels quite a bit better with regard to her coronavirus symptoms except she is now developed sharp chest pain    ____________________________________________   PHYSICAL EXAM:  VITAL SIGNS: ED Triage Vitals  Enc Vitals Group     BP 05/15/19 1003 (!) 157/101      Pulse Rate 05/15/19 1003 100     Resp 05/15/19 1003 15     Temp 05/15/19 1003 98.8 F (37.1 C)     Temp Source 05/15/19 1003 Oral     SpO2 05/15/19 1003 98 %     Weight 05/15/19 1005 150 lb (68 kg)     Height 05/15/19 1005 5' (1.524 m)     Head Circumference --      Peak Flow --      Pain Score 05/15/19 1005 7     Pain Loc --      Pain Edu? --      Excl. in [place]? --     Constitutional: Alert and oriented. Well appearing and in no acute distress. Eyes: Conjunctivae are normal. Head: Atraumatic. Nose: No congestion/rhinnorhea. Mouth/Throat: Mucous membranes are moist. Neck: No stridor.  Cardiovascular: Normal rate, regular rhythm. Grossly normal heart sounds.  Good peripheral circulation. Respiratory: Normal respiratory effort.  No retractions. Lungs CTAB.  Reports pain in her chest with deep inspiration.  Work of breathing appears normal.  Nursing documented respiratory rate has improved on reassessment; however on my assessment her respiratory rate is normal with normal work of breathing.  Followed up with nurse on this, rechecked and respiratory rate normalized Gastrointestinal: Soft and nontender. No distention. Musculoskeletal: No lower extremity tenderness nor edema. Neurologic:  Normal speech and language. No gross focal neurologic deficits are appreciated.  Skin:  Skin is warm, dry and intact. No rash noted. Psychiatric: Mood and affect are normal. Speech and behavior are normal.  ____________________________________________   LABS (all labs ordered are listed, but only abnormal results are displayed)  Labs Reviewed  BASIC METABOLIC PANEL  CBC  POC URINE PREG, ED  TROPONIN I (HIGH SENSITIVITY)  TROPONIN I (HIGH SENSITIVITY)   ____________________________________________  EKG  ED ECG REPORT I, Delman Kitten, the attending physician, personally viewed and interpreted this ECG.  Date: 05/15/2019 EKG Time: 1010 Rate: 90 Rhythm: normal sinus rhythm QRS Axis:  normal Intervals: normal ST/T Wave abnormalities: normal Narrative Interpretation: no evidence of acute ischemia  ____________________________________________  RADIOLOGY  CT Angio Chest PE W and/or Wo Contrast  Result Date: 05/15/2019 CLINICAL DATA:  Chest pain and shortness of breath. COVID positive. EXAM: CT ANGIOGRAPHY CHEST WITH CONTRAST TECHNIQUE: Multidetector CT imaging of the chest was performed using the standard protocol during bolus administration of intravenous contrast. Multiplanar CT image reconstructions and MIPs were obtained to evaluate the vascular anatomy. CONTRAST:  55mL OMNIPAQUE IOHEXOL 350 MG/ML SOLN COMPARISON:  Chest x-ray 05/15/2019 FINDINGS: Cardiovascular: The heart is normal in size. No pericardial effusion. There is mild tortuosity of the thoracic aorta but no aneurysm or dissection. No aortic atherosclerotic calcifications. There are age advanced coronary artery calcifications noted. The pulmonary arterial tree is fairly well opacified. No filling defects to suggest pulmonary embolism. Mediastinum/Nodes: No mediastinal or hilar mass. Scattered borderline enlarged mediastinal  and hilar lymph nodes likely due to the lung findings. The esophagus is grossly normal. Lungs/Pleura: Patchy peripheral ill-defined ground-glass type infiltrates consistent with COVID pneumonia. No worrisome pulmonary lesions or focal airspace consolidation. No pleural effusions. Upper Abdomen: No significant upper abdominal findings. Musculoskeletal: No breast masses, supraclavicular or axillary adenopathy. The thyroid gland is mildly enlarged and slightly nodular in appearance but no discrete lesions. No significant bony findings. Moderate to advanced degenerative disc disease noted at T11-12. Review of the MIP images confirms the above findings. IMPRESSION: 1. No CT findings for pulmonary embolism. 2. Normal thoracic aorta. 3. Age advanced coronary artery calcifications. 4. Patchy peripheral  ground-glass type infiltrates consistent with COVID pneumonia. Coronary artery atherosclerosis (ICD10-I70.0). Electronically Signed   By: Marijo Sanes M.D.   On: 05/15/2019 12:32   DG Chest Port 1 View  Result Date: 05/15/2019 CLINICAL DATA:  Midsternal chest pain. EXAM: PORTABLE CHEST 1 VIEW COMPARISON:  03/25/2015. FINDINGS: Mediastinum and hilar structures normal. Heart size normal. Low lung volumes. Stable mild bilateral interstitial prominence suggesting chronic interstitial lung disease. An active interstitial process including pneumonitis cannot be excluded. No pleural effusion or pneumothorax. Degenerative change thoracic spine. IMPRESSION: Low lung volumes. Stable mild bilateral interstitial prominence suggesting chronic interstitial lung disease. An active interstitial process including pneumonitis cannot be excluded. Electronically Signed   By: Marcello Moores  Register   On: 05/15/2019 10:28    Imaging studies reviewed no pulmonary embolism.  Patchy groundglass infiltrates, this appears to be consistent with COVID-19 ____________________________________________   PROCEDURES  Procedure(s) performed: None  Procedures  Critical Care performed: No  ____________________________________________   INITIAL IMPRESSION / ASSESSMENT AND PLAN / ED COURSE  Pertinent labs & imaging results that were available during my care of the patient were reviewed by me and considered in my medical decision making (see chart for details).   Patient presents with symptoms that seem consistent with COVID-19 infection that is overall improving however has now developed pleuritic chest pain in association.  She is not hypoxic, her work of breathing is normal.  She is awake alert and oriented, appears that pleuritic pain will be very atypical of ACS, has been ongoing for a few days now and her EKG and troponin are reassuring without evidence of ischemia.  Will provide anti-inflammatories, oxycodone for additional  pain relief, and work-up with CT scan to further evaluate for possible causes such as pulmonary embolism, pleuritic changes, pleural effusions, pericardial effusion etc. in the setting of known COVID-19.  ----------------------------------------- 1:43 PM on 05/15/2019 -----------------------------------------  Patient reported she would like to be discharged, she could not take oxycodone here in the ER because she is driving. I will prescribe the patient a narcotic pain medicine due to their condition which I anticipate will cause at least moderate pain short term. I discussed with the patient safe use of narcotic pain medicines, and that they are not to drive, work in dangerous areas, or ever take more than prescribed (no more than 1 pill every 6 hours). We discussed that this is the type of medication that can be  overdosed on and the risks of this type of medicine. Patient is very agreeable to only use as prescribed and to never use more than prescribed.  Sent to her pharmacy.  Discussed careful return precautions around COVID-19, she is currently stable with normal vital signs and work of breathing.  Will prescribe oxycodone briefly for pain relief, which she will also utilize with nonsteroidals and acetaminophen.  Return precautions and treatment recommendations  and follow-up discussed with the patient who is agreeable with the plan.       ____________________________________________   FINAL CLINICAL IMPRESSION(S) / ED DIAGNOSES  Final diagnoses:  COVID-19  Pleurisy      Note:  This document was prepared using Dragon voice recognition software and may include unintentional dictation errors       Delman Kitten, MD 05/15/19 1344

## 2019-06-17 ENCOUNTER — Other Ambulatory Visit: Payer: Self-pay | Admitting: Family Medicine

## 2019-06-17 DIAGNOSIS — I10 Essential (primary) hypertension: Secondary | ICD-10-CM

## 2020-02-23 ENCOUNTER — Other Ambulatory Visit: Payer: Self-pay | Admitting: Family Medicine

## 2020-02-23 DIAGNOSIS — I10 Essential (primary) hypertension: Secondary | ICD-10-CM

## 2020-04-27 DIAGNOSIS — C50919 Malignant neoplasm of unspecified site of unspecified female breast: Secondary | ICD-10-CM

## 2020-04-27 HISTORY — DX: Malignant neoplasm of unspecified site of unspecified female breast: C50.919

## 2020-05-09 LAB — BASIC METABOLIC PANEL
BUN: 16 (ref 4–21)
CO2: 25 — AB (ref 13–22)
Chloride: 98 — AB (ref 99–108)
Creatinine: 0.8 (ref 0.5–1.1)
Glucose: 96
Potassium: 4.2 (ref 3.4–5.3)
Sodium: 138 (ref 137–147)

## 2020-05-09 LAB — COMPREHENSIVE METABOLIC PANEL
Albumin: 4.5 (ref 3.5–5.0)
Calcium: 9.8 (ref 8.7–10.7)
GFR calc Af Amer: 105
GFR calc non Af Amer: 91
Globulin: 3

## 2020-05-09 LAB — CBC AND DIFFERENTIAL
HCT: 43 (ref 36–46)
Hemoglobin: 14 (ref 12.0–16.0)
Platelets: 148 — AB (ref 150–399)
WBC: 8

## 2020-05-09 LAB — LIPID PANEL
Cholesterol: 250 — AB (ref 0–200)
HDL: 57 (ref 35–70)
LDL Cholesterol: 167
Triglycerides: 142 (ref 40–160)

## 2020-05-09 LAB — HEPATIC FUNCTION PANEL
ALT: 18 (ref 7–35)
AST: 15 (ref 13–35)
Alkaline Phosphatase: 87 (ref 25–125)
Bilirubin, Direct: 0.2 (ref 0.01–0.4)

## 2020-05-09 LAB — CBC: RBC: 5.15 — AB (ref 3.87–5.11)

## 2020-05-09 LAB — VITAMIN D 25 HYDROXY (VIT D DEFICIENCY, FRACTURES): Vit D, 25-Hydroxy: 27.9

## 2020-05-12 ENCOUNTER — Other Ambulatory Visit: Payer: Self-pay | Admitting: Obstetrics and Gynecology

## 2020-05-12 DIAGNOSIS — N6320 Unspecified lump in the left breast, unspecified quadrant: Secondary | ICD-10-CM

## 2020-05-13 ENCOUNTER — Encounter: Payer: Self-pay | Admitting: Family Medicine

## 2020-05-16 ENCOUNTER — Encounter: Payer: Self-pay | Admitting: Family Medicine

## 2020-05-16 ENCOUNTER — Ambulatory Visit (INDEPENDENT_AMBULATORY_CARE_PROVIDER_SITE_OTHER): Payer: BC Managed Care – PPO | Admitting: Family Medicine

## 2020-05-16 ENCOUNTER — Other Ambulatory Visit: Payer: Self-pay

## 2020-05-16 VITALS — BP 117/78 | HR 71 | Temp 98.2°F | Ht 60.0 in | Wt 163.8 lb

## 2020-05-16 DIAGNOSIS — R739 Hyperglycemia, unspecified: Secondary | ICD-10-CM | POA: Diagnosis not present

## 2020-05-16 DIAGNOSIS — E0590 Thyrotoxicosis, unspecified without thyrotoxic crisis or storm: Secondary | ICD-10-CM | POA: Diagnosis not present

## 2020-05-16 DIAGNOSIS — I10 Essential (primary) hypertension: Secondary | ICD-10-CM | POA: Diagnosis not present

## 2020-05-16 DIAGNOSIS — E785 Hyperlipidemia, unspecified: Secondary | ICD-10-CM | POA: Diagnosis not present

## 2020-05-16 DIAGNOSIS — Z1211 Encounter for screening for malignant neoplasm of colon: Secondary | ICD-10-CM

## 2020-05-16 NOTE — Patient Instructions (Addendum)
It was very nice to see you today!  I will place a referral for you to see the endocrinologist.  We will get you set up for the cologuard.   You cholesterol levels are OK.    Take care, Dr [name]  Please try these tips to maintain a healthy lifestyle:   Eat at least 3 REAL meals and 1-2 snacks per day.  Aim for no more than 5 hours between eating.  If you eat breakfast, please do so within one hour of getting up.    Each meal should contain half fruits/vegetables, one quarter protein, and one quarter carbs (no bigger than a computer mouse)   Cut down on sweet beverages. This includes juice, soda, and sweet tea.     Drink at least 1 glass of water with each meal and aim for at least 8 glasses per day   Exercise at least 150 minutes every week.

## 2020-05-16 NOTE — Assessment & Plan Note (Addendum)
Not currently on methimazole.  Has been on the past.  Last TSH was 0.2 at gynecologist office.  Will place referral to endocrinology for further evaluation.  She would like to stay off medications if possible.  Deferred rechecking today though has previously been noted to be hyperthyroid.

## 2020-05-16 NOTE — Assessment & Plan Note (Signed)
Last glucose within normal limits.

## 2020-05-16 NOTE — Progress Notes (Signed)
   Sherry Carrillo is a 55 y.o. female who presents today for an office visit.  Assessment/Plan:  Chronic Problems Addressed Today: Essential hypertension At goal.  Continue lisinopril- HCTZ 20-12. Daily. recemtb CMET within normal limites.   Hyperthyroidism Not currently on methimazole.  Has been on the past.  Last TSH was 0.2 at gynecologist office.  Will place referral to endocrinology for further evaluation.  She would like to stay off medications if possible.  Deferred rechecking today though has previously been noted to be hyperthyroid.  Hyperglycemia Last glucose within normal limits.  Dyslipidemia Elevated planel at GYN but overal ratio and ASCVD risk remain low. Does not need statin. Continue lifestyle modifications.   Preventative [facility] Cologuard ordered. Mammogram will be done tomorrow.      Subjective:  HPI:  See A/p.  Patient is following up on lab work done at gynecologist office.  She will be getting mammogram tomorrow.  She is concerned because gynecologist felt a breast lump during her exam.       Objective:  Physical Exam: BP 117/78   Pulse 71   Temp 98.2 F (36.8 C) (Temporal)   Ht 5' (1.524 m)   Wt 163 lb 12.8 oz (74.3 kg)   LMP 11/12/2011 Comment: pt denies preg  SpO2 98%   BMI 31.99 kg/m   Gen: No acute distress, resting comfortably Neuro: Grossly normal, moves all extremities Psych: Normal affect and thought content      Sherry Carrillo M. [name], MD 05/16/2020 12:26 PM

## 2020-05-16 NOTE — Assessment & Plan Note (Signed)
Elevated planel at GYN but overal ratio and ASCVD risk remain low. Does not need statin. Continue lifestyle modifications.

## 2020-05-16 NOTE — Assessment & Plan Note (Signed)
At goal.  Continue lisinopril- HCTZ 20-12. Daily. recemtb CMET within normal limites.

## 2020-05-17 ENCOUNTER — Ambulatory Visit
Admission: RE | Admit: 2020-05-17 | Discharge: 2020-05-17 | Disposition: A | Discharge status: Home / Self Care | Payer: BC Managed Care – PPO | Source: Ambulatory Visit | Attending: Obstetrics and Gynecology | Admitting: Obstetrics and Gynecology

## 2020-05-17 ENCOUNTER — Other Ambulatory Visit: Payer: Self-pay | Admitting: Obstetrics and Gynecology

## 2020-05-17 DIAGNOSIS — N6320 Unspecified lump in the left breast, unspecified quadrant: Secondary | ICD-10-CM

## 2020-05-24 ENCOUNTER — Other Ambulatory Visit: Payer: Self-pay | Admitting: General Surgery

## 2020-05-24 DIAGNOSIS — C50412 Malignant neoplasm of upper-outer quadrant of left female breast: Secondary | ICD-10-CM

## 2020-05-24 DIAGNOSIS — Z170 Estrogen receptor positive status [ER+]: Secondary | ICD-10-CM

## 2020-05-26 ENCOUNTER — Telehealth: Payer: Self-pay | Admitting: Hematology

## 2020-05-26 NOTE — Telephone Encounter (Signed)
Received a new pt referral from Dr. Barry Dienes for new dx of breast cancer. [name] has been scheduled to see Dr. Burr Medico on 3/9 at 3pm. I cld and lft the appt date and time on her vm. I updated the breast navigators.

## 2020-05-27 NOTE — Progress Notes (Signed)
[name]   [phone] [phone]   Clinic New Consult Note   Patient Care Team: Vivi Barrack, MD as PCP - General (Family Medicine) Mauro Kaufmann, RN as Oncology Nurse Navigator Rockwell Germany, RN as Oncology Nurse Navigator  Date of Service:  06/01/2020   CHIEF COMPLAINTS/PURPOSE OF CONSULTATION:  Newly Diagnosed Malignant neoplasm of upper-outer quadrant of left breast  REFERRING PHYSICIAN:  Dr Barry Dienes   Oncology History Overview Note  Cancer Staging Malignant neoplasm of upper-outer quadrant of left breast, estrogen receptor positive ([place]) Staging form: Breast, AJCC 8th Edition - Clinical stage from 05/31/2020: Stage IIA (cT2, cN0, cM0, G3, ER+, PR+, HER2-) - Signed by Truitt Merle, MD on 06/01/2020 Stage prefix: Initial diagnosis    Malignant neoplasm of upper-outer quadrant of left breast, estrogen receptor positive ([place])  05/17/2020 Mammogram   IMPRESSION: 1. Highly suspicious 2.3 cm mass involving the UPPER OUTER QUADRANT of the LEFT breast at the 1 o'clock position approximately 8 cm from the nipple. 2. No pathologic LEFT axillary lymphadenopathy. 3. No mammographic evidence of malignancy involving the RIGHT breast.   05/17/2020 Initial Biopsy   Diagnosis Breast, left, needle core biopsy, 1 o'clock - INVASIVE MAMMARY CARCINOMA.  Microscopic Comment The carcinoma appears grade 3. The greatest linear extent of tumor in any one core is 10 mm. E-cadherin will be reported separately. Ancillary studies will be reported separately. Results reported to [facility] on 05/18/2020. Dr. Tresa Moore reviewed the case. ADDENDUM: Immunohistochemistry for E-cadherin is positive supporting a ductal phenotype.    05/17/2020 Receptors her2   PROGNOSTIC INDICATORS Results: IMMUNOHISTOCHEMICAL AND MORPHOMETRIC ANALYSIS PERFORMED MANUALLY The tumor cells are NEGATIVE for Her2 (1+). Estrogen Receptor: 90%, POSITIVE, STRONG STAINING  INTENSITY Progesterone Receptor: 60%, POSITIVE, STRONG STAINING INTENSITY Proliferation Marker Ki67: 70%   05/31/2020 Initial Diagnosis   Malignant neoplasm of upper-outer quadrant of left breast, estrogen receptor positive ([place])   05/31/2020 Cancer Staging   Staging form: Breast, AJCC 8th Edition - Clinical stage from 05/31/2020: Stage IIA (cT2, cN0, cM0, G3, ER+, PR+, HER2-) - Signed by Truitt Merle, MD on 06/01/2020 Stage prefix: Initial diagnosis      HISTORY OF PRESENTING ILLNESS:  Sherry Carrillo 55 y.o. female is a here because of newly diagnosed left breast cancer. The patient was referred by Dr Barry Dienes. The patient presents to the clinic today alone.   She notes her mass was found by screening mammogram. She did not feel her mass herself. She has not had mammogram since 2017. She has not had abnormal mammogram in the past. She denies breast or nipple changes.  She notes chronic headaches occasionally which she attributes to stress. She disease hearing or vision issues. On her routine PA smear she was seen to have abnormal results this week. She plans to have biopsy soon. She notes she is shocked with breast cancer diagnosis and thinks about her mother's previous passing from cancer.   Socially she is married. She has 2 adult daughters. She does not drink or smoked or do recreation drugs. She works as a Photographer.  She has a PMHx of HTN and Thyroid disease. She had ablation 6 years ago and not had period since. For the past 7-8 months she had hot flashes. Her [place] and LH showed post-menopausal and her Gyn NP Earnstine Regal started her on HR patch last month. She has hot flashes and did not like taking oral SSRI medication. I reviewed her  medication list with her. Her aunt had breast cancer in her 45s.     GYN HISTORY  Menarchal: 14 LMP: Not since ablation 6 years ago. Postmenopausal on 12/2017 labs.  HRT: Hormonal Patch since 04/2020.  G2P: first at age 22, minimal breast  feeding     REVIEW OF SYSTEMS:    Constitutional: Denies fevers, chills or abnormal night sweats (+) Occasional headaches (+) hot flashes  Eyes: Denies blurriness of vision, double vision or watery eyes Ears, nose, mouth, throat, and face: Denies mucositis or sore throat Respiratory: Denies cough, dyspnea or wheezes Cardiovascular: Denies palpitation, chest discomfort or lower extremity swelling Gastrointestinal:  Denies nausea, heartburn or change in bowel habits Skin: Denies abnormal skin rashes Lymphatics: Denies new lymphadenopathy or easy bruising Neurological:Denies numbness, tingling or new weaknesses Behavioral/Psych: Mood is stable, no new changes  All other systems were reviewed with the patient and are negative.   MEDICAL HISTORY:  Past Medical History:  Diagnosis Date  . Abnormal pap 2009   PT HAD SURGERY BUT DON'T KNOW WHAT TYPE  . Breast cancer ([place]) 04/27/2020  . Headache(784.0)    otc meds prn  . Hypertension   . Hyperthyroidism   . SVD (spontaneous vaginal delivery)    x 2    SURGICAL HISTORY: Past Surgical History:  Procedure Laterality Date  . FOOT SURGERY     left  . TUBAL LIGATION    . WISDOM TOOTH EXTRACTION      SOCIAL HISTORY: Social History   Socioeconomic History  . Marital status: Married    Spouse name: Not on file  . Number of children: 2  . Years of education: 39  . Highest education level: Not on file  Occupational History  . Not on file  Tobacco Use  . Smoking status: Never Smoker  . Smokeless tobacco: Never Used  Vaping Use  . Vaping Use: Never used  Substance and Sexual Activity  . Alcohol use: No  . Drug use: No  . Sexual activity: Yes    Birth control/protection: Surgical    Comment: BTL  Other Topics Concern  . Not on file  Social History Narrative   Fun: [employer]   Denies abuse and feels safe at home.    Social Determinants of Health   Financial Resource Strain: Not on file  Food Insecurity: Not on file   Transportation Needs: Not on file  Physical Activity: Not on file  Stress: Not on file  Social Connections: Not on file  Intimate Partner Violence: Not on file    FAMILY HISTORY: Family History  Problem Relation Age of Onset  . Cancer Mother        LUNG  . Hypertension Mother   . Diabetes Mother   . Heart disease Father   . Diabetes Maternal Grandmother   . Heart disease Paternal Grandmother   . Heart disease Paternal Grandfather   . Thyroid disease Sister   . Diabetes Brother   . Cancer Maternal Aunt 65       breast cancer     ALLERGIES:  is allergic to sulfa antibiotics.  MEDICATIONS:  Current Outpatient Medications  Medication Sig Dispense Refill  . venlafaxine (EFFEXOR) 37.5 MG tablet Take 1 tablet (37.5 mg total) by mouth 2 (two) times daily. 30 tablet 0  . Cholecalciferol (VITAMIN D3) 1.25 MG (50000 UT) CAPS Take 50,000 Units by mouth every 7 (seven) days.    Marland Kitchen lisinopril-hydrochlorothiazide (ZESTORETIC) 20-12.5 MG tablet Take 1 tablet by mouth once daily (  Patient taking differently: Take 1 tablet by mouth daily.) 90 tablet 0   No current facility-administered medications for this visit.    PHYSICAL EXAMINATION: ECOG PERFORMANCE STATUS: 0 - Asymptomatic  Vitals:   06/01/20 1519  BP: 117/71  Pulse: 80  Resp: 18  Temp: 98.7 F (37.1 C)  SpO2: 98%   Filed Weights   06/01/20 1519  Weight: 166 lb 4.8 oz (75.4 kg)    GENERAL:alert, no distress and comfortable SKIN: skin color, texture, turgor are normal, no rashes or significant lesions EYES: normal, Conjunctiva are pink and non-injected, sclera clear  NECK: supple, thyroid normal size, non-tender, without nodularity LYMPH:  no palpable lymphadenopathy in the cervical, axillary  LUNGS: clear to auscultation and percussion with normal breathing effort HEART: regular rate & rhythm and no murmurs and no lower extremity edema ABDOMEN:abdomen soft, non-tender and normal bowel sounds Musculoskeletal:no  cyanosis of digits and no clubbing  NEURO: alert & oriented x 3 with fluent speech, no focal motor/sensory deficits BREAST: (+) Palpable 1.5x2cm lump in UOQ left breast with skin ecchymosis. Right Breast exam benign.  LABORATORY DATA:  I have reviewed the data as listed CBC Latest Ref Rng & Units 06/01/2020 05/09/2020 05/15/2019  WBC 4.0 - 10.5 K/uL 8.4 8.0 5.0  Hemoglobin 12.0 - 15.0 g/dL 13.3 14.0 14.0  Hematocrit 36.0 - 46.0 % 42.0 43 44.0  Platelets 150 - 400 K/uL 258 148(A) 263    CMP Latest Ref Rng & Units 06/01/2020 05/09/2020 05/15/2019  Glucose 70 - 99 mg/dL 96 - 94  BUN 6 - 20 mg/dL _0 Creatinine 0.44 - 1.00 mg/dL 0.71 0.8 0.55  Sodium 135 - 145 mmol/L 138 138 141  Potassium 3.5 - 5.1 mmol/L 4.1 4.2 4.6  Chloride 98 - 111 mmol/L 102 98(A) 104  CO2 22 - 32 mmol/L 26 25(A) 27  Calcium 8.9 - 10.3 mg/dL 9.6 9.8 9.0  Total Protein 6.5 - 8.1 g/dL 7.2 - -  Total Bilirubin 0.3 - 1.2 mg/dL 0.4 - -  Alkaline Phos 38 - 126 U/L 67 87 -  AST 15 - 41 U/L 15 15 -  ALT 0 - 44 U/L 14 18 -     RADIOGRAPHIC STUDIES: I have personally reviewed the radiological images as listed and agreed with the findings in the report. US BREAST LTD UNI LEFT INC AXILLA  Result Date: 05/17/2020 CLINICAL DATA:  55 year old presenting with a palpable lump in the UPPER OUTER QUADRANT of the LEFT breast on recent clinical examination. Annual evaluation, RIGHT breast. EXAM: DIGITAL DIAGNOSTIC BILATERAL MAMMOGRAM WITH TOMOSYNTHESIS AND CAD; ULTRASOUND LEFT BREAST LIMITED TECHNIQUE: Bilateral digital diagnostic mammography and breast tomosynthesis was performed. The images were evaluated with computer-aided detection.; Targeted ultrasound examination of the left breast was performed COMPARISON:  Previous exam(s). ACR Breast Density Category b: There are scattered areas of fibroglandular density. FINDINGS: RIGHT: No findings suspicious for malignancy. LEFT: Irregular hyperdense mass associated with architectural  distortion involving the UPPER OUTER QUADRANT at POSTERIOR depth, measuring approximately 2-2.5 cm. There is no associated calcifications. No suspicious findings elsewhere. Targeted ultrasound is performed, showing an irregular hypoechoic mass with a hyperechoic halo at the 1 o'clock position approximately 8 cm from the nipple, measuring approximately 2.3 x 1.6 x 2.0 cm, demonstrating slight posterior acoustic shadowing and demonstrating internal power Doppler flow. Sonographic evaluation of the LEFT axilla demonstrates no pathologic lymphadenopathy. On correlative physical exam, there is an approximate 2-3 cm firm palpable lump in the [place] of  the LEFT breast. IMPRESSION: 1. Highly suspicious 2.3 cm mass involving the UPPER OUTER QUADRANT of the LEFT breast at the 1 o'clock position approximately 8 cm from the nipple. 2. No pathologic LEFT axillary lymphadenopathy. 3. No mammographic evidence of malignancy involving the RIGHT breast. RECOMMENDATION: Ultrasound-guided core needle biopsy of the LEFT breast mass. The ultrasound biopsy procedure was discussed with the patient and her questions were answered. She wishes to proceed and the biopsy has been scheduled for later today at 1:30 p.m. I have discussed the findings and recommendations with the patient. BI-RADS CATEGORY  5: Highly suggestive of malignancy. Electronically Signed   By: Evangeline Dakin M.D.   On: 05/17/2020 08:48   MM DIAG BREAST TOMO BILATERAL  Result Date: 05/17/2020 CLINICAL DATA:  55 year old presenting with a palpable lump in the UPPER OUTER QUADRANT of the LEFT breast on recent clinical examination. Annual evaluation, RIGHT breast. EXAM: DIGITAL DIAGNOSTIC BILATERAL MAMMOGRAM WITH TOMOSYNTHESIS AND CAD; ULTRASOUND LEFT BREAST LIMITED TECHNIQUE: Bilateral digital diagnostic mammography and breast tomosynthesis was performed. The images were evaluated with computer-aided detection.; Targeted ultrasound examination of the left  breast was performed COMPARISON:  Previous exam(s). ACR Breast Density Category b: There are scattered areas of fibroglandular density. FINDINGS: RIGHT: No findings suspicious for malignancy. LEFT: Irregular hyperdense mass associated with architectural distortion involving the UPPER OUTER QUADRANT at POSTERIOR depth, measuring approximately 2-2.5 cm. There is no associated calcifications. No suspicious findings elsewhere. Targeted ultrasound is performed, showing an irregular hypoechoic mass with a hyperechoic halo at the 1 o'clock position approximately 8 cm from the nipple, measuring approximately 2.3 x 1.6 x 2.0 cm, demonstrating slight posterior acoustic shadowing and demonstrating internal power Doppler flow. Sonographic evaluation of the LEFT axilla demonstrates no pathologic lymphadenopathy. On correlative physical exam, there is an approximate 2-3 cm firm palpable lump in the UPPER OUTER QUADRANT of the LEFT breast. IMPRESSION: 1. Highly suspicious 2.3 cm mass involving the UPPER OUTER QUADRANT of the LEFT breast at the 1 o'clock position approximately 8 cm from the nipple. 2. No pathologic LEFT axillary lymphadenopathy. 3. No mammographic evidence of malignancy involving the RIGHT breast. RECOMMENDATION: Ultrasound-guided core needle biopsy of the LEFT breast mass. The ultrasound biopsy procedure was discussed with the patient and her questions were answered. She wishes to proceed and the biopsy has been scheduled for later today at 1:30 p.m. I have discussed the findings and recommendations with the patient. BI-RADS CATEGORY  5: Highly suggestive of malignancy. Electronically Signed   By: Evangeline Dakin M.D.   On: 05/17/2020 08:48   MM CLIP PLACEMENT LEFT  Result Date: 05/17/2020 CLINICAL DATA:  Patient status post ultrasound-guided core needle biopsy left breast mass. EXAM: DIAGNOSTIC LEFT MAMMOGRAM POST ULTRASOUND BIOPSY COMPARISON:  Previous exam(s). FINDINGS: Mammographic images were obtained  following ultrasound guided biopsy of left breast mass. The biopsy marking clip is in expected position at the site of biopsy. IMPRESSION: Appropriate positioning of the ribbon shaped biopsy marking clip at the site of biopsy in the left breast 1 o'clock position. Final Assessment: Post Procedure Mammograms for Marker Placement Electronically Signed   By: Lovey Newcomer M.D.   On: 05/17/2020 15:10   [place] LT BREAST BX W LOC DEV 1ST LESION IMG BX SPEC US GUIDE  Addendum Date: 05/19/2020   ADDENDUM REPORT: 05/18/2020 13:45 ADDENDUM: Pathology revealed GRADE III INVASIVE MAMMARY CARCINOMA of the LEFT breast, 1 o'clock. This was found to be concordant by Dr. Lovey Newcomer. Pathology results were discussed with the patient by  telephone. The patient reported doing well after the biopsy with tenderness at the site. Post biopsy instructions and care were reviewed and questions were answered. The patient was encouraged to call [facility] for any additional concerns. Surgical consultation has been arranged with Dr. [name] at [facility] on May 24, 2020. Pathology results reported by Stacie Acres RN on 05/18/2020. Electronically Signed   By: Lovey Newcomer M.D.   On: 05/18/2020 13:45   Result Date: 05/19/2020 CLINICAL DATA:  Patient with indeterminate left breast mass 1 o'clock position. EXAM: ULTRASOUND GUIDED LEFT BREAST CORE NEEDLE BIOPSY COMPARISON:  Previous exam(s). PROCEDURE: I met with the patient and we discussed the procedure of ultrasound-guided biopsy, including benefits and alternatives. We discussed the high likelihood of a successful procedure. We discussed the risks of the procedure, including infection, bleeding, tissue injury, clip migration, and inadequate sampling. Informed written consent was given. The usual time-out protocol was performed immediately prior to the procedure. Lesion quadrant: Upper outer quadrant Using sterile technique and 1% Lidocaine as local  anesthetic, under direct ultrasound visualization, a 14 gauge spring-loaded device was used to perform biopsy of left breast mass 1 o'clock position using a lateral approach. At the conclusion of the procedure ribbon shaped tissue marker clip was deployed into the biopsy cavity. Follow up 2 view mammogram was performed and dictated separately. IMPRESSION: Ultrasound guided biopsy of left breast mass 1 o'clock position. No apparent complications. Electronically Signed: By: Lovey Newcomer M.D. On: 05/17/2020 15:11    ASSESSMENT & PLAN:  Sherry Carrillo is a 55 y.o. female with a history of HTN, Hyperthyroidism   1. Malignant neoplasm of upper-outer quadrant of left breast, Stage IIA, c(T2N0M0), ER+/PR+/HER2-, Grade III  -We discussed her image findings and the biopsy results in great details. She has 8cm mass of left UOQ with invasive ductal carcinoma on biopsy.  -Given the early stage disease, she is likely a candidate for lumpectomy with sentinel LN Biopsy. She is agreeable with that. She was seen by Dr. Barry Dienes and will proceed with surgery on 06/03/20.  -I recommend a Oncotype Dx test on the surgical sample and we'll make a decision about adjuvant chemotherapy based on the Oncotype result. Written material of this test was given to her. She is young and fit, would be a good candidate for chemotherapy if her Oncotype recurrence score is high. -If her surgical sentinel lymph node positive, I recommend mammaprint for further risk stratification and guide adjuvant chemotherapy. -The risk of recurrence depends on the stage and biology of the tumor. She is early stage, with ER/PR positive and HER2 negative markers. I discussed this is the more common type of breast cancer, but given high grade and high KI67 (70%), this is possible high risk disease.  -She was also seen by radiation oncologist Dr. Lisbeth Renshaw today. Adjuvant radiation is recommended after lumpectomy to reduce her risk of local recurrence.   -Given the  strong ER and PR expression in her postmenopausal status, I recommend adjuvant endocrine therapy with aromatase inhibitor for a total of 5-10 years to reduce the risk of cancer recurrence. Potential benefits and side effects were discussed with patient and she is interested. -We also discussed the breast cancer surveillance after her surgery. She will continue annual screening mammogram, self exam, and a routine office visit with lab and exam with [place]. -Labs reviewed, CBC and CMP WNL. Initial exam shows Palpable 1.5x2cm lump in UOQ left breast.  -F/u after surgery  or Radiation based on surgical pathology.    2. Hot flashes, Hormonal Replacement  -She was seen to be post-menopausal on 12/2017 labs with [place] -She started having hot flashes 7-8 months ago. She tried SSRI but did not take long enough to see effects.  -Her gyn started on her Estradial patch in 04/2020 which helps very well.  -I discussed given her ER/PR positive disease, her cancer feeds on these hormones in her replacement.  -I advised her to stop and not use any more estrogen/progesterone containing supplements. I recommend SSRI with Effexor to help her hot flashes, I reviewed side effects with her. She is willing to try (06/01/20).  -I recommend obtaining baseline DEXA in 2022 given she is post-menopausal.    3. Abnormal Pap Smear 04/2020 -Pt notes she was recently told of abnormal Pap smear. She plans to have biopsy soon.    4. Social Support -She is married with 2 adult daughters. Her family is aware of her breast cancer diagnosis.  -She is overwhelmed with diagnosis and possibility of chemo. I offered her counseling through our chaplin or SW. She declined today  -I discussed she can bring a family member with her. I offered to call them about her visit today, she declined.    5. Genetic Testing  -She has mild family history of breast cancer. I discussed if she is interested in genetic testing I can refer her.  She will think about it.    PLAN:  -I called in Effexor today  -Proceed with surgery on 06/03/20.  -F/u after surgery or Radiation    No orders of the defined types were placed in this encounter.   All questions were answered. The patient knows to call the clinic with any problems, questions or concerns. The total time spent in the appointment was 60 minutes.     Truitt Merle, MD 06/01/2020 4:43 PM  I, Joslyn Devon, am acting as scribe for Truitt Merle, MD.   I have reviewed the above documentation for accuracy and completeness, and I agree with the above.

## 2020-05-31 ENCOUNTER — Ambulatory Visit
Admission: RE | Admit: 2020-05-31 | Discharge: 2020-05-31 | Disposition: A | Discharge status: Home / Self Care | Payer: BC Managed Care – PPO | Source: Ambulatory Visit | Attending: Radiation Oncology | Admitting: Radiation Oncology

## 2020-05-31 ENCOUNTER — Other Ambulatory Visit (HOSPITAL_COMMUNITY): Payer: BC Managed Care – PPO

## 2020-05-31 ENCOUNTER — Other Ambulatory Visit: Payer: Self-pay | Admitting: General Surgery

## 2020-05-31 ENCOUNTER — Encounter: Payer: Self-pay | Admitting: Radiation Oncology

## 2020-05-31 VITALS — Ht 60.0 in | Wt 160.0 lb

## 2020-05-31 DIAGNOSIS — C50412 Malignant neoplasm of upper-outer quadrant of left female breast: Secondary | ICD-10-CM

## 2020-05-31 DIAGNOSIS — Z170 Estrogen receptor positive status [ER+]: Secondary | ICD-10-CM

## 2020-05-31 NOTE — Progress Notes (Signed)
New Breast Cancer Diagnosis: Left Breast Cancer  Did patient present with symptoms (if so, please note symptoms) or screening mammography?:Palpable mass    Location and Extent of disease :left breast. Located at 1 o'clock position, measured  2.3 cm in greatest dimension. Adenopathy no.  Histology per Pathology Report:  05/17/2020  Receptor Status: ER(positive), PR (positive), Her2-neu (negative), Ki-(70%)  Surgeon and surgical plan, if any: Dr. Barry Dienes -Left Lumpectomy 06/03/2020  Medical oncologist, treatment if any:    Family History of Breast/Ovarian/Prostate Cancer: Maternal Aunt with Breast cancer  Lymphedema issues, if any:  No  Pain issues, if any:  No  SAFETY ISSUES: Prior radiation? No Pacemaker/ICD? No Possible current pregnancy? Postmenopausal, ablation Is the patient on methotrexate? No  Current Complaints / other details:

## 2020-05-31 NOTE — Progress Notes (Signed)
Radiation Oncology         [phone] ________________________________  Initial Outpatient Consultation - Conducted via telephone due to current COVID-19 concerns for limiting patient exposure  I spoke with the patient to conduct this consult visit via telephone to spare the patient unnecessary potential exposure in the healthcare setting during the current COVID-19 pandemic. The patient was notified in advance and was offered a [place] meeting to allow for face to face communication but unfortunately reported that they did not have the appropriate resources/technology to support such a visit and instead preferred to proceed with a telephone consult.    Name: Sherry Carrillo        MRN: [id number]  Date of Service: 05/31/2020 DOB: 1966-01-01  PI:RJJOAC, [name], MD  [name], MD     REFERRING PHYSICIAN: [name], MD   DIAGNOSIS: The encounter diagnosis was Malignant neoplasm of upper-outer quadrant of left breast in female, estrogen receptor positive ([place]).   HISTORY OF PRESENT ILLNESS: Sherry Carrillo is a 55 y.o. female seen at the request of Dr. Barry Dienes for a new diagnosis of left breast cancer.  The patient had a palpable lump in the left breast on recent clinical examination and underwent diagnostic bilateral mammogram that revealed a 2 to 2.5 cm mass in the upper outer quadrant.  Targeted ultrasound identified a 2.3 cm mass in the 1 o'clock position without adenopathy. 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  She has been counseled on the role of lumpectomy with sentinel lymph node biopsy.  She is due to have lumpectomy surgery on 06/03/20. She's contacted today by time to discuss treatment options.    PREVIOUS RADIATION THERAPY: No   PAST MEDICAL HISTORY:  Past Medical History:  Diagnosis Date  . Abnormal pap 2009   PT HAD SURGERY BUT DON'T KNOW WHAT TYPE  . Breast cancer ([place]) 04/27/2020  .  Headache(784.0)    otc meds prn  . Hypertension   . Hyperthyroidism   . SVD (spontaneous vaginal delivery)    x 2       PAST SURGICAL HISTORY: Past Surgical History:  Procedure Laterality Date  . FOOT SURGERY     left  . TUBAL LIGATION    . WISDOM TOOTH EXTRACTION       FAMILY HISTORY:  Family History  Problem Relation Age of Onset  . Cancer Mother        LUNG  . Hypertension Mother   . Diabetes Mother   . Heart disease Father   . Diabetes Maternal Grandmother   . Heart disease Paternal Grandmother   . Heart disease Paternal Grandfather   . Thyroid disease Sister   . Diabetes Brother      SOCIAL HISTORY:  reports that she has never smoked. She has never used smokeless tobacco. She reports that she does not drink alcohol and does not use drugs. The patient is married and lives in [place]. 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   ALLERGIES: Sulfa antibiotics   MEDICATIONS:  Current Outpatient Medications  Medication Sig Dispense Refill  . Cholecalciferol (VITAMIN D3) 1.25 MG (50000 UT) CAPS TAKE 1 CAPSULE BY MOUTH ONCE A WEEK WITH FOOD FOR 8 WEEKS    . estradiol-norethindrone (COMBIPATCH) 0.05-0.14 MG/DAY Place 1 patch onto the skin 2 (two) times a week.    Marland Kitchen lisinopril-hydrochlorothiazide (ZESTORETIC) 20-12.5 MG tablet Take 1 tablet by mouth  once daily 90 tablet 0   No current facility-administered medications for this encounter.     REVIEW OF SYSTEMS: On review of systems, the patient reports that she is doing fairly well but has been overwhelmed by her diagnosis. She denies any concerns with her breast at this time. No other complaints are verbalized.     PHYSICAL EXAM:  Wt Readings from Last 3 Encounters:  05/31/20 160 lb (72.6 kg)  05/16/20 163 lb 12.8 oz (74.3 kg)  05/15/19 150 lb (68 kg)   Unable to assess due to encounter type.  ECOG = 0  0 - Asymptomatic (Fully active, able to carry on all predisease activities without  restriction)  1 - Symptomatic but completely ambulatory (Restricted in physically strenuous activity but ambulatory and able to carry out work of a light or sedentary nature. For example, light housework, office work)  2 - Symptomatic, <50% in bed during the day (Ambulatory and capable of all self care but unable to carry out any work activities. Up and about more than 50% of waking hours)  3 - Symptomatic, >50% in bed, but not bedbound (Capable of only limited self-care, confined to bed or chair 50% or more of waking hours)  4 - Bedbound (Completely disabled. Cannot carry on any self-care. Totally confined to bed or chair)  5 - Death   Eustace Pen MM, Creech RH, Tormey DC, et al. [phone]). "Toxicity and response criteria of the [facility]". [place] Oncol. 5 (6): 649-55    LABORATORY DATA:  Lab Results  Component Value Date   WBC 8.0 05/09/2020   HGB 14.0 05/09/2020   HCT 43 05/09/2020   MCV 86.4 05/15/2019   PLT 148 (A) 05/09/2020   Lab Results  Component Value Date   NA 138 05/09/2020   K 4.2 05/09/2020   CL 98 (A) 05/09/2020   CO2 25 (A) 05/09/2020   Lab Results  Component Value Date   ALT 18 05/09/2020   AST 15 05/09/2020   ALKPHOS 87 05/09/2020   BILITOT 0.4 01/06/2018      RADIOGRAPHY: US BREAST LTD UNI LEFT INC AXILLA  Result Date: 05/17/2020 CLINICAL DATA:  55 year old presenting with a palpable lump in the UPPER OUTER QUADRANT of the LEFT breast on recent clinical examination. Annual evaluation, RIGHT breast. EXAM: DIGITAL DIAGNOSTIC BILATERAL MAMMOGRAM WITH TOMOSYNTHESIS AND CAD; ULTRASOUND LEFT BREAST LIMITED TECHNIQUE: Bilateral digital diagnostic mammography and breast tomosynthesis was performed. The images were evaluated with computer-aided detection.; Targeted ultrasound examination of the left breast was performed COMPARISON:  Previous exam(s). ACR Breast Density Category b: There are scattered areas of fibroglandular density. FINDINGS:  RIGHT: No findings suspicious for malignancy. LEFT: Irregular hyperdense mass associated with architectural distortion involving the UPPER OUTER QUADRANT at POSTERIOR depth, measuring approximately 2-2.5 cm. There is no associated calcifications. No suspicious findings elsewhere. Targeted ultrasound is performed, showing an irregular hypoechoic mass with a hyperechoic halo at the 1 o'clock position approximately 8 cm from the nipple, measuring approximately 2.3 x 1.6 x 2.0 cm, demonstrating slight posterior acoustic shadowing and demonstrating internal power Doppler flow. Sonographic evaluation of the LEFT axilla demonstrates no pathologic lymphadenopathy. On correlative physical exam, there is an approximate 2-3 cm firm palpable lump in the UPPER OUTER QUADRANT of the LEFT breast. IMPRESSION: 1. Highly suspicious 2.3 cm mass involving the UPPER OUTER QUADRANT of the LEFT breast at the 1 o'clock position approximately 8 cm from the nipple. 2. No pathologic LEFT axillary lymphadenopathy. 3. No  mammographic evidence of malignancy involving the RIGHT breast. RECOMMENDATION: Ultrasound-guided core needle biopsy of the LEFT breast mass. The ultrasound biopsy procedure was discussed with the patient and her questions were answered. She wishes to proceed and the biopsy has been scheduled for later today at 1:30 p.m. I have discussed the findings and recommendations with the patient. BI-RADS CATEGORY  5: Highly suggestive of malignancy. Electronically Signed   By: Evangeline Dakin M.D.   On: 05/17/2020 08:48   MM DIAG BREAST TOMO BILATERAL  Result Date: 05/17/2020 CLINICAL DATA:  55 year old presenting with a palpable lump in the UPPER OUTER QUADRANT of the LEFT breast on recent clinical examination. Annual evaluation, RIGHT breast. EXAM: DIGITAL DIAGNOSTIC BILATERAL MAMMOGRAM WITH TOMOSYNTHESIS AND CAD; ULTRASOUND LEFT BREAST LIMITED TECHNIQUE: Bilateral digital diagnostic mammography and breast tomosynthesis was  performed. The images were evaluated with computer-aided detection.; Targeted ultrasound examination of the left breast was performed COMPARISON:  Previous exam(s). ACR Breast Density Category b: There are scattered areas of fibroglandular density. FINDINGS: RIGHT: No findings suspicious for malignancy. LEFT: Irregular hyperdense mass associated with architectural distortion involving the UPPER OUTER QUADRANT at POSTERIOR depth, measuring approximately 2-2.5 cm. There is no associated calcifications. No suspicious findings elsewhere. Targeted ultrasound is performed, showing an irregular hypoechoic mass with a hyperechoic halo at the 1 o'clock position approximately 8 cm from the nipple, measuring approximately 2.3 x 1.6 x 2.0 cm, demonstrating slight posterior acoustic shadowing and demonstrating internal power Doppler flow. Sonographic evaluation of the LEFT axilla demonstrates no pathologic lymphadenopathy. On correlative physical exam, there is an approximate 2-3 cm firm palpable lump in the UPPER OUTER QUADRANT of the LEFT breast. IMPRESSION: 1. Highly suspicious 2.3 cm mass involving the UPPER OUTER QUADRANT of the LEFT breast at the 1 o'clock position approximately 8 cm from the nipple. 2. No pathologic LEFT axillary lymphadenopathy. 3. No mammographic evidence of malignancy involving the RIGHT breast. RECOMMENDATION: Ultrasound-guided core needle biopsy of the LEFT breast mass. The ultrasound biopsy procedure was discussed with the patient and her questions were answered. She wishes to proceed and the biopsy has been scheduled for later today at 1:30 p.m. I have discussed the findings and recommendations with the patient. BI-RADS CATEGORY  5: Highly suggestive of malignancy. Electronically Signed   By: Evangeline Dakin M.D.   On: 05/17/2020 08:48   MM CLIP PLACEMENT LEFT  Result Date: 05/17/2020 CLINICAL DATA:  Patient status post ultrasound-guided core needle biopsy left breast mass. EXAM: DIAGNOSTIC  LEFT MAMMOGRAM POST ULTRASOUND BIOPSY COMPARISON:  Previous exam(s). FINDINGS: Mammographic images were obtained following ultrasound guided biopsy of left breast mass. The biopsy marking clip is in expected position at the site of biopsy. IMPRESSION: Appropriate positioning of the ribbon shaped biopsy marking clip at the site of biopsy in the left breast 1 o'clock position. Final Assessment: Post Procedure Mammograms for Marker Placement Electronically Signed   By: Lovey Newcomer M.D.   On: 05/17/2020 15:10   [place] LT BREAST BX W LOC DEV 1ST LESION IMG BX SPEC US GUIDE  Addendum Date: 05/19/2020   ADDENDUM REPORT: 05/18/2020 13:45 ADDENDUM: Pathology revealed GRADE III INVASIVE MAMMARY CARCINOMA of the LEFT breast, 1 o'clock. This was found to be concordant by Dr. Lovey Newcomer. Pathology results were discussed with the patient by telephone. The patient reported doing well after the biopsy with tenderness at the site. Post biopsy instructions and care were reviewed and questions were answered. The patient was encouraged to call [facility] for  any additional concerns. Surgical consultation has been arranged with Dr. [name] at [facility] Surgery on May 24, 2020. Pathology results reported by Stacie Acres RN on 05/18/2020. Electronically Signed   By: Lovey Newcomer M.D.   On: 05/18/2020 13:45   Result Date: 05/19/2020 CLINICAL DATA:  Patient with indeterminate left breast mass 1 o'clock position. EXAM: ULTRASOUND GUIDED LEFT BREAST CORE NEEDLE BIOPSY COMPARISON:  Previous exam(s). PROCEDURE: I met with the patient and we discussed the procedure of ultrasound-guided biopsy, including benefits and alternatives. We discussed the high likelihood of a successful procedure. We discussed the risks of the procedure, including infection, bleeding, tissue injury, clip migration, and inadequate sampling. Informed written consent was given. The usual time-out protocol was performed immediately  prior to the procedure. Lesion quadrant: Upper outer quadrant Using sterile technique and 1% Lidocaine as local anesthetic, under direct ultrasound visualization, a 14 gauge spring-loaded device was used to perform biopsy of left breast mass 1 o'clock position using a lateral approach. At the conclusion of the procedure ribbon shaped tissue marker clip was deployed into the biopsy cavity. Follow up 2 view mammogram was performed and dictated separately. IMPRESSION: Ultrasound guided biopsy of left breast mass 1 o'clock position. No apparent complications. Electronically Signed: By: Lovey Newcomer M.D. On: 05/17/2020 15:11       IMPRESSION/PLAN: 1. Stage IIA, cT2N0M0 grade 3, ER/PR positive invasive ductal carcinoma of the left breast. Dr. Lisbeth Renshaw discusses the pathology findings and reviews the nature of left breast disease. The consensus from the breast conference includes breast conservation with lumpectomy with  sentinel node biopsy. Dr. Burr Medico anticipates Oncotype Dx score to determine a role for systemic therapy. Provided that chemotherapy is not indicated, the patient's course would then be followed by external radiotherapy to the breast  to reduce risks of local recurrence followed by antiestrogen therapy. We discussed the risks, benefits, short, and long term effects of radiotherapy, as well as the curative intent, and the patient is interested in proceeding. Dr. Lisbeth Renshaw discusses the delivery and logistics of radiotherapy and anticipates a course of  4 to 6 1/2 weeks of radiotherapy, aiming for 4 weeks with deep inspiration breath hold. We will see her back a few weeks after surgery to discuss the simulation process and anticipate we starting radiotherapy about 4-6 weeks after surgery.    Given current concerns for patient exposure during the COVID-19 pandemic, this encounter was conducted via telephone.  The patient has provided two factor identification and has given verbal consent for this type of  encounter and has been advised to only accept a meeting of this type in a secure network environment. The time spent during this encounter was 60 minutes including preparation, discussion, and coordination of the patient's care. The attendants for this meeting include Blenda Nicely, RN, Dr. Lisbeth Renshaw, Hayden Pedro  and Antony Odea.  During the encounter,  Blenda Nicely, RN, Dr. Lisbeth Renshaw, and Hayden Pedro were located at [facility] Radiation Oncology Department.  Sherry Carrillo was located at home.    The above documentation reflects my direct findings during this shared patient visit. Please see the separate note by Dr. Lisbeth Renshaw on this date for the remainder of the patient's plan of care.    Carola Rhine, [facility]    **Disclaimer: This note was dictated with voice recognition software. Similar sounding words can inadvertently be transcribed and this note may contain transcription errors which may not have been corrected upon publication of  note.**

## 2020-05-31 NOTE — Progress Notes (Addendum)
Surgical Instructions   Your procedure is scheduled on Friday, March 11th.  Report to [facility] Main Entrance "A" at 5:30 A.M., then check in with the Admitting office.  Call this number if you have problems the morning of surgery:  [phone]   If you have any questions prior to your surgery date call [phone]: Open Monday-Friday 8am-4pm   Remember:  Do not eat after midnight the night before your surgery  You may drink clear liquids until 4:30 A.M. the morning of your surgery.   Clear liquids allowed are: Water, Non-Citrus Juices (without pulp), Carbonated Beverages, Clear Tea, Black Coffee Only, and Gatorade  Please complete your PRE-SURGERY ENSURE that was provided to you by 4:30 A.M. the morning of surgery. Please, if able, drink it in one setting. DO NOT SIP.                Take NO medicines the morning of surgery  As of today, STOP taking any Aspirin (unless otherwise instructed by your surgeon) Aleve, Naproxen, Ibuprofen, Motrin, Advil, Goody's, BC's, all herbal medications, fish oil, and all vitamins.                     Do not wear jewelry, make up, or nail polish            Do not wear lotions, powders, perfumes, or deodorant.            Do not shave 48 hours prior to surgery.              Do not bring valuables to the hospital.            [facility] is not responsible for any belongings or valuables.  Do NOT Smoke (Tobacco/Vaping) or drink Alcohol 24 hours prior to your procedure If you use a CPAP at night, you may bring all equipment for your overnight stay.   Contacts, glasses, dentures or bridgework may not be worn into surgery, please bring cases for these belongings   For patients admitted to the hospital, discharge time will be determined by your treatment team.   Patients discharged the day of surgery will not be allowed to drive home, and someone needs to stay with them for 24 hours.  Special instructions:   [facility]- Preparing For Surgery  Before  surgery, you can play an important role. Because skin is not sterile, your skin needs to be as free of germs as possible. You can reduce the number of germs on your skin by washing with CHG (chlorahexidine gluconate) Soap before surgery.  CHG is an antiseptic cleaner which kills germs and bonds with the skin to continue killing germs even after washing.    Oral Hygiene is also important to reduce your risk of infection.  Remember - BRUSH YOUR TEETH THE MORNING OF SURGERY WITH YOUR REGULAR TOOTHPASTE  Please do not use if you have an allergy to CHG or antibacterial soaps. If your skin becomes reddened/irritated stop using the CHG.  Do not shave (including legs and underarms) for at least 48 hours prior to first CHG shower. It is OK to shave your face.  Please follow these instructions carefully.   1. Shower the NIGHT BEFORE SURGERY and the MORNING OF SURGERY  2. If you chose to wash your hair, wash your hair first as usual with your normal shampoo. After you shampoo, rinse your hair and body thoroughly to remove the shampoo.  3. Wash Face and genitals (private parts)  with your normal soap.   4. Use CHG Soap as you would any other liquid soap. You can apply CHG directly to the skin and wash gently with a scrungie or a clean washcloth.   5. Apply the CHG Soap to your body ONLY FROM THE NECK DOWN.  Do not use on open wounds or open sores. Avoid contact with your eyes, ears, mouth and genitals (private parts). Wash Face and genitals (private parts)  with your normal soap.   6. Wash thoroughly, paying special attention to the area where your surgery will be performed.  7. Thoroughly rinse your body with warm water from the neck down.  8. DO NOT shower/wash with your normal soap after using and rinsing off the CHG Soap.  9. Pat yourself dry with a CLEAN TOWEL.  10. Wear CLEAN PAJAMAS to bed the night before surgery  11. Place CLEAN SHEETS on your bed the night before your surgery  12. DO NOT  SLEEP WITH PETS.  Day of Surgery: Shower with CHG soap Wear Clean/Comfortable clothing the morning of surgery Do not apply any deodorants/lotions.   Remember to brush your teeth WITH YOUR REGULAR TOOTHPASTE.   Please read over the following fact sheets that you were given.

## 2020-06-01 ENCOUNTER — Inpatient Hospital Stay: Payer: BC Managed Care – PPO | Attending: Hematology | Admitting: Hematology

## 2020-06-01 ENCOUNTER — Other Ambulatory Visit (HOSPITAL_COMMUNITY)
Admission: RE | Admit: 2020-06-01 | Discharge: 2020-06-01 | Disposition: A | Discharge status: Home / Self Care | Payer: BC Managed Care – PPO | Source: Ambulatory Visit

## 2020-06-01 ENCOUNTER — Encounter: Payer: Self-pay | Admitting: Hematology

## 2020-06-01 ENCOUNTER — Encounter (HOSPITAL_COMMUNITY)
Admission: RE | Admit: 2020-06-01 | Discharge: 2020-06-01 | Disposition: A | Discharge status: Home / Self Care | Payer: BC Managed Care – PPO | Source: Ambulatory Visit | Attending: General Surgery | Admitting: General Surgery

## 2020-06-01 ENCOUNTER — Encounter: Payer: Self-pay | Admitting: *Deleted

## 2020-06-01 ENCOUNTER — Other Ambulatory Visit: Payer: Self-pay

## 2020-06-01 ENCOUNTER — Encounter (HOSPITAL_COMMUNITY): Payer: Self-pay

## 2020-06-01 VITALS — BP 117/71 | HR 80 | Temp 98.7°F | Resp 18 | Wt 166.3 lb

## 2020-06-01 DIAGNOSIS — R896 Abnormal cytological findings in specimens from other organs, systems and tissues: Secondary | ICD-10-CM | POA: Diagnosis not present

## 2020-06-01 DIAGNOSIS — Z809 Family history of malignant neoplasm, unspecified: Secondary | ICD-10-CM | POA: Diagnosis not present

## 2020-06-01 DIAGNOSIS — Z170 Estrogen receptor positive status [ER+]: Secondary | ICD-10-CM

## 2020-06-01 DIAGNOSIS — Z01810 Encounter for preprocedural cardiovascular examination: Secondary | ICD-10-CM | POA: Diagnosis not present

## 2020-06-01 DIAGNOSIS — Z20822 Contact with and (suspected) exposure to covid-19: Secondary | ICD-10-CM | POA: Diagnosis not present

## 2020-06-01 DIAGNOSIS — Z01812 Encounter for preprocedural laboratory examination: Secondary | ICD-10-CM | POA: Insufficient documentation

## 2020-06-01 DIAGNOSIS — E0590 Thyrotoxicosis, unspecified without thyrotoxic crisis or storm: Secondary | ICD-10-CM | POA: Insufficient documentation

## 2020-06-01 DIAGNOSIS — R232 Flushing: Secondary | ICD-10-CM | POA: Diagnosis not present

## 2020-06-01 DIAGNOSIS — Z01818 Encounter for other preprocedural examination: Secondary | ICD-10-CM | POA: Insufficient documentation

## 2020-06-01 DIAGNOSIS — I10 Essential (primary) hypertension: Secondary | ICD-10-CM | POA: Insufficient documentation

## 2020-06-01 DIAGNOSIS — Z79899 Other long term (current) drug therapy: Secondary | ICD-10-CM | POA: Insufficient documentation

## 2020-06-01 DIAGNOSIS — R519 Headache, unspecified: Secondary | ICD-10-CM | POA: Insufficient documentation

## 2020-06-01 DIAGNOSIS — C50412 Malignant neoplasm of upper-outer quadrant of left female breast: Secondary | ICD-10-CM | POA: Insufficient documentation

## 2020-06-01 DIAGNOSIS — Z803 Family history of malignant neoplasm of breast: Secondary | ICD-10-CM | POA: Diagnosis not present

## 2020-06-01 DIAGNOSIS — C773 Secondary and unspecified malignant neoplasm of axilla and upper limb lymph nodes: Secondary | ICD-10-CM | POA: Diagnosis not present

## 2020-06-01 LAB — CBC WITH DIFFERENTIAL/PLATELET
Abs Immature Granulocytes: 0.03 10*3/uL (ref 0.00–0.07)
Basophils Absolute: 0.1 10*3/uL (ref 0.0–0.1)
Basophils Relative: 1 %
Eosinophils Absolute: 0.2 10*3/uL (ref 0.0–0.5)
Eosinophils Relative: 2 %
HCT: 42 % (ref 36.0–46.0)
Hemoglobin: 13.3 g/dL (ref 12.0–15.0)
Immature Granulocytes: 0 %
Lymphocytes Relative: 35 %
Lymphs Abs: 2.9 10*3/uL (ref 0.7–4.0)
MCH: 28 pg (ref 26.0–34.0)
MCHC: 31.7 g/dL (ref 30.0–36.0)
MCV: 88.4 fL (ref 80.0–100.0)
Monocytes Absolute: 0.7 10*3/uL (ref 0.1–1.0)
Monocytes Relative: 8 %
Neutro Abs: 4.5 10*3/uL (ref 1.7–7.7)
Neutrophils Relative %: 54 %
Platelets: 258 10*3/uL (ref 150–400)
RBC: 4.75 MIL/uL (ref 3.87–5.11)
RDW: 13.2 % (ref 11.5–15.5)
WBC: 8.4 10*3/uL (ref 4.0–10.5)
nRBC: 0 % (ref 0.0–0.2)

## 2020-06-01 LAB — COMPREHENSIVE METABOLIC PANEL
ALT: 14 U/L (ref 0–44)
AST: 15 U/L (ref 15–41)
Albumin: 3.8 g/dL (ref 3.5–5.0)
Alkaline Phosphatase: 67 U/L (ref 38–126)
Anion gap: 10 (ref 5–15)
BUN: 13 mg/dL (ref 6–20)
CO2: 26 mmol/L (ref 22–32)
Calcium: 9.6 mg/dL (ref 8.9–10.3)
Chloride: 102 mmol/L (ref 98–111)
Creatinine, Ser: 0.71 mg/dL (ref 0.44–1.00)
GFR, Estimated: >60 mL/min (ref >60)
Glucose, Bld: 96 mg/dL (ref 70–99)
Potassium: 4.1 mmol/L (ref 3.5–5.1)
Sodium: 138 mmol/L (ref 135–145)
Total Bilirubin: 0.4 mg/dL (ref 0.3–1.2)
Total Protein: 7.2 g/dL (ref 6.5–8.1)

## 2020-06-01 LAB — SARS CORONAVIRUS 2 (TAT 6-24 HRS): SARS Coronavirus 2: NEGATIVE

## 2020-06-01 MED ORDER — VENLAFAXINE HCL 37.5 MG PO TABS: 37.5000 mg | ORAL_TABLET | Freq: Two times a day (BID) | ORAL | 0 refills | Status: DC

## 2020-06-01 NOTE — Progress Notes (Addendum)
Anesthesia Chart Review:  Case: [id number] Date/Time: 06/03/20 0713   Procedure: LEFT BREAST LUMPECTOMY WITH RADIOACTIVE SEED AND SENTINEL LYMPH NODE BIOPSY (Left Breast) - RNFA, PEC BLOCK; START TIME OF 7:30 AM FOR 90 MINUTES ROOM 18   Anesthesia type: General   Pre-op diagnosis: LEFT BREAST CANCER   Location: [facility] OR ROOM 57 / [place] [facility]   Surgeons: [name], MD      DISCUSSION: Patient is a 55 year old female scheduled for the above procedure.  History includes never smoker, HTN, hyperthyroidism, breast cancer (05/17/20 left biopsy: invasive mammary carcinoma). + COVID-19 04/2019. BMI is consistent with obesity.   Had GYN visit on 05/09/20 with Earnstine Regal, PA-C on 05/10/19. Labs showed TSH 0.371 (0.450-4.50) with normal free T4 1.37 (0.82-1.77). Referred for mammogram for palpable left breast lump. Last visit with PCP Vivi Barrack, MD 05/16/20. Known low TSH. Recent T4 was normal. She has not been managed recently with medications (ie, methimazole) and hopes to avoid having to start. She is scheduled to see endocrinologist Dr. Renato Shin on 07/19/20.  BP 120/83, HR 77.   Preoperative labs and EKG acceptable.   [place] COVID-19 vaccine 01/25/20. Preprocedure COVID-19 test 06/01/20 is in process. RSL scheduled for 06/02/20 at 1:30 PM.    VS: BP 120/83   Pulse 77   Temp 37 C (Oral)   Resp 18   Ht 4\' 11"  (1.499 m)   Wt 75.3 kg   LMP 11/12/2011 Comment: pt denies preg  SpO2 100%   BMI 33.51 kg/m     PROVIDERS: Vivi Barrack, MD is PCP is PCP  Truitt Merle, MD is HEM-ONC. First appointment 06/01/20. Kyung Rudd, MD is RAD-ONC. First appointment 05/31/20.    LABS: Labs reviewed: Acceptable for surgery. (all labs ordered are listed, but only abnormal results are displayed)  Labs Reviewed  CBC WITH DIFFERENTIAL/PLATELET  COMPREHENSIVE METABOLIC PANEL    EKG: 10/25/99: NSR   CV: N/A   Past Medical History:  Diagnosis Date  . Abnormal pap 2009   PT HAD SURGERY BUT DON'T KNOW  WHAT TYPE  . Breast cancer ([place]) 04/27/2020  . Headache(784.0)    otc meds prn  . Hypertension   . Hyperthyroidism   . SVD (spontaneous vaginal delivery)    x 2    Past Surgical History:  Procedure Laterality Date  . FOOT SURGERY     left  . TUBAL LIGATION    . WISDOM TOOTH EXTRACTION      MEDICATIONS: . Cholecalciferol (VITAMIN D3) 1.25 MG (50000 UT) CAPS  . estradiol-norethindrone (COMBIPATCH) 0.05-0.14 MG/DAY  . lisinopril-hydrochlorothiazide (ZESTORETIC) 20-12.5 MG tablet   No current facility-administered medications for this encounter.    Myra Gianotti, PA-C Surgical Short Stay/Anesthesiology [facility] Phone [phone] [facility] Phone [phone] 06/01/2020 4:54 PM

## 2020-06-01 NOTE — Anesthesia Preprocedure Evaluation (Addendum)
Anesthesia Evaluation  Patient identified by MRN, date of birth, ID band Patient awake    Reviewed: Allergy & Precautions, NPO status , Patient's Chart, lab work & pertinent test results  Airway Mallampati: II  TM Distance: >3 FB Neck ROM: Full    Dental  (+) Teeth Intact   Pulmonary neg pulmonary ROS,    Pulmonary exam normal        Cardiovascular hypertension, Pt. on medications  Rhythm:Regular Rate:Normal     Neuro/Psych  Headaches, negative psych ROS   GI/Hepatic Neg liver ROS, GERD  ,  Endo/Other  Hyperthyroidism   Renal/GU negative Renal ROS  negative genitourinary   Musculoskeletal Left breast cancer   Abdominal (+)  Abdomen: soft. Bowel sounds: normal.  Peds  Hematology negative hematology ROS (+)   Anesthesia Other Findings   Reproductive/Obstetrics                           Anesthesia Physical Anesthesia Plan  ASA: III  Anesthesia Plan: General and Regional   Post-op Pain Management: GA combined w/ Regional for post-op pain   Induction: Intravenous  PONV Risk Score and Plan: 3 and Ondansetron, Dexamethasone, Midazolam and Treatment may vary due to age or medical condition  Airway Management Planned: Mask and LMA  Additional Equipment: None  Intra-op Plan:   Post-operative Plan: Extubation in OR  Informed Consent: I have reviewed the patients History and Physical, chart, labs and discussed the procedure including the risks, benefits and alternatives for the proposed anesthesia with the patient or authorized representative who has indicated his/her understanding and acceptance.     Dental advisory given  Plan Discussed with: CRNA  Anesthesia Plan Comments: (PAT note written 06/01/2020 by Myra Gianotti, PA-C. Lab Results      Component                Value               Date                      WBC                      8.4                 06/01/2020                 HGB                      13.3                06/01/2020                HCT                      42.0                06/01/2020                MCV                      88.4                06/01/2020                PLT  258                 06/01/2020           Lab Results      Component                Value               Date                      NA                       138                 06/01/2020                K                        4.1                 06/01/2020                CO2                      26                  06/01/2020                GLUCOSE                  96                  06/01/2020                BUN                      13                  06/01/2020                CREATININE               0.71                06/01/2020                CALCIUM                  9.6                 06/01/2020                GFRNONAA                 >60                 06/01/2020                GFRAA                    105                 05/09/2020          )      Anesthesia Quick Evaluation

## 2020-06-01 NOTE — Progress Notes (Signed)
PCP - Dr. Dimas Chyle  Cardiologist - denies  PPM/ICD - denies  Chest x-ray - N/A EKG - 06/01/2020 Stress Test - denies ECHO - denies Cardiac Cath - denies   Sleep Study - denies CPAP - N/A  DM: denies  Blood Thinner Instructions: N/A Aspirin Instructions: N/A  ERAS Protcol - Yes PRE-SURGERY Ensure or G2- Ensure given  COVID TEST- 06/01/2020, test results pending. Patient verbalized understanding of self-quarantine instructions, appointment time and place.   Anesthesia review: YES, seed placement scheduled for 06/02/2020  Patient denies shortness of breath, fever, cough and chest pain at PAT appointment  All instructions explained to the patient, with a verbal understanding of the material. Patient agrees to go over the instructions while at home for a better understanding. Patient also instructed to self quarantine after being tested for COVID-19. The opportunity to ask questions was provided.

## 2020-06-02 ENCOUNTER — Ambulatory Visit
Admission: RE | Admit: 2020-06-02 | Discharge: 2020-06-02 | Disposition: A | Discharge status: Home / Self Care | Payer: BC Managed Care – PPO | Source: Ambulatory Visit | Attending: General Surgery | Admitting: General Surgery

## 2020-06-02 DIAGNOSIS — Z170 Estrogen receptor positive status [ER+]: Secondary | ICD-10-CM

## 2020-06-02 DIAGNOSIS — C50412 Malignant neoplasm of upper-outer quadrant of left female breast: Secondary | ICD-10-CM

## 2020-06-03 ENCOUNTER — Ambulatory Visit (HOSPITAL_COMMUNITY)
Admission: RE | Admit: 2020-06-03 | Discharge: 2020-06-03 | Disposition: A | Discharge status: Home / Self Care | Payer: BC Managed Care – PPO | Source: Ambulatory Visit | Attending: General Surgery | Admitting: General Surgery

## 2020-06-03 ENCOUNTER — Ambulatory Visit
Admission: RE | Admit: 2020-06-03 | Discharge: 2020-06-03 | Disposition: A | Discharge status: Home / Self Care | Payer: BC Managed Care – PPO | Source: Ambulatory Visit | Attending: General Surgery | Admitting: General Surgery

## 2020-06-03 ENCOUNTER — Ambulatory Visit (HOSPITAL_COMMUNITY)
Admission: RE | Admit: 2020-06-03 | Discharge: 2020-06-03 | Disposition: A | Discharge status: Home / Self Care | Payer: BC Managed Care – PPO | Attending: General Surgery | Admitting: General Surgery

## 2020-06-03 ENCOUNTER — Encounter (HOSPITAL_COMMUNITY): Payer: Self-pay | Admitting: General Surgery

## 2020-06-03 ENCOUNTER — Ambulatory Visit (HOSPITAL_COMMUNITY): Payer: BC Managed Care – PPO | Admitting: Vascular Surgery

## 2020-06-03 ENCOUNTER — Encounter (HOSPITAL_COMMUNITY)
Admission: RE | Disposition: A | Discharge status: Home / Self Care | Payer: Self-pay | Source: Home / Self Care | Attending: General Surgery

## 2020-06-03 ENCOUNTER — Ambulatory Visit (HOSPITAL_COMMUNITY): Payer: BC Managed Care – PPO | Admitting: Anesthesiology

## 2020-06-03 ENCOUNTER — Other Ambulatory Visit: Payer: Self-pay

## 2020-06-03 DIAGNOSIS — C773 Secondary and unspecified malignant neoplasm of axilla and upper limb lymph nodes: Secondary | ICD-10-CM | POA: Insufficient documentation

## 2020-06-03 DIAGNOSIS — C50412 Malignant neoplasm of upper-outer quadrant of left female breast: Secondary | ICD-10-CM | POA: Diagnosis not present

## 2020-06-03 DIAGNOSIS — Z20822 Contact with and (suspected) exposure to covid-19: Secondary | ICD-10-CM | POA: Insufficient documentation

## 2020-06-03 DIAGNOSIS — Z170 Estrogen receptor positive status [ER+]: Secondary | ICD-10-CM

## 2020-06-03 DIAGNOSIS — Z01812 Encounter for preprocedural laboratory examination: Secondary | ICD-10-CM | POA: Insufficient documentation

## 2020-06-03 DIAGNOSIS — Z01810 Encounter for preprocedural cardiovascular examination: Secondary | ICD-10-CM | POA: Insufficient documentation

## 2020-06-03 DIAGNOSIS — Z809 Family history of malignant neoplasm, unspecified: Secondary | ICD-10-CM | POA: Insufficient documentation

## 2020-06-03 HISTORY — PX: BREAST LUMPECTOMY WITH RADIOACTIVE SEED AND SENTINEL LYMPH NODE BIOPSY: SHX6550

## 2020-06-03 SURGERY — BREAST LUMPECTOMY WITH RADIOACTIVE SEED AND SENTINEL LYMPH NODE BIOPSY
Anesthesia: Regional | Site: Breast | Laterality: Left

## 2020-06-03 MED ORDER — MIDAZOLAM HCL 2 MG/2ML IJ SOLN
INTRAMUSCULAR | Status: DC | PRN
  Administered 2020-06-03: 2 mg via INTRAVENOUS

## 2020-06-03 MED ORDER — DEXAMETHASONE SODIUM PHOSPHATE 10 MG/ML IJ SOLN
INTRAMUSCULAR | Status: DC | PRN
  Administered 2020-06-03: 5 mg

## 2020-06-03 MED ORDER — SODIUM CHLORIDE (PF) 0.9 % IJ SOLN
INTRAMUSCULAR | Status: AC
  Filled 2020-06-03: qty 10

## 2020-06-03 MED ORDER — DEXAMETHASONE SODIUM PHOSPHATE 10 MG/ML IJ SOLN
INTRAMUSCULAR | Status: DC | PRN
  Administered 2020-06-03: 8 mg via INTRAVENOUS

## 2020-06-03 MED ORDER — LIDOCAINE-EPINEPHRINE 1 %-1:100000 IJ SOLN
INTRAMUSCULAR | Status: DC | PRN
  Administered 2020-06-03: 20 mL

## 2020-06-03 MED ORDER — OXYCODONE HCL 5 MG PO TABS
ORAL_TABLET | ORAL | Status: AC
  Filled 2020-06-03: qty 1

## 2020-06-03 MED ORDER — BUPIVACAINE HCL 0.25 % IJ SOLN
INTRAMUSCULAR | Status: DC | PRN
  Administered 2020-06-03: 30 mL

## 2020-06-03 MED ORDER — MIDAZOLAM HCL 2 MG/2ML IJ SOLN
INTRAMUSCULAR | Status: AC
  Filled 2020-06-03: qty 2

## 2020-06-03 MED ORDER — TECHNETIUM TC 99M TILMANOCEPT KIT
1.0000 | PACK | Freq: Once | INTRAVENOUS | Status: AC | PRN
  Administered 2020-06-03: 1 via INTRADERMAL

## 2020-06-03 MED ORDER — CHLORHEXIDINE GLUCONATE CLOTH 2 % EX PADS: 6.0000 | MEDICATED_PAD | Freq: Once | CUTANEOUS | Status: DC

## 2020-06-03 MED ORDER — PROMETHAZINE HCL 25 MG/ML IJ SOLN: 6.2500 mg | INTRAMUSCULAR | Status: DC | PRN

## 2020-06-03 MED ORDER — FENTANYL CITRATE (PF) 250 MCG/5ML IJ SOLN
INTRAMUSCULAR | Status: DC | PRN
  Administered 2020-06-03: 25 ug via INTRAVENOUS
  Administered 2020-06-03 (×2): 50 ug via INTRAVENOUS
  Administered 2020-06-03 (×2): 25 ug via INTRAVENOUS
  Administered 2020-06-03: 50 ug via INTRAVENOUS
  Administered 2020-06-03: 25 ug via INTRAVENOUS

## 2020-06-03 MED ORDER — ONDANSETRON HCL 4 MG/2ML IJ SOLN
INTRAMUSCULAR | Status: DC | PRN
  Administered 2020-06-03: 4 mg via INTRAVENOUS

## 2020-06-03 MED ORDER — ACETAMINOPHEN 500 MG PO TABS: 1000.0000 mg | ORAL_TABLET | ORAL | Status: AC

## 2020-06-03 MED ORDER — LACTATED RINGERS IV SOLN: INTRAVENOUS | Status: DC

## 2020-06-03 MED ORDER — OXYCODONE HCL 5 MG/5ML PO SOLN: 5.0000 mg | Freq: Once | ORAL | Status: AC | PRN

## 2020-06-03 MED ORDER — PROPOFOL 10 MG/ML IV BOLUS
INTRAVENOUS | Status: AC
  Filled 2020-06-03: qty 20

## 2020-06-03 MED ORDER — ONDANSETRON HCL 4 MG/2ML IJ SOLN
INTRAMUSCULAR | Status: AC
  Filled 2020-06-03: qty 2

## 2020-06-03 MED ORDER — METHYLENE BLUE 0.5 % INJ SOLN
INTRAVENOUS | Status: AC
  Filled 2020-06-03: qty 10

## 2020-06-03 MED ORDER — LIDOCAINE 2% (20 MG/ML) 5 ML SYRINGE
INTRAMUSCULAR | Status: AC
  Filled 2020-06-03: qty 5

## 2020-06-03 MED ORDER — CEFAZOLIN SODIUM-DEXTROSE 2-4 GM/100ML-% IV SOLN
2.0000 g | INTRAVENOUS | Status: AC
  Administered 2020-06-03: 2 g via INTRAVENOUS

## 2020-06-03 MED ORDER — HYDROMORPHONE HCL 1 MG/ML IJ SOLN
INTRAMUSCULAR | Status: AC
  Filled 2020-06-03: qty 1

## 2020-06-03 MED ORDER — ORAL CARE MOUTH RINSE: 15.0000 mL | Freq: Once | OROMUCOSAL | Status: AC

## 2020-06-03 MED ORDER — HYDROMORPHONE HCL 1 MG/ML IJ SOLN
0.2500 mg | INTRAMUSCULAR | Status: DC | PRN
  Administered 2020-06-03 (×2): 0.25 mg via INTRAVENOUS
  Administered 2020-06-03: 0.5 mg via INTRAVENOUS

## 2020-06-03 MED ORDER — CEFAZOLIN SODIUM-DEXTROSE 2-4 GM/100ML-% IV SOLN
INTRAVENOUS | Status: AC
  Filled 2020-06-03: qty 100

## 2020-06-03 MED ORDER — OXYCODONE HCL 5 MG PO TABS
5.0000 mg | ORAL_TABLET | Freq: Once | ORAL | Status: AC | PRN
  Administered 2020-06-03: 5 mg via ORAL

## 2020-06-03 MED ORDER — DEXAMETHASONE SODIUM PHOSPHATE 10 MG/ML IJ SOLN
INTRAMUSCULAR | Status: AC
  Filled 2020-06-03: qty 1

## 2020-06-03 MED ORDER — BUPIVACAINE HCL (PF) 0.25 % IJ SOLN
INTRAMUSCULAR | Status: AC
  Filled 2020-06-03: qty 30

## 2020-06-03 MED ORDER — LIDOCAINE HCL (CARDIAC) PF 100 MG/5ML IV SOSY
PREFILLED_SYRINGE | INTRAVENOUS | Status: DC | PRN
  Administered 2020-06-03: 40 mg via INTRATRACHEAL

## 2020-06-03 MED ORDER — GLYCOPYRROLATE 0.2 MG/ML IJ SOLN
INTRAMUSCULAR | Status: DC | PRN
  Administered 2020-06-03 (×2): .1 mg via INTRAVENOUS

## 2020-06-03 MED ORDER — GLYCOPYRROLATE PF 0.2 MG/ML IJ SOSY
PREFILLED_SYRINGE | INTRAMUSCULAR | Status: AC
  Filled 2020-06-03: qty 1

## 2020-06-03 MED ORDER — ROPIVACAINE HCL 5 MG/ML IJ SOLN
INTRAMUSCULAR | Status: DC | PRN
  Administered 2020-06-03: 30 mL via PERINEURAL

## 2020-06-03 MED ORDER — AMISULPRIDE (ANTIEMETIC) 5 MG/2ML IV SOLN
10.0000 mg | Freq: Once | INTRAVENOUS | Status: AC | PRN
  Administered 2020-06-03: 10 mg via INTRAVENOUS

## 2020-06-03 MED ORDER — LIDOCAINE-EPINEPHRINE 1 %-1:100000 IJ SOLN
INTRAMUSCULAR | Status: AC
  Filled 2020-06-03: qty 1

## 2020-06-03 MED ORDER — ACETAMINOPHEN 500 MG PO TABS
ORAL_TABLET | ORAL | Status: AC
  Administered 2020-06-03: 1000 mg via ORAL
  Filled 2020-06-03: qty 2

## 2020-06-03 MED ORDER — ENSURE PRE-SURGERY PO LIQD: 296.0000 mL | Freq: Once | ORAL | Status: DC

## 2020-06-03 MED ORDER — CHLORHEXIDINE GLUCONATE 0.12 % MT SOLN
OROMUCOSAL | Status: AC
  Administered 2020-06-03: 15 mL via OROMUCOSAL
  Filled 2020-06-03: qty 15

## 2020-06-03 MED ORDER — OXYCODONE HCL 5 MG PO TABS: 5.0000 mg | ORAL_TABLET | ORAL | 0 refills | Status: DC | PRN

## 2020-06-03 MED ORDER — PROPOFOL 10 MG/ML IV BOLUS
INTRAVENOUS | Status: DC | PRN
  Administered 2020-06-03: 150 mg via INTRAVENOUS

## 2020-06-03 MED ORDER — FENTANYL CITRATE (PF) 250 MCG/5ML IJ SOLN
INTRAMUSCULAR | Status: AC
  Filled 2020-06-03: qty 5

## 2020-06-03 MED ORDER — AMISULPRIDE (ANTIEMETIC) 5 MG/2ML IV SOLN
INTRAVENOUS | Status: AC
  Filled 2020-06-03: qty 4

## 2020-06-03 MED ORDER — CHLORHEXIDINE GLUCONATE 0.12 % MT SOLN: 15.0000 mL | Freq: Once | OROMUCOSAL | Status: AC

## 2020-06-03 MED ORDER — 0.9 % SODIUM CHLORIDE (POUR BTL) OPTIME
TOPICAL | Status: DC | PRN
  Administered 2020-06-03: 1000 mL

## 2020-06-03 SURGICAL SUPPLY — 51 items
ADH SKN CLS APL DERMABOND .7 (GAUZE/BANDAGES/DRESSINGS) ×1
APL PRP STRL LF DISP 70% ISPRP (MISCELLANEOUS) ×1
BINDER BREAST LRG (GAUZE/BANDAGES/DRESSINGS) IMPLANT
BINDER BREAST XLRG (GAUZE/BANDAGES/DRESSINGS) ×1 IMPLANT
BNDG COHESIVE 4X5 TAN STRL (GAUZE/BANDAGES/DRESSINGS) ×2 IMPLANT
CANISTER SUCT 3000ML PPV (MISCELLANEOUS) ×2 IMPLANT
CHLORAPREP W/TINT 26 (MISCELLANEOUS) ×2 IMPLANT
CLIP VESOCCLUDE LG 6/CT (CLIP) ×2 IMPLANT
CLIP VESOCCLUDE MED 6/CT (CLIP) ×2 IMPLANT
CLIP VESOCCLUDE SM WIDE 6/CT (CLIP) ×2 IMPLANT
CNTNR URN SCR LID CUP LEK RST (MISCELLANEOUS) IMPLANT
CONT SPEC 4OZ STRL OR WHT (MISCELLANEOUS)
COVER PROBE W GEL 5X96 (DRAPES) ×2 IMPLANT
COVER SURGICAL LIGHT HANDLE (MISCELLANEOUS) ×2 IMPLANT
COVER WAND RF STERILE (DRAPES) ×2 IMPLANT
DERMABOND ADVANCED (GAUZE/BANDAGES/DRESSINGS) ×1
DERMABOND ADVANCED .7 DNX12 (GAUZE/BANDAGES/DRESSINGS) ×1 IMPLANT
DEVICE DUBIN SPECIMEN MAMMOGRA (MISCELLANEOUS) IMPLANT
DRAPE CHEST BREAST 15X10 FENES (DRAPES) ×2 IMPLANT
DRAPE SURG 17X23 STRL (DRAPES) IMPLANT
DRSG PAD ABDOMINAL 8X10 ST (GAUZE/BANDAGES/DRESSINGS) ×1 IMPLANT
ELECT COATED BLADE 2.86 ST (ELECTRODE) ×2 IMPLANT
ELECT NDL BLADE 2-5/6 (NEEDLE) ×1 IMPLANT
ELECT NEEDLE BLADE 2-5/6 (NEEDLE) ×2 IMPLANT
ELECT REM PT RETURN 9FT ADLT (ELECTROSURGICAL) ×2
ELECTRODE REM PT RTRN 9FT ADLT (ELECTROSURGICAL) ×1 IMPLANT
GLOVE BIO SURGEON STRL SZ 6 (GLOVE) ×2 IMPLANT
GLOVE SURG UNDER LTX SZ6.5 (GLOVE) ×2 IMPLANT
GOWN STRL REUS W/ TWL LRG LVL3 (GOWN DISPOSABLE) ×1 IMPLANT
GOWN STRL REUS W/TWL 2XL LVL3 (GOWN DISPOSABLE) ×2 IMPLANT
GOWN STRL REUS W/TWL LRG LVL3 (GOWN DISPOSABLE) ×4
KIT BASIN OR (CUSTOM PROCEDURE TRAY) ×2 IMPLANT
KIT MARKER MARGIN INK (KITS) ×2 IMPLANT
LIGHT WAVEGUIDE WIDE FLAT (MISCELLANEOUS) ×1 IMPLANT
NDL 18GX1X1/2 (RX/OR ONLY) (NEEDLE) IMPLANT
NDL FILTER BLUNT 18X1 1/2 (NEEDLE) IMPLANT
NDL HYPO 25GX1X1/2 BEV (NEEDLE) ×1 IMPLANT
NEEDLE 18GX1X1/2 (RX/OR ONLY) (NEEDLE) IMPLANT
NEEDLE FILTER BLUNT 18X 1/2SAF (NEEDLE)
NEEDLE FILTER BLUNT 18X1 1/2 (NEEDLE) IMPLANT
NEEDLE HYPO 25GX1X1/2 BEV (NEEDLE) ×2 IMPLANT
NS IRRIG 1000ML POUR BTL (IV SOLUTION) ×2 IMPLANT
PACK GENERAL/GYN (CUSTOM PROCEDURE TRAY) ×2 IMPLANT
PACK UNIVERSAL I (CUSTOM PROCEDURE TRAY) ×2 IMPLANT
STOCKINETTE IMPERVIOUS 9X36 MD (GAUZE/BANDAGES/DRESSINGS) ×2 IMPLANT
STRIP CLOSURE SKIN 1/2X4 (GAUZE/BANDAGES/DRESSINGS) ×1 IMPLANT
SUT MNCRL AB 4-0 PS2 18 (SUTURE) ×3 IMPLANT
SUT VIC AB 3-0 SH 8-18 (SUTURE) ×2 IMPLANT
SYR CONTROL 10ML LL (SYRINGE) ×2 IMPLANT
TOWEL GREEN STERILE (TOWEL DISPOSABLE) ×2 IMPLANT
TOWEL GREEN STERILE FF (TOWEL DISPOSABLE) ×2 IMPLANT

## 2020-06-03 NOTE — Transfer of Care (Signed)
Immediate Anesthesia Transfer of Care Note  Patient: Sherry Carrillo  Procedure(s) Performed: RADIOACTIVE SEED GUIDED LEFT BREAST LUMPECTOMY, LEFT AXILLARY SENTINEL LYMPH NODE BIOPSY (Left Breast)  Patient Location: PACU  Anesthesia Type:General  Level of Consciousness: drowsy and patient cooperative  Airway & Oxygen Therapy: Patient Spontanous Breathing and Patient connected to face mask oxygen  Post-op Assessment: Report given to RN and Post -op Vital signs reviewed and stable  Post vital signs: Reviewed and stable  Last Vitals:  Vitals Value Taken Time  BP 152/90 06/03/20 0950  Temp    Pulse 100 06/03/20 0950  Resp 13 06/03/20 0950  SpO2 100 % 06/03/20 0950  Vitals shown include unvalidated device data.  Last Pain:  Vitals:   06/03/20 0606  TempSrc: Oral  PainSc: 2          Complications: No complications documented.

## 2020-06-03 NOTE — Anesthesia Procedure Notes (Signed)
Anesthesia Regional Block: Pectoralis block   Pre-Anesthetic Checklist: ,, timeout performed, Correct Patient, Correct Site, Correct Laterality, Correct Procedure, Correct Position, site marked, Risks and benefits discussed,  Surgical consent,  Pre-op evaluation,  At surgeon's request and post-op pain management  Laterality: Left  Prep: Dura Prep       Needles:  Injection technique: Single-shot  Needle Type: Echogenic Stimulator Needle     Needle Length: 10cm  Needle Gauge: 20     Additional Needles:   Procedures:,,,, ultrasound used (permanent image in chart),,,,  Narrative:  Start time: 06/03/2020 6:55 AM End time: 06/03/2020 7:00 AM Injection made incrementally with aspirations every 5 mL.  Performed by: Personally  Anesthesiologist: Darral Dash, DO  Additional Notes: Patient identified. Risks/Benefits/Options discussed with patient including but not limited to bleeding, infection, nerve damage, failed block, incomplete pain control. Patient expressed understanding and wished to proceed. All questions were answered. Sterile technique was used throughout the entire procedure. Please see nursing notes for vital signs. Aspirated in 5cc intervals with injection for negative confirmation. Patient was given instructions on fall risk and not to get out of bed. All questions and concerns addressed with instructions to call with any issues or inadequate analgesia.

## 2020-06-03 NOTE — Anesthesia Postprocedure Evaluation (Signed)
Anesthesia Post Note  Patient: Sherry Carrillo  Procedure(s) Performed: RADIOACTIVE SEED GUIDED LEFT BREAST LUMPECTOMY, LEFT AXILLARY SENTINEL LYMPH NODE BIOPSY (Left Breast)     Patient location during evaluation: PACU Anesthesia Type: Regional and General Level of consciousness: awake and alert Pain management: pain level controlled Vital Signs Assessment: post-procedure vital signs reviewed and stable Respiratory status: spontaneous breathing, nonlabored ventilation, respiratory function stable and patient connected to nasal cannula oxygen Cardiovascular status: blood pressure returned to baseline and stable Postop Assessment: no apparent nausea or vomiting Anesthetic complications: no   No complications documented.  Last Vitals:  Vitals:   06/03/20 1050 06/03/20 1105  BP: 134/80 136/75  Pulse: 69 76  Resp: 19 16  Temp:  (!) 36.2 C  SpO2: 99% 98%    Last Pain:  Vitals:   06/03/20 1105  TempSrc:   PainSc: Asleep                 Belenda Cruise P Euel Castile

## 2020-06-03 NOTE — Anesthesia Procedure Notes (Signed)
Procedure Name: LMA Insertion Date/Time: 06/03/2020 8:02 AM Performed by: Kathryne Hitch, CRNA Pre-anesthesia Checklist: Patient identified, Emergency Drugs available, Suction available and Patient being monitored Patient Re-evaluated:Patient Re-evaluated prior to induction Oxygen Delivery Method: Circle system utilized Preoxygenation: Pre-oxygenation with 100% oxygen Induction Type: IV induction Ventilation: Mask ventilation without difficulty LMA: LMA inserted LMA Size: 4.0 Number of attempts: 1 Placement Confirmation: positive ETCO2 Tube secured with: Tape Dental Injury: Teeth and Oropharynx as per pre-operative assessment

## 2020-06-03 NOTE — Interval H&P Note (Signed)
History and Physical Interval Note:  06/03/2020 7:30 AM  Sherry Carrillo  has presented today for surgery, with the diagnosis of LEFT BREAST CANCER.  The various methods of treatment have been discussed with the patient and family. After consideration of risks, benefits and other options for treatment, the patient has consented to  Procedure(s) with comments: LEFT BREAST LUMPECTOMY WITH RADIOACTIVE SEED AND SENTINEL LYMPH NODE BIOPSY (Left) - RNFA, PEC BLOCK; START TIME OF 7:30 AM FOR 90 MINUTES ROOM 18 as a surgical intervention.  The patient's history has been reviewed, patient examined, no change in status, stable for surgery.  I have reviewed the patient's chart and labs.  Questions were answered to the patient's satisfaction.     [name]

## 2020-06-03 NOTE — Op Note (Signed)
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 The risks, benefits, complications, treatment options, and expected outcomes were discussed with the patient. The possibilities of bleeding, infection, the need for additional procedures, failure to diagnose a condition, and creating a complication requiring transfusion or operation were discussed with the patient. The patient concurred with the proposed plan, giving informed consent.  The site of surgery properly noted/marked. The patient was taken to Operating Room # 18, identified, and the procedure verified as left Breast Seed localized Lumpectomy with sentinel lymph node biopsy. A Time Out was held and the above information confirmed.  The left arm, breast, and chest were prepped and draped in standard fashion. The lumpectomy was performed by creating an axillary incision near the previously placed radioactive seed.  Dissection was carried down around the point of maximum signal intensity. The cautery was used to perform the dissection.  Hemostasis was achieved with cautery. The edges of the cavity were marked with large clips, with one each medial, lateral, inferior and superior, and posteriorly.   The specimen was inked with the margin marker paint kit.    Specimen radiography confirmed inclusion of the mammographic lesion, the clip, and the seed.  The background signal in the breast was zero.    Using a hand-held gamma probe, left axillary sentinel nodes were identified.  The same incision was used. Dissection was carried through the  clavipectoral fascia.  Two deep level 2 axillary sentinel nodes were removed.  Counts per second were 490 and 260.    The background count was 12 cps.  The wound was irrigated.  Hemostasis was achieved with cautery.  The axillary incision was closed with a 3-0 vicryl deep dermal interrupted sutures and a 4-0 monocryl subcuticular closure.    Sterile dressings were applied. At the end of the operation, all sponge, instrument, and needle counts were correct.  Findings: grossly clear surgical margins and no adenopathy, anterior margin is skin, posterior margin is pectoralis.  Estimated Blood Loss:  min         Specimens: Left breast tissue with seed and two left axillary sentinel lymph nodes.             Complications:  None; patient tolerated the procedure well.         Disposition: PACU - hemodynamically stable.         Condition: stable

## 2020-06-03 NOTE — H&P (Signed)
Sherry Carrillo Appointment: 05/24/2020 3:00 PM Location: [facility] Patient #: [id number] DOB: 1965/06/14 Married / Language: English / Race: American [place] or [place] Native Female   History of Present Illness Sherry Carrillo; 05/24/2020 4:42 PM) The patient is a 55 year old female who presents with breast cancer. Pt is a 55 yo F who is referred by Sherry Carrillo for a new diagnosis of left breast cancer. She had screening detected mass wtih distortion. Diagnostic imaging showed a 2.3 cm mass at 1 o'clock on the left. 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 She is sore from the bruising.   She had a maternal aunt who had breast cancer. Her mother had lung cancer that went to her liver. She hasn't had a diagnosis of cancer before this. She is a G2P2 wtih menarche at age 74 and is perimenopausal now.    dx mammogram/us 05/17/2020 EXAM: DIGITAL DIAGNOSTIC BILATERAL MAMMOGRAM WITH TOMOSYNTHESIS AND CAD; ULTRASOUND LEFT BREAST LIMITED  TECHNIQUE: Bilateral digital diagnostic mammography and breast tomosynthesis was performed. The images were evaluated with computer-aided detection.; Targeted ultrasound examination of the left breast was performed  COMPARISON: Previous exam(s).  ACR Breast Density Category b: There are scattered areas of fibroglandular density.  FINDINGS: RIGHT: No findings suspicious for malignancy.  LEFT: Irregular hyperdense mass associated with architectural distortion involving the UPPER OUTER QUADRANT at POSTERIOR depth, measuring approximately 2-2.5 cm. There is no associated calcifications. No suspicious findings elsewhere.  Targeted ultrasound is performed, showing an irregular hypoechoic mass with a hyperechoic halo at the 1 o'clock position approximately 8 cm from the nipple, measuring approximately 2.3 x 1.6 x 2.0 cm, demonstrating slight posterior acoustic shadowing and  demonstrating internal power Doppler flow.  Sonographic evaluation of the LEFT axilla demonstrates no pathologic lymphadenopathy.  On correlative physical exam, there is an approximate 2-3 cm firm palpable lump in the UPPER OUTER QUADRANT of the LEFT breast.  IMPRESSION: 1. Highly suspicious 2.3 cm mass involving the UPPER OUTER QUADRANT of the LEFT breast at the 1 o'clock position approximately 8 cm from the nipple. 2. No pathologic LEFT axillary lymphadenopathy. 3. No mammographic evidence of malignancy involving the RIGHT breast.  RECOMMENDATION: Ultrasound-guided core needle biopsy of the LEFT breast mass.  The ultrasound biopsy procedure was discussed with the patient and her questions were answered. She wishes to proceed and the biopsy has been scheduled for later today at 1:30 p.m.  I have discussed the findings and recommendations with the patient.  BI-RADS CATEGORY 5: Highly suggestive of malignancy.   pathology 05/17/2020 Breast, left, needle core biopsy, 1 o'clock - INVASIVE MAMMARY CARCINOMA The tumor cells are NEGATIVE for Her2 (1+). Estrogen Receptor: 90%, POSITIVE, STRONG STAINING INTENSITY Progesterone Receptor: 60%, POSITIVE, STRONG STAINING INTENSITY Proliferation Marker Ki67: 70%    Past Surgical History Sherry Carrillo; 05/24/2020 3:30 PM) Breast Biopsy  Left.  Diagnostic Studies History Sherry Carrillo; 05/24/2020 3:30 PM) Colonoscopy  never Mammogram  within last year Pap Smear  1-5 years ago  Allergies Sherry Carrillo; 05/24/2020 3:30 PM) Sulfa Drugs  Allergies Reconciled   Medication History (Sherry Carrillo; 05/24/2020 3:30 PM) Lisinopril-hydroCHLOROthiazide (20-12.5MG Tablet, Oral) Active. Medications Reconciled  Social History Sherry Carrillo; 05/24/2020 3:30 PM) Caffeine use  Tea. No alcohol use  No drug use  Tobacco use  Never smoker.  Family History Sherry Carrillo; 05/24/2020 3:30 PM) Cancer  Mother. Heart Disease  Father. Heart disease in female  family member before age 33  Heart disease in female family member before age 51  Hypertension  Father, Mother. Respiratory Condition  Mother. Thyroid problems  Sister.  Pregnancy / Birth History Sherry Carrillo; 05/24/2020 3:30 PM) Age at menarche  25 years. Age of menopause  16-55 Gravida  2 Irregular periods  Length (months) of breastfeeding  3-6 Maternal age  23-20 Para  2  Other Problems Sherry Carrillo; 05/24/2020 3:30 PM) Breast Cancer  High blood pressure  Lump In Breast     Review of Systems (Sherry Carrillo Carrillo; 05/24/2020 3:30 PM) General Not Present- Appetite Loss, Chills, Fatigue, Fever, Night Sweats, Weight Gain and Weight Loss. Skin Not Present- Change in Wart/Mole, Dryness, Hives, Jaundice, New Lesions, Non-Healing Wounds, Rash and Ulcer. HEENT Present- Wears glasses/contact lenses. Not Present- Earache, Hearing Loss, Hoarseness, Nose Bleed, Oral Ulcers, Ringing in the Ears, Seasonal Allergies, Sinus Pain, Sore Throat, Visual Disturbances and Yellow Eyes. Breast Present- Breast Mass. Not Present- Breast Pain, Nipple Discharge and Skin Changes. Cardiovascular Not Present- Chest Pain, Difficulty Breathing Lying Down, Leg Cramps, Palpitations, Rapid Heart Rate, Shortness of Breath and Swelling of Extremities. Gastrointestinal Not Present- Abdominal Pain, Bloating, Bloody Stool, Change in Bowel Habits, Chronic diarrhea, Constipation, Difficulty Swallowing, Excessive gas, Gets full quickly at meals, Hemorrhoids, Indigestion, Nausea, Rectal Pain and Vomiting. Female Genitourinary Not Present- Frequency, Nocturia, Painful Urination, Pelvic Pain and Urgency. Neurological Not Present- Decreased Memory, Fainting, Headaches, Numbness, Seizures, Tingling, Tremor, Trouble walking and Weakness. Psychiatric Not Present- Anxiety,  Bipolar, Change in Sleep Pattern, Depression, Fearful and Frequent crying. Endocrine Present- Hot flashes. Not Present- Cold Intolerance, Excessive Hunger, Hair Changes, Heat Intolerance and New Diabetes. Hematology Not Present- Blood Thinners, Easy Bruising, Excessive bleeding, Gland problems, HIV and Persistent Infections.  Vitals (Sherry Carrillo Carrillo; 05/24/2020 3:31 PM) 05/24/2020 3:30 PM Weight: 164.13 lb Height: 60in Body Surface Area: 1.72 m Body Mass Index: 32.05 kg/m  Temp.: 97.35F  Pulse: 94 (Regular)  P.OX: 99% (Room air) BP: 120/82(Sitting, Left Arm, Standard)       Physical Exam Sherry Carrillo; 05/24/2020 4:43 PM) General Mental Status-Alert. General Appearance-Consistent with stated age. Hydration-Well hydrated. Voice-Normal.  Head and Neck Head-normocephalic, atraumatic with no lesions or palpable masses. Trachea-midline. Thyroid Gland Characteristics - normal size and consistency.  Eye Eyeball - Bilateral-Extraocular movements intact. Sclera/Conjunctiva - Bilateral-No scleral icterus.  Chest and Lung Exam Chest and lung exam reveals -quiet, even and easy respiratory effort with no use of accessory muscles and on auscultation, normal breath sounds, no adventitious sounds and normal vocal resonance. Inspection Chest Wall - Normal. Back - normal.  Breast Note: bruising left breast upper outer quadrant. No palpable mass in either breast. left breast is slightly larger. No nipple retraction or nipple discharge. No LAD.   Cardiovascular Cardiovascular examination reveals -normal heart sounds, regular rate and rhythm with no murmurs and normal pedal pulses bilaterally.  Abdomen Inspection Inspection of the abdomen reveals - No Hernias. Palpation/Percussion Palpation and Percussion of the abdomen reveal - Soft, Non Tender, No Rebound tenderness, No Rigidity (guarding) and No hepatosplenomegaly. Auscultation Auscultation  of the abdomen reveals - Bowel sounds normal.  Neurologic Neurologic evaluation reveals -alert and oriented x 3 with no impairment of recent or remote memory. Mental Status-Normal.  Musculoskeletal Global Assessment -Note: no gross deformities.  Normal Exam - Left-Upper Extremity Strength Normal and Lower Extremity Strength Normal. Normal Exam - Right-Upper Extremity Strength Normal and Lower Extremity Strength Normal.  Lymphatic Head & Neck  General Head &  Neck Lymphatics: Bilateral - Description - Normal. Axillary  General Axillary Region: Bilateral - Description - Normal. Tenderness - Non Tender. Femoral & Inguinal  Generalized Femoral & Inguinal Lymphatics: Bilateral - Description - No Generalized lymphadenopathy.    Assessment & Plan Sherry Carrillo; 05/24/2020 4:45 PM) MALIGNANT NEOPLASM OF UPPER-OUTER QUADRANT OF LEFT BREAST IN FEMALE, ESTROGEN RECEPTOR POSITIVE (C50.412) Impression: Pt has a new diagnosis of cT2N0 left breast cancer. Will plan seed localized lumpectomy wtih sentinel lymph node biopsy.  I discussed that mastectomy is also an option but longer recovery. This would be followed by radiation, oncotype/mammaprint, and antihormone tx. She has a trip this weekend as well as 4/16, and I discussed the option of taking neoadjuvant tamoxifen and doing surgery after that. She would prefer to do surgery first.  The surgical procedure was described to the patient. I discussed the incision type and location and that we would need radiology involved on with a wire or seed marker and/or sentinel node.  The risks and benefits of the procedure were described to the patient and she wishes to proceed.  We discussed the risks bleeding, infection, damage to other structures, need for further procedures/surgeries. We discussed the risk of seroma. The patient was advised if the area in the breast in cancer, we may need to go back to surgery for additional tissue to obtain  negative margins or for a lymph node biopsy. The patient was advised that these are the most common complications, but that others can occur as well. They were advised against taking aspirin or other anti-inflammatory agents/blood thinners the week before surgery. Current Plans You are being scheduled for surgery- Our schedulers will call you.  You should hear from our office's scheduling department within 5 working days about the location, date, and time of surgery. We try to make accommodations for patient's preferences in scheduling surgery, but sometimes the OR schedule or the surgeon's schedule prevents [place] from making those accommodations.  If you have not heard from our office [phone]) in 5 working days, call the office and ask for your surgeon's nurse.  If you have other questions about your diagnosis, plan, or surgery, call the office and ask for your surgeon's nurse.  Referred to Oncology, for evaluation and follow up (Oncology). Routine. Referred to Radiation Oncology, for evaluation and follow up (Radiation Oncology). Routine.   Signed electronically by Sherry Klein, Carrillo (05/24/2020 4:45 PM)

## 2020-06-03 NOTE — Discharge Instructions (Addendum)
[facility] °Office Phone Number [phone] ° °BREAST BIOPSY/ PARTIAL MASTECTOMY: POST OP INSTRUCTIONS ° °Always review your discharge instruction sheet given to you by the facility where your surgery was performed. ° °IF YOU HAVE DISABILITY OR FAMILY LEAVE FORMS, YOU MUST BRING THEM TO THE OFFICE FOR PROCESSING.  DO NOT GIVE THEM TO YOUR DOCTOR. ° °1. A prescription for pain medication may be given to you upon discharge.  Take your pain medication as prescribed, if needed.  If narcotic pain medicine is not needed, then you may take acetaminophen (Tylenol) or ibuprofen (Advil) as needed. °2. Take your usually prescribed medications unless otherwise directed °3. If you need a refill on your pain medication, please contact your pharmacy.  They will contact our office to request authorization.  Prescriptions will not be filled after 5pm or on week-ends. °4. You should eat very light the first 24 hours after surgery, such as soup, crackers, pudding, etc.  Resume your normal diet the day after surgery. °5. Most patients will experience some swelling and bruising in the breast.  Ice packs and a good support bra will help.  Swelling and bruising can take several days to resolve.  °6. It is common to experience some constipation if taking pain medication after surgery.  Increasing fluid intake and taking a stool softener will usually help or prevent this problem from occurring.  A mild laxative (Milk of Magnesia or Miralax) should be taken according to package directions if there are no bowel movements after 48 hours. °7. Unless discharge instructions indicate otherwise, you may remove your bandages 48 hours after surgery, and you may shower at that time.  You may have steri-strips (small skin tapes) in place directly over the incision.  These strips should be left on the skin for 7-10 days.   Any sutures or staples will be removed at the office during your follow-up visit. °8. ACTIVITIES:  You may resume  regular daily activities (gradually increasing) beginning the next day.  Wearing a good support bra or sports bra (or the breast binder) minimizes pain and swelling.  You may have sexual intercourse when it is comfortable. °a. You may drive when you no longer are taking prescription pain medication, you can comfortably wear a seatbelt, and you can safely maneuver your car and apply brakes. °b. RETURN TO WORK:  __________1 week_______________ °9. You should see your doctor in the office for a follow-up appointment approximately two weeks after your surgery.  Your doctor’s nurse will typically make your follow-up appointment when she calls you with your pathology report.  Expect your pathology report 2-3 business days after your surgery.  You may call to check if you do not hear from us after three days. ° ° °WHEN TO CALL YOUR DOCTOR: °1. Fever over 101.0 °2. Nausea and/or vomiting. °3. Extreme swelling or bruising. °4. Continued bleeding from incision. °5. Increased pain, redness, or drainage from the incision. ° °The clinic staff is available to answer your questions during regular business hours.  Please don’t hesitate to call and ask to speak to one of the nurses for clinical concerns.  If you have a medical emergency, go to the nearest emergency room or call 911.  A surgeon from [facility] is always on call at the hospital. ° °For further questions, please visit [email or web address]  ° °

## 2020-06-04 ENCOUNTER — Encounter (HOSPITAL_COMMUNITY): Payer: Self-pay | Admitting: General Surgery

## 2020-06-07 ENCOUNTER — Other Ambulatory Visit: Payer: Self-pay

## 2020-06-07 LAB — SURGICAL PATHOLOGY

## 2020-06-09 ENCOUNTER — Telehealth: Payer: Self-pay | Admitting: *Deleted

## 2020-06-09 ENCOUNTER — Encounter: Payer: Self-pay | Admitting: *Deleted

## 2020-06-09 NOTE — Telephone Encounter (Signed)
Received order for Mammaprint testing order. Requisition faxed to [facility] and pathology

## 2020-06-22 ENCOUNTER — Telehealth: Payer: Self-pay

## 2020-06-22 ENCOUNTER — Encounter: Payer: Self-pay | Admitting: *Deleted

## 2020-06-22 NOTE — Telephone Encounter (Signed)
I left message for Sherry Carrillo letting her know Dr Marlowe Aschoff office will be reaching out to her to schedule an appt.

## 2020-06-22 NOTE — Telephone Encounter (Signed)
-----   Message from Truitt Merle, MD sent at 06/22/2020  9:38 AM EDT ----- We discussed her case in conference this morning, Dr. Barry Dienes will see her and review her path and re-excision surgery. Santiago Glad, please let her know, thanks   ----- Message ----- From: Mauro Kaufmann, RN Sent: 06/21/2020   9:40 PM EDT To: [name], MD, Rockwell Germany, RN, #  Yes, it should be back on Thurs or Fri of this week Dawn  ----- Message ----- From: Truitt Merle, MD Sent: 06/21/2020   5:17 PM EDT To: [name], MD, Rockwell Germany, RN, #  Varney Biles and Arrie Aran,  Did we send mammaprint on her surgical sample? Pt called and asked about her surgical path result and what's next.   Dorris Fetch, have you seen her or talked to her after surgery? Do you plan to do re-excision for positive margin?  Thanks  Krista Blue

## 2020-06-24 ENCOUNTER — Other Ambulatory Visit: Payer: BC Managed Care – PPO

## 2020-06-27 ENCOUNTER — Other Ambulatory Visit: Payer: Self-pay | Admitting: General Surgery

## 2020-06-28 ENCOUNTER — Telehealth: Payer: Self-pay

## 2020-06-28 ENCOUNTER — Telehealth: Payer: Self-pay | Admitting: *Deleted

## 2020-06-28 NOTE — Telephone Encounter (Signed)
Spoke with patient to give her appointment to see Dr. Burr Medico 4/11 at 10am. Patient had a lot of questions regarding her plan and possibly need for chemo, which she is not keen on doing. Informed her I would call Agendia tomorrow am for an update on when results would be ready and she understands that her appointment on 4/11 may need to moved if results are not back yet. Patient verbalized understanding.

## 2020-06-28 NOTE — Telephone Encounter (Signed)
Sherry Carrillo called our schedulers for an appt with Dr Burr Medico.  I spoke with Sherry Carrillo previously to let her know Dr Marlowe Aschoff office will be reaching out to her.  I left a message for Sherry Carrillo to return my call.

## 2020-06-29 ENCOUNTER — Telehealth: Payer: Self-pay | Admitting: *Deleted

## 2020-06-29 ENCOUNTER — Encounter: Payer: Self-pay | Admitting: *Deleted

## 2020-06-29 NOTE — Telephone Encounter (Signed)
Received mammaprint results of high risk. Patient is aware and again had a long discussion with her about chemo being recommended. We did discuss dignicap and I will leave her the information for when she comes in tomorrow. R/S her appointment from 4/11 to 4/7 at 9am and she is aware.  She is still waiting on an appointment at [facility] for a 2nd opinion.

## 2020-06-30 ENCOUNTER — Other Ambulatory Visit: Payer: Self-pay

## 2020-06-30 ENCOUNTER — Inpatient Hospital Stay: Payer: BC Managed Care – PPO | Attending: Hematology | Admitting: Hematology

## 2020-06-30 ENCOUNTER — Encounter: Payer: Self-pay | Admitting: Hematology

## 2020-06-30 VITALS — BP 120/81 | HR 78 | Temp 97.8°F | Resp 19 | Ht 59.0 in | Wt 164.1 lb

## 2020-06-30 DIAGNOSIS — Z803 Family history of malignant neoplasm of breast: Secondary | ICD-10-CM | POA: Diagnosis not present

## 2020-06-30 DIAGNOSIS — C50412 Malignant neoplasm of upper-outer quadrant of left female breast: Secondary | ICD-10-CM | POA: Diagnosis present

## 2020-06-30 DIAGNOSIS — Z170 Estrogen receptor positive status [ER+]: Secondary | ICD-10-CM | POA: Insufficient documentation

## 2020-06-30 DIAGNOSIS — I10 Essential (primary) hypertension: Secondary | ICD-10-CM | POA: Insufficient documentation

## 2020-06-30 DIAGNOSIS — R232 Flushing: Secondary | ICD-10-CM | POA: Insufficient documentation

## 2020-06-30 DIAGNOSIS — R51 Headache with orthostatic component, not elsewhere classified: Secondary | ICD-10-CM | POA: Diagnosis not present

## 2020-06-30 NOTE — Progress Notes (Signed)
Sherry Carrillo   [phone] [phone]   Clinic Follow up Note   Patient Care Team: Vivi Barrack, MD as PCP - General (Family Medicine) Mauro Kaufmann, RN as Oncology Nurse Navigator Rockwell Germany, RN as Oncology Nurse Navigator [name], MD as Consulting Physician (General Surgery) Kyung Rudd, MD as Consulting Physician (Radiation Oncology) Truitt Merle, MD as Consulting Physician (Hematology)  Date of Service:  06/30/2020  CHIEF COMPLAINT: left breast cancer  SUMMARY OF ONCOLOGIC HISTORY: Oncology History Overview Note  Cancer Staging Malignant neoplasm of upper-outer quadrant of left breast, estrogen receptor positive ([place]) Staging form: Breast, AJCC 8th Edition - Clinical stage from 05/31/2020: Stage IIA (cT2, cN0, cM0, G3, ER+, PR+, HER2-) - Signed by Truitt Merle, MD on 06/01/2020 Stage prefix: Initial diagnosis    Malignant neoplasm of upper-outer quadrant of left breast, estrogen receptor positive ([place])  05/17/2020 Mammogram   IMPRESSION: 1. Highly suspicious 2.3 cm mass involving the UPPER OUTER QUADRANT of the LEFT breast at the 1 o'clock position approximately 8 cm from the nipple. 2. No pathologic LEFT axillary lymphadenopathy. 3. No mammographic evidence of malignancy involving the RIGHT breast.   05/17/2020 Initial Biopsy   Diagnosis Breast, left, needle core biopsy, 1 o'clock - INVASIVE MAMMARY CARCINOMA.  Microscopic Comment The carcinoma appears grade 3. The greatest linear extent of tumor in any one core is 10 mm. E-cadherin will be reported separately. Ancillary studies will be reported separately. Results reported to [facility] on 05/18/2020. Dr. Tresa Moore reviewed the case. ADDENDUM: Immunohistochemistry for E-cadherin is positive supporting a ductal phenotype.    05/17/2020 Receptors her2   PROGNOSTIC INDICATORS Results: IMMUNOHISTOCHEMICAL AND MORPHOMETRIC ANALYSIS PERFORMED MANUALLY The tumor  cells are NEGATIVE for Her2 (1+). Estrogen Receptor: 90%, POSITIVE, STRONG STAINING INTENSITY Progesterone Receptor: 60%, POSITIVE, STRONG STAINING INTENSITY Proliferation Marker Ki67: 70%   05/31/2020 Initial Diagnosis   Malignant neoplasm of upper-outer quadrant of left breast, estrogen receptor positive ([place])   05/31/2020 Cancer Staging   Staging form: Breast, AJCC 8th Edition - Clinical stage from 05/31/2020: Stage IIA (cT2, cN0, cM0, G3, ER+, PR+, HER2-) - Signed by Truitt Merle, MD on 06/01/2020 Stage prefix: Initial diagnosis   06/03/2020 Cancer Staging   Staging form: Breast, AJCC 8th Edition - Pathologic stage from 06/03/2020: Stage IIA (pT2, pN1a, cM0, G3, ER+, PR+, HER2-) - Signed by Truitt Merle, MD on 06/29/2020 Stage prefix: Initial diagnosis Multigene prognostic tests performed: MammaPrint Histologic grading system: 3 grade system   06/03/2020 Surgery   Left breast lumpectomy with left axillary sentinel lymph node  biopsies  A. 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  - High grade ductal carcinoma in situ.  - Invasive carcinoma is at the inferior margin focally and <0.1 cm from  the anterior margin focally.  - In situ carcinoma is <0.1 cm from the anterior margin focally.  - Biopsy site.  - See oncology table.   B. 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   C. LYMPH NODE, LEFT AXILLARY, #2, SENTINEL, EXCISION:  - One of one lymph nodes negative for carcinoma (0/1).      CURRENT THERAPY:  To start TC late 07/2020  INTERVAL HISTORY:  Sherry Carrillo is here for a follow up of breast cancer. She was last seen by me on 06/01/20 in consultation. She presents to the clinic alone. She states her  surgery went well. She reports a severe headache on the Monday after her lumpectomy that lasted through Tuesday. She reports numbness under her arm that is improving. She notes after  surgery, it was difficult to even touch due to tingling. She had discussed the pathology results with Dr. Barry Dienes on 06/27/20. She notes she is trying to handle her appointment today on her own. She would like to bring her husband and daughter with her to chemo education.  REVIEW OF SYSTEMS:   Constitutional: Denies fevers, chills or abnormal weight loss Eyes: Denies blurriness of vision Ears, nose, mouth, throat, and face: Denies mucositis or sore throat Respiratory: Denies cough, dyspnea or wheezes Cardiovascular: Denies palpitation, chest discomfort or lower extremity swelling Gastrointestinal:  Denies nausea, heartburn or change in bowel habits Skin: Denies abnormal skin rashes Lymphatics: Denies new lymphadenopathy or easy bruising Neurological: Denies tingling or new weaknesses, (+) numbness to left axilla Behavioral/Psych: Mood is stable, no new changes, (+) anxious (regarding diagnosis) All other systems were reviewed with the patient and are negative.  MEDICAL HISTORY:  Past Medical History:  Diagnosis Date  . Abnormal pap 2009   PT HAD SURGERY BUT DON'T KNOW WHAT TYPE  . Breast cancer ([place]) 04/27/2020  . Headache(784.0)    otc meds prn  . Hypertension   . Hyperthyroidism   . SVD (spontaneous vaginal delivery)    x 2    SURGICAL HISTORY: Past Surgical History:  Procedure Laterality Date  . BREAST LUMPECTOMY WITH RADIOACTIVE SEED AND SENTINEL LYMPH NODE BIOPSY Left 06/03/2020   Procedure: RADIOACTIVE SEED GUIDED LEFT BREAST LUMPECTOMY, LEFT AXILLARY SENTINEL LYMPH NODE BIOPSY;  Surgeon: [name], MD;  Location: [facility];  Service: General;  Laterality: Left;  . FOOT SURGERY     left  . TUBAL LIGATION    . WISDOM TOOTH EXTRACTION      I have reviewed the social history and family history with the patient and they are unchanged from previous note.  ALLERGIES:  is allergic to sulfa antibiotics.  MEDICATIONS:  Current Outpatient Medications  Medication Sig Dispense  Refill  . Cholecalciferol (VITAMIN D3) 1.25 MG (50000 UT) CAPS Take 50,000 Units by mouth every 7 (seven) days.    Marland Kitchen lisinopril-hydrochlorothiazide (ZESTORETIC) 20-12.5 MG tablet Take 1 tablet by mouth once daily (Patient taking differently: Take 1 tablet by mouth daily.) 90 tablet 0  . oxyCODONE (OXY IR/ROXICODONE) 5 MG immediate release tablet Take 1 tablet (5 mg total) by mouth every 4 (four) hours as needed for severe pain. 15 tablet 0  . venlafaxine (EFFEXOR) 37.5 MG tablet Take 1 tablet (37.5 mg total) by mouth 2 (two) times daily. 30 tablet 0   No current facility-administered medications for this visit.    PHYSICAL EXAMINATION: ECOG PERFORMANCE STATUS: 0 - Asymptomatic  Vitals:   06/30/20 0916  BP: 120/81  Pulse: 78  Resp: 19  Temp: 97.8 F (36.6 C)  SpO2: 100%   Filed Weights   06/30/20 0916  Weight: 164 lb 1.6 oz (74.4 kg)    Due to COVID19 we will limit examination to appearance. Patient had no complaints.  GENERAL:alert, no distress and comfortable SKIN: skin color normal, no rashes or significant lesions EYES: normal, Conjunctiva are pink and non-injected, sclera clear  NEURO: alert & oriented x 3 with fluent speech   LABORATORY DATA:  I have reviewed the data as listed CBC Latest Ref Rng & Units 06/01/2020 05/09/2020 05/15/2019  WBC 4.0 - 10.5 K/uL 8.4 8.0 5.0  Hemoglobin 12.0 -  15.0 g/dL 13.3 14.0 14.0  Hematocrit 36.0 - 46.0 % 42.0 43 44.0  Platelets 150 - 400 K/uL 258 148(A) 263     CMP Latest Ref Rng & Units 06/01/2020 05/09/2020 05/15/2019  Glucose 70 - 99 mg/dL 96 - 94  BUN 6 - 20 mg/dL _0 Creatinine 0.44 - 1.00 mg/dL 0.71 0.8 0.55  Sodium 135 - 145 mmol/L 138 138 141  Potassium 3.5 - 5.1 mmol/L 4.1 4.2 4.6  Chloride 98 - 111 mmol/L 102 98(A) 104  CO2 22 - 32 mmol/L 26 25(A) 27  Calcium 8.9 - 10.3 mg/dL 9.6 9.8 9.0  Total Protein 6.5 - 8.1 g/dL 7.2 - -  Total Bilirubin 0.3 - 1.2 mg/dL 0.4 - -  Alkaline Phos 38 - 126 U/L 67 87 -  AST 15 - 41  U/L 15 15 -  ALT 0 - 44 U/L 14 18 -      RADIOGRAPHIC STUDIES: I have personally reviewed the radiological images as listed and agreed with the findings in the report. No results found.   ASSESSMENT & PLAN:  CALLIOPE DELANGEL is a 55 y.o. female with a history of HTN, Hyperthyroidism  1. Malignant neoplasm of upper-outer quadrant of left breast, Stage IIA, pT2N1M0, stage IIA, ER+/PR+/HER2-, Grade III, Mammaprint high risk  -she underwent Left breast lumpectomy with left axillary sentinel lymph node biopsies on 06/03/20 under Dr. Barry Dienes. Pathology showed: 2.4 cm invasive ductal carcinoma, grade 3; DCIS; one positive lymph node (1/2); positive inferior margin. We reviewed her pathology extensively today. -Given the positive node, mammaprint was obtained on the surgical sample. Results showed high risk luminal type. I discussed the predict risk of recurrence and benefit of adjuvant chemotherapy at length today. -I recommend a CT chest, abdomen and pelvis with contrast, and whole-body bone scan for staging, to ruled out distant metastasis. -I recommend adjuvant chemotherapy to reduce her risk of distant recurrence. I reviewed the two different types of chemotherapy regimen AC-T and TC.  Patient is very concerned about the prolonged extensive therapy and the potential side effect from AC-T regimen. Given her ER+/HER2- disease, there is very little difference in the outcomes of the two regimen in her case, I feel comfortable treating her with 4 cycles of docetaxel and Cytoxan (TC).   --Chemotherapy consent: Side effects including but not limited to, fatigue, nausea, vomiting, diarrhea, hair loss (small risk of permanent hair loss), neuropathy, fluid retention, renal and kidney dysfunction, neutropenic fever, needed for blood transfusion, bleeding, were discussed with patient in great detail. She agrees to proceed. -I discussed the role of DigniCap to prevent hair loss, she is interested, I gave her written  material. --The goal of therapy is curative -Given the positive surgical margin, she needs reexcision surgery, this is scheduled for 07/27/20 (due to her upcoming vacation). I discussed why this is necessary today. -She is scheduled for vacation next week and to take a trip to [place] in 11/2020. I assured her we will be to work around these. -I will see her back 2 weeks after surgery and plan to start chemo in 3-4 weeks after her second surgery  -I recommend port placement, she agrees.  Dr. Barry Dienes will put in during her re-excision surgery    2. Hot flashes, Hormonal Replacement  -She was seen to be post-menopausal on 12/2017 labs with [facility] -She started having hot flashes 7-8 months ago. She tried SSRI but did not take long enough to see effects.  -  Her gyn started on her Estradial patch in 04/2020 which helps very well.  -Given her ER/PR positive disease, she has stopped Estradaol   -I prescribed SSRI with Effexor to help her hot flashes on 06/01/20. She is tolerating well  -I recommend obtaining baseline DEXA in 2022 given she is post-menopausal.    3. Abnormal Pap Smear 04/2020 -Pt notes she was recently told of abnormal Pap smear.  -Endometrium biopsy 06/07/20 was benign   4. Social Support -She is married with 2 adult daughters. Her family is aware of her breast cancer diagnosis.  -She was overwhelmed with diagnosis and chemo. She felt better following our discussion today.   5. Genetic Testing  -She has mild family history of breast cancer. I previously discussed if she is interested in genetic testing I can refer her.    PLAN:  -Proceed with re-excision for positive margin on 5/4 with port placement  -CT chest, abdomen pelvis with contrast and bone scan for staging -schedule chemo education class -f/u 2 weeks after surgery (~08/11/20) -labs, flush and chemo TC the following week (~08/18/20)   No problem-specific Assessment & Plan notes found for this  encounter.   Orders Placed This Encounter  Procedures  . CT CHEST ABDOMEN PELVIS W CONTRAST    Standing Status:   Future    Standing Expiration Date:   06/30/2021    Order Specific Question:   If indicated for the ordered procedure, I authorize the administration of contrast media per Radiology protocol    Answer:   Yes    Order Specific Question:   Is patient pregnant?    Answer:   No    Order Specific Question:   Preferred imaging location?    Answer:   [facility]    Order Specific Question:   Release to patient    Answer:   Immediate    Order Specific Question:   Is Oral Contrast requested for this exam?    Answer:   Yes, Per Radiology protocol    Order Specific Question:   Reason for Exam (SYMPTOM  OR DIAGNOSIS REQUIRED)    Answer:   staging  . NM Bone Scan Whole Body    Standing Status:   Future    Standing Expiration Date:   06/30/2021    Order Specific Question:   If indicated for the ordered procedure, I authorize the administration of a radiopharmaceutical per Radiology protocol    Answer:   Yes    Order Specific Question:   Is the patient pregnant?    Answer:   No    Order Specific Question:   Preferred imaging location?    Answer:   [facility]   All questions were answered. The patient knows to call the clinic with any problems, questions or concerns. No barriers to learning was detected. The total time spent in the appointment was 40 minutes.     Truitt Merle, MD 06/30/2020   I, Wilburn Mylar, am acting as scribe for Truitt Merle, MD.   I have reviewed the above documentation for accuracy and completeness, and I agree with the above.

## 2020-06-30 NOTE — Progress Notes (Signed)
START ON PATHWAY REGIMEN - Breast     A cycle is every 21 days:     Docetaxel      Cyclophosphamide   **Always confirm dose/schedule in your pharmacy ordering system**  Patient Characteristics: Postoperative without Neoadjuvant Therapy (Pathologic Staging), Invasive Disease, Adjuvant Therapy, HER2 Negative/Unknown/Equivocal, ER Positive, Node Positive, Node Positive (1-3), MammaPrint(R) Ordered, High Genomic Risk Therapeutic Status: Postoperative without Neoadjuvant Therapy (Pathologic Staging) AJCC Grade: G3 AJCC N Category: pN1a AJCC M Category: cM0 ER Status: Positive (+) AJCC 8 Stage Grouping: IIA HER2 Status: Negative (-) Oncotype Dx Recurrence Score: Ordered Other Genomic Test AJCC T Category: pT2 PR Status: Positive (+) Adjuvant Therapy Status: No Adjuvant Therapy Received Yet or Changing Initial Adjuvant Regimen due to Tolerance Has this patient completed genomic testing<= Yes - MammaPrint(R) MammaPrint(R) Score: High Genomic Risk Intent of Therapy: Curative Intent, Discussed with Patient

## 2020-07-04 ENCOUNTER — Other Ambulatory Visit: Payer: Self-pay | Admitting: Family Medicine

## 2020-07-04 ENCOUNTER — Encounter: Payer: Self-pay | Admitting: Hematology

## 2020-07-04 ENCOUNTER — Ambulatory Visit: Payer: BC Managed Care – PPO | Admitting: Hematology

## 2020-07-04 ENCOUNTER — Encounter: Payer: Self-pay | Admitting: *Deleted

## 2020-07-04 DIAGNOSIS — I10 Essential (primary) hypertension: Secondary | ICD-10-CM

## 2020-07-05 ENCOUNTER — Telehealth: Payer: Self-pay | Admitting: Hematology

## 2020-07-05 ENCOUNTER — Encounter: Payer: Self-pay | Admitting: Hematology

## 2020-07-05 NOTE — Telephone Encounter (Signed)
Scheduled follow-up appointments per 4/7 los. Patient is aware. 

## 2020-07-11 ENCOUNTER — Telehealth: Payer: Self-pay | Admitting: *Deleted

## 2020-07-11 ENCOUNTER — Encounter: Payer: Self-pay | Admitting: *Deleted

## 2020-07-11 NOTE — Telephone Encounter (Signed)
Called patient to review the need for CT/bone scan.  Patient states she is out of the [place] and asks for me to call her back Monday 4/25.  Informed her that I will call her back.

## 2020-07-18 ENCOUNTER — Encounter: Payer: Self-pay | Admitting: *Deleted

## 2020-07-18 ENCOUNTER — Other Ambulatory Visit: Payer: Self-pay | Admitting: *Deleted

## 2020-07-18 ENCOUNTER — Telehealth: Payer: Self-pay | Admitting: *Deleted

## 2020-07-18 NOTE — Telephone Encounter (Signed)
Spoke with patient to review instructions for her CT/bone scan on 4/27.  Patient verbalized understanding. Discussed next steps for dignicap as well.

## 2020-07-19 ENCOUNTER — Ambulatory Visit: Payer: BC Managed Care – PPO | Admitting: Endocrinology

## 2020-07-20 ENCOUNTER — Encounter (HOSPITAL_COMMUNITY): Payer: Self-pay

## 2020-07-20 ENCOUNTER — Other Ambulatory Visit: Payer: Self-pay

## 2020-07-20 ENCOUNTER — Ambulatory Visit (HOSPITAL_COMMUNITY)
Admission: RE | Admit: 2020-07-20 | Discharge: 2020-07-20 | Disposition: A | Discharge status: Home / Self Care | Payer: BC Managed Care – PPO | Source: Ambulatory Visit | Attending: Hematology | Admitting: Hematology

## 2020-07-20 ENCOUNTER — Encounter (HOSPITAL_COMMUNITY)
Admission: RE | Admit: 2020-07-20 | Discharge: 2020-07-20 | Disposition: A | Discharge status: Home / Self Care | Payer: BC Managed Care – PPO | Source: Ambulatory Visit | Attending: Hematology | Admitting: Hematology

## 2020-07-20 DIAGNOSIS — C50412 Malignant neoplasm of upper-outer quadrant of left female breast: Secondary | ICD-10-CM | POA: Insufficient documentation

## 2020-07-20 DIAGNOSIS — Z170 Estrogen receptor positive status [ER+]: Secondary | ICD-10-CM | POA: Insufficient documentation

## 2020-07-20 LAB — POCT I-STAT CREATININE: Creatinine, Ser: 0.7 mg/dL (ref 0.44–1.00)

## 2020-07-20 MED ORDER — IOHEXOL 9 MG/ML PO SOLN
ORAL | Status: AC
  Filled 2020-07-20: qty 500

## 2020-07-20 MED ORDER — IOHEXOL 300 MG/ML  SOLN
100.0000 mL | Freq: Once | INTRAMUSCULAR | Status: AC | PRN
  Administered 2020-07-20: 100 mL via INTRAVENOUS

## 2020-07-20 MED ORDER — IOHEXOL 9 MG/ML PO SOLN
500.0000 mL | ORAL | Status: AC
  Administered 2020-07-20: 500 mL via ORAL

## 2020-07-20 MED ORDER — HEPARIN SOD (PORK) LOCK FLUSH 100 UNIT/ML IV SOLN
INTRAVENOUS | Status: AC
  Filled 2020-07-20: qty 5

## 2020-07-20 MED ORDER — TECHNETIUM TC 99M MEDRONATE IV KIT
20.0000 | PACK | Freq: Once | INTRAVENOUS | Status: AC | PRN
  Administered 2020-07-20: 20 via INTRAVENOUS

## 2020-07-21 ENCOUNTER — Telehealth: Payer: Self-pay | Admitting: Hematology

## 2020-07-21 NOTE — Telephone Encounter (Signed)
I called patient and reviewed her CT and bone scan findings.  No evidence of residual or metastatic disease.  I discussed the incidental findings with her.  She was previously referred to endocrinology about her thyroid issue, but canceled the appointment due to her cancer diagnosis.  I encouraged her to call back and we reschedule with endocrinology clinic, and time in the next 3 months.  They will probably do thyroid ultrasound for her thyroid nodule which showed on CT scan.  Patient had a question about mineral supplement.  One of her friends recommended calcium, magnesium, copper and iodine supplement, I recommend taking magnesium and calcium only, along with vitamin D.  Patient has a few questions about her surgical preparation next week, I sent a message to Dr. Barry Dienes.  Truitt Merle  07/21/2020

## 2020-07-22 ENCOUNTER — Encounter: Payer: Self-pay | Admitting: *Deleted

## 2020-07-25 ENCOUNTER — Other Ambulatory Visit (HOSPITAL_COMMUNITY)
Admission: RE | Admit: 2020-07-25 | Discharge: 2020-07-25 | Disposition: A | Discharge status: Home / Self Care | Payer: BC Managed Care – PPO | Source: Ambulatory Visit | Attending: General Surgery | Admitting: General Surgery

## 2020-07-25 DIAGNOSIS — Z01812 Encounter for preprocedural laboratory examination: Secondary | ICD-10-CM | POA: Insufficient documentation

## 2020-07-25 DIAGNOSIS — Z882 Allergy status to sulfonamides status: Secondary | ICD-10-CM | POA: Diagnosis not present

## 2020-07-25 DIAGNOSIS — Z79899 Other long term (current) drug therapy: Secondary | ICD-10-CM | POA: Diagnosis not present

## 2020-07-25 DIAGNOSIS — Z803 Family history of malignant neoplasm of breast: Secondary | ICD-10-CM | POA: Diagnosis not present

## 2020-07-25 DIAGNOSIS — Z170 Estrogen receptor positive status [ER+]: Secondary | ICD-10-CM | POA: Diagnosis not present

## 2020-07-25 DIAGNOSIS — C773 Secondary and unspecified malignant neoplasm of axilla and upper limb lymph nodes: Secondary | ICD-10-CM | POA: Diagnosis not present

## 2020-07-25 DIAGNOSIS — C50412 Malignant neoplasm of upper-outer quadrant of left female breast: Secondary | ICD-10-CM | POA: Diagnosis present

## 2020-07-25 DIAGNOSIS — Z20822 Contact with and (suspected) exposure to covid-19: Secondary | ICD-10-CM | POA: Insufficient documentation

## 2020-07-26 ENCOUNTER — Other Ambulatory Visit: Payer: Self-pay

## 2020-07-26 ENCOUNTER — Encounter (HOSPITAL_COMMUNITY): Payer: Self-pay | Admitting: General Surgery

## 2020-07-26 LAB — SARS CORONAVIRUS 2 (TAT 6-24 HRS): SARS Coronavirus 2: NEGATIVE

## 2020-07-26 NOTE — Progress Notes (Signed)
Mrs Buser denies chest pain or shortness of breath. Patient was tested for Covid and has been in quarantine since that time.  Mrs. Defreitas states she did go to her place of employment and did speak to a customer, patient was wearing a mask.

## 2020-07-26 NOTE — Anesthesia Preprocedure Evaluation (Addendum)
Anesthesia Evaluation  Patient identified by MRN, date of birth, ID band Patient awake    Reviewed: Allergy & Precautions, NPO status   Airway Mallampati: II  TM Distance: >3 FB     Dental   Pulmonary    breath sounds clear to auscultation       Cardiovascular hypertension,  Rhythm:Regular Rate:Normal     Neuro/Psych    GI/Hepatic Neg liver ROS, GERD  ,  Endo/Other  Hyperthyroidism   Renal/GU negative Renal ROS     Musculoskeletal   Abdominal   Peds  Hematology   Anesthesia Other Findings   Reproductive/Obstetrics                            Anesthesia Physical Anesthesia Plan  ASA: III  Anesthesia Plan: General   Post-op Pain Management:    Induction: Intravenous  PONV Risk Score and Plan: 3 and Ondansetron, Dexamethasone and Midazolam  Airway Management Planned: LMA  Additional Equipment:   Intra-op Plan:   Post-operative Plan: Extubation in OR  Informed Consent: I have reviewed the patients History and Physical, chart, labs and discussed the procedure including the risks, benefits and alternatives for the proposed anesthesia with the patient or authorized representative who has indicated his/her understanding and acceptance.     Dental advisory given  Plan Discussed with: Anesthesiologist and CRNA  Anesthesia Plan Comments:        Anesthesia Quick Evaluation

## 2020-07-26 NOTE — H&P (Signed)
Christophe Louis Location: [facility] Patient #: [id number] DOB: 1965/05/02 Married / Language: English / Race: [place] or [place] Native Female   History of Present Illness The patient is a 55 year old female who presents for a Follow-up for Breast cancer.Pt is a 55 yo F who was referred by Dr. Rosana Hoes for a new diagnosis of left breast cancer. She had screening detected mass with distortion. Diagnostic imaging showed a 2.3 cm mass at 1 o'clock on the left. 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 She is sore from the bruising. She had a maternal aunt who had breast cancer. Her mother had lung cancer that went to her liver. She hasn't had a diagnosis of cancer before this. She is a G2P2 wtih menarche at age 25 and is perimenopausal now.    She underwent left breast seed localized lumpectomy and sentinel lymph node biopsy on 06/03/20. She came to urgent office for seroma and had 120 mL aspirated. Her pathology showed a positive margin and 1/3 nodes positive. She had mammaprint sent off, but they requested additional tissue, so this is not back yet. She is quite upset about the positive node, not unexpectedly.   Her pain and swelling has significantly improved.    Pathology 06/03/20 A. BREAST, LEFT, LUMPECTOMY: - Invasive ductal carcinoma, grade 3, spanning 2.4 cm. - High grade ductal carcinoma in situ. - Invasive carcinoma is at the inferior margin focally and <0.1 cm from the anterior margin focally. - In situ carcinoma is <0.1 cm from the anterior margin focally. - Biopsy site. - See oncology table. B. 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 C. LYMPH NODE, LEFT AXILLARY, #2,  SENTINEL, EXCISION: - One of one lymph nodes negative for carcinoma (0/1).  AMENDMENT NOTE: DIAGNOSTIC INFORMATION HAS NOT BEEN CHANGED. The original report indicated an incorrect parenthetical number for involved lymph nodes in part B. The final report should read (1/1) INSTEAD OF (0/1). This has been corrected.   dx mammogram/us 05/17/2020 EXAM: DIGITAL DIAGNOSTIC BILATERAL MAMMOGRAM WITH TOMOSYNTHESIS AND CAD; ULTRASOUND LEFT BREAST LIMITED  TECHNIQUE: Bilateral digital diagnostic mammography and breast tomosynthesis was performed. The images were evaluated with computer-aided detection.; Targeted ultrasound examination of the left breast was performed  COMPARISON: Previous exam(s).  ACR Breast Density Category b: There are scattered areas of fibroglandular density.  FINDINGS: RIGHT: No findings suspicious for malignancy.  LEFT: Irregular hyperdense mass associated with architectural distortion involving the UPPER OUTER QUADRANT at POSTERIOR depth, measuring approximately 2-2.5 cm. There is no associated calcifications. No suspicious findings elsewhere.  Targeted ultrasound is performed, showing an irregular hypoechoic mass with a hyperechoic halo at the 1 o'clock position approximately 8 cm from the nipple, measuring approximately 2.3 x 1.6 x 2.0 cm, demonstrating slight posterior acoustic shadowing and demonstrating internal power Doppler flow.  Sonographic evaluation of the LEFT axilla demonstrates no pathologic lymphadenopathy.  On correlative physical exam, there is an approximate 2-3 cm firm palpable lump in the UPPER OUTER QUADRANT of the LEFT breast.  IMPRESSION: 1. Highly suspicious 2.3 cm mass involving the UPPER OUTER QUADRANT of the LEFT breast at the 1 o'clock position approximately 8 cm from the nipple. 2. No pathologic LEFT axillary lymphadenopathy. 3. No mammographic evidence of malignancy involving the  RIGHT breast.  RECOMMENDATION: Ultrasound-guided core needle biopsy of the LEFT breast mass.  The ultrasound biopsy procedure was discussed with the  patient and her questions were answered. She wishes to proceed and the biopsy has been scheduled for later today at 1:30 p.m.  I have discussed the findings and recommendations with the patient.  BI-RADS CATEGORY 5: Highly suggestive of malignancy.   pathology 05/17/2020 Breast, left, needle core biopsy, 1 o'clock - INVASIVE MAMMARY CARCINOMA The tumor cells are NEGATIVE for Her2 (1+). Estrogen Receptor: 90%, POSITIVE, STRONG STAINING INTENSITY Progesterone Receptor: 60%, POSITIVE, STRONG STAINING INTENSITY Proliferation Marker Ki67: 70%    Allergies Sulfa Drugs  Allergies Reconciled   Medication History Lisinopril-hydroCHLOROthiazide (20-12.5MG Tablet, Oral) Active. Medications Reconciled    Review of Systems  All other systems negative   Physical Exam  Breast Note: Single axillary incision. No significant fluid present. no erythema.     Assessment & Plan MALIGNANT NEOPLASM OF UPPER-OUTER QUADRANT OF LEFT BREAST IN FEMALE, ESTROGEN RECEPTOR POSITIVE (C50.412) Impression: Will plan reexcision of the positive margin. The anterior margin is negative but close (skin) but inferior margin is positive.  Will excise the inferior margin and if needed, will place port at the same time.  Significant time was spent with patient reviewing the timeline and what will happen when. I discussed that chemo is usually a short course if mammaprint is positive. Dr. Burr Medico will discuss this in detail if mammaprint is high risk. She would also like an opinion from [facility] just to make sure everyone is on the same page.  I will make referral. I have written orders for reexcision and possible port placement. Oncology and the patient will let me know about the port for sure late this week or next week.  If no chemo, patient would proceed to  radiation followed by antihormone tx. BREAST CANCER METASTASIZED TO AXILLARY LYMPH NODE, LEFT (C50.912) Current Plans You are being scheduled for surgery- Our schedulers will call you.  You should hear from our office's scheduling department within 5 working days about the location, date, and time of surgery. We try to make accommodations for patient's preferences in scheduling surgery, but sometimes the OR schedule or the surgeon's schedule prevents [place] from making those accommodations.  If you have not heard from our office [phone] in 5 working days, call the office and ask for your surgeon's nurse.  If you have other questions about your diagnosis, plan, or surgery, call the office and ask for your surgeon's nurse.    Signed electronically by [name], MD

## 2020-07-27 ENCOUNTER — Encounter (HOSPITAL_COMMUNITY): Payer: Self-pay | Admitting: General Surgery

## 2020-07-27 ENCOUNTER — Ambulatory Visit (HOSPITAL_COMMUNITY): Payer: BC Managed Care – PPO | Admitting: Anesthesiology

## 2020-07-27 ENCOUNTER — Ambulatory Visit (HOSPITAL_COMMUNITY)
Admission: RE | Admit: 2020-07-27 | Discharge: 2020-07-27 | Disposition: A | Discharge status: Home / Self Care | Payer: BC Managed Care – PPO | Attending: General Surgery | Admitting: General Surgery

## 2020-07-27 ENCOUNTER — Ambulatory Visit (HOSPITAL_COMMUNITY): Payer: BC Managed Care – PPO

## 2020-07-27 ENCOUNTER — Encounter (HOSPITAL_COMMUNITY)
Admission: RE | Disposition: A | Discharge status: Home / Self Care | Payer: Self-pay | Source: Home / Self Care | Attending: General Surgery

## 2020-07-27 DIAGNOSIS — Z882 Allergy status to sulfonamides status: Secondary | ICD-10-CM | POA: Insufficient documentation

## 2020-07-27 DIAGNOSIS — Z20822 Contact with and (suspected) exposure to covid-19: Secondary | ICD-10-CM | POA: Insufficient documentation

## 2020-07-27 DIAGNOSIS — Z419 Encounter for procedure for purposes other than remedying health state, unspecified: Secondary | ICD-10-CM

## 2020-07-27 DIAGNOSIS — Z79899 Other long term (current) drug therapy: Secondary | ICD-10-CM | POA: Insufficient documentation

## 2020-07-27 DIAGNOSIS — C50412 Malignant neoplasm of upper-outer quadrant of left female breast: Secondary | ICD-10-CM | POA: Insufficient documentation

## 2020-07-27 DIAGNOSIS — C773 Secondary and unspecified malignant neoplasm of axilla and upper limb lymph nodes: Secondary | ICD-10-CM | POA: Insufficient documentation

## 2020-07-27 DIAGNOSIS — Z170 Estrogen receptor positive status [ER+]: Secondary | ICD-10-CM | POA: Insufficient documentation

## 2020-07-27 DIAGNOSIS — Z803 Family history of malignant neoplasm of breast: Secondary | ICD-10-CM | POA: Insufficient documentation

## 2020-07-27 HISTORY — PX: PORTACATH PLACEMENT: SHX2246

## 2020-07-27 HISTORY — PX: RE-EXCISION OF BREAST LUMPECTOMY: SHX6048

## 2020-07-27 LAB — BASIC METABOLIC PANEL
Anion gap: 6 (ref 5–15)
BUN: 18 mg/dL (ref 6–20)
CO2: 28 mmol/L (ref 22–32)
Calcium: 9.1 mg/dL (ref 8.9–10.3)
Chloride: 105 mmol/L (ref 98–111)
Creatinine, Ser: 0.65 mg/dL (ref 0.44–1.00)
GFR, Estimated: >60 mL/min (ref >60)
Glucose, Bld: 110 mg/dL — ABNORMAL HIGH (ref 70–99)
Potassium: 4 mmol/L (ref 3.5–5.1)
Sodium: 139 mmol/L (ref 135–145)

## 2020-07-27 SURGERY — EXCISION, LESION, BREAST
Anesthesia: General | Site: Chest | Laterality: Left

## 2020-07-27 MED ORDER — LIDOCAINE HCL (PF) 1 % IJ SOLN
INTRAMUSCULAR | Status: AC
  Filled 2020-07-27: qty 30

## 2020-07-27 MED ORDER — GLYCOPYRROLATE PF 0.2 MG/ML IJ SOSY
PREFILLED_SYRINGE | INTRAMUSCULAR | Status: AC
  Filled 2020-07-27: qty 1

## 2020-07-27 MED ORDER — MIDAZOLAM HCL 2 MG/2ML IJ SOLN
INTRAMUSCULAR | Status: AC
  Filled 2020-07-27: qty 2

## 2020-07-27 MED ORDER — ONDANSETRON HCL 4 MG/2ML IJ SOLN
INTRAMUSCULAR | Status: DC | PRN
  Administered 2020-07-27: 4 mg via INTRAVENOUS

## 2020-07-27 MED ORDER — DEXAMETHASONE SODIUM PHOSPHATE 10 MG/ML IJ SOLN
INTRAMUSCULAR | Status: DC | PRN
  Administered 2020-07-27: 10 mg via INTRAVENOUS

## 2020-07-27 MED ORDER — CHLORHEXIDINE GLUCONATE 0.12 % MT SOLN
15.0000 mL | Freq: Once | OROMUCOSAL | Status: AC
  Administered 2020-07-27: 15 mL via OROMUCOSAL
  Filled 2020-07-27: qty 15

## 2020-07-27 MED ORDER — 0.9 % SODIUM CHLORIDE (POUR BTL) OPTIME
TOPICAL | Status: DC | PRN
  Administered 2020-07-27: 1000 mL

## 2020-07-27 MED ORDER — HEPARIN SOD (PORK) LOCK FLUSH 100 UNIT/ML IV SOLN
INTRAVENOUS | Status: DC | PRN
  Administered 2020-07-27: 500 [IU]

## 2020-07-27 MED ORDER — PHENYLEPHRINE HCL (PRESSORS) 10 MG/ML IV SOLN
INTRAVENOUS | Status: DC | PRN
  Administered 2020-07-27 (×5): 80 ug via INTRAVENOUS

## 2020-07-27 MED ORDER — PROPOFOL 10 MG/ML IV BOLUS
INTRAVENOUS | Status: AC
  Filled 2020-07-27: qty 40

## 2020-07-27 MED ORDER — PROPOFOL 10 MG/ML IV BOLUS
INTRAVENOUS | Status: DC | PRN
  Administered 2020-07-27: 200 mg via INTRAVENOUS

## 2020-07-27 MED ORDER — ONDANSETRON HCL 4 MG/2ML IJ SOLN
INTRAMUSCULAR | Status: AC
  Filled 2020-07-27: qty 2

## 2020-07-27 MED ORDER — PHENYLEPHRINE 40 MCG/ML (10ML) SYRINGE FOR IV PUSH (FOR BLOOD PRESSURE SUPPORT)
PREFILLED_SYRINGE | INTRAVENOUS | Status: AC
  Filled 2020-07-27: qty 10

## 2020-07-27 MED ORDER — LIDOCAINE 2% (20 MG/ML) 5 ML SYRINGE
INTRAMUSCULAR | Status: AC
  Filled 2020-07-27: qty 5

## 2020-07-27 MED ORDER — SODIUM CHLORIDE 0.9 % IV SOLN: INTRAVENOUS | Status: DC | PRN

## 2020-07-27 MED ORDER — CHLORHEXIDINE GLUCONATE CLOTH 2 % EX PADS: 6.0000 | MEDICATED_PAD | Freq: Once | CUTANEOUS | Status: DC

## 2020-07-27 MED ORDER — FENTANYL CITRATE (PF) 100 MCG/2ML IJ SOLN: 25.0000 ug | INTRAMUSCULAR | Status: DC | PRN

## 2020-07-27 MED ORDER — HEPARIN SOD (PORK) LOCK FLUSH 100 UNIT/ML IV SOLN
INTRAVENOUS | Status: AC
  Filled 2020-07-27: qty 5

## 2020-07-27 MED ORDER — LACTATED RINGERS IV SOLN: INTRAVENOUS | Status: DC

## 2020-07-27 MED ORDER — LIDOCAINE 2% (20 MG/ML) 5 ML SYRINGE
INTRAMUSCULAR | Status: DC | PRN
  Administered 2020-07-27: 80 mg via INTRAVENOUS

## 2020-07-27 MED ORDER — LIDOCAINE HCL 1 % IJ SOLN
INTRAMUSCULAR | Status: DC | PRN
  Administered 2020-07-27: 36 mL

## 2020-07-27 MED ORDER — METOCLOPRAMIDE HCL 5 MG/ML IJ SOLN
INTRAMUSCULAR | Status: DC | PRN
  Administered 2020-07-27: 10 mg via INTRAVENOUS

## 2020-07-27 MED ORDER — FENTANYL CITRATE (PF) 250 MCG/5ML IJ SOLN
INTRAMUSCULAR | Status: AC
  Filled 2020-07-27: qty 5

## 2020-07-27 MED ORDER — CEFAZOLIN SODIUM-DEXTROSE 2-4 GM/100ML-% IV SOLN
2.0000 g | INTRAVENOUS | Status: AC
  Administered 2020-07-27: 2 g via INTRAVENOUS
  Filled 2020-07-27: qty 100

## 2020-07-27 MED ORDER — MIDAZOLAM HCL 2 MG/2ML IJ SOLN
INTRAMUSCULAR | Status: DC | PRN
  Administered 2020-07-27 (×2): 1 mg via INTRAVENOUS

## 2020-07-27 MED ORDER — BUPIVACAINE-EPINEPHRINE (PF) 0.25% -1:200000 IJ SOLN
INTRAMUSCULAR | Status: AC
  Filled 2020-07-27: qty 30

## 2020-07-27 MED ORDER — METOCLOPRAMIDE HCL 5 MG/ML IJ SOLN
INTRAMUSCULAR | Status: AC
  Filled 2020-07-27: qty 2

## 2020-07-27 MED ORDER — ORAL CARE MOUTH RINSE: 15.0000 mL | Freq: Once | OROMUCOSAL | Status: AC

## 2020-07-27 MED ORDER — FENTANYL CITRATE (PF) 250 MCG/5ML IJ SOLN
INTRAMUSCULAR | Status: DC | PRN
  Administered 2020-07-27 (×5): 50 ug via INTRAVENOUS

## 2020-07-27 MED ORDER — DEXAMETHASONE SODIUM PHOSPHATE 10 MG/ML IJ SOLN
INTRAMUSCULAR | Status: AC
  Filled 2020-07-27: qty 1

## 2020-07-27 MED ORDER — ACETAMINOPHEN 500 MG PO TABS
1000.0000 mg | ORAL_TABLET | ORAL | Status: AC
  Administered 2020-07-27: 1000 mg via ORAL
  Filled 2020-07-27: qty 2

## 2020-07-27 SURGICAL SUPPLY — 61 items
ADH SKN CLS APL DERMABOND .7 (GAUZE/BANDAGES/DRESSINGS) ×4
APL PRP STRL LF DISP 70% ISPRP (MISCELLANEOUS) ×2
BAG DECANTER FOR FLEXI CONT (MISCELLANEOUS) ×3 IMPLANT
BINDER BREAST LRG (GAUZE/BANDAGES/DRESSINGS) IMPLANT
BINDER BREAST XLRG (GAUZE/BANDAGES/DRESSINGS) ×1 IMPLANT
CANISTER SUCT 3000ML PPV (MISCELLANEOUS) ×3 IMPLANT
CHLORAPREP W/TINT 26 (MISCELLANEOUS) ×3 IMPLANT
CLIP VESOCCLUDE LG 6/CT (CLIP) ×1 IMPLANT
CNTNR URN SCR LID CUP LEK RST (MISCELLANEOUS) ×2 IMPLANT
CONT SPEC 4OZ STRL OR WHT (MISCELLANEOUS) ×6
COVER SURGICAL LIGHT HANDLE (MISCELLANEOUS) ×3 IMPLANT
COVER TRANSDUCER ULTRASND GEL (DISPOSABLE) IMPLANT
COVER WAND RF STERILE (DRAPES) ×3 IMPLANT
DECANTER SPIKE VIAL GLASS SM (MISCELLANEOUS) ×6 IMPLANT
DERMABOND ADVANCED (GAUZE/BANDAGES/DRESSINGS) ×2
DERMABOND ADVANCED .7 DNX12 (GAUZE/BANDAGES/DRESSINGS) IMPLANT
DRAPE C-ARM 42X120 X-RAY (DRAPES) ×3 IMPLANT
DRAPE CHEST BREAST 15X10 FENES (DRAPES) ×3 IMPLANT
DRSG PAD ABDOMINAL 8X10 ST (GAUZE/BANDAGES/DRESSINGS) ×3 IMPLANT
ELECT COATED BLADE 2.86 ST (ELECTRODE) ×3 IMPLANT
ELECT REM PT RETURN 9FT ADLT (ELECTROSURGICAL) ×3
ELECTRODE REM PT RTRN 9FT ADLT (ELECTROSURGICAL) ×2 IMPLANT
GAUZE 4X4 16PLY RFD (DISPOSABLE) ×2 IMPLANT
GAUZE SPONGE 4X4 12PLY STRL (GAUZE/BANDAGES/DRESSINGS) ×3 IMPLANT
GLOVE BIO SURGEON STRL SZ 6 (GLOVE) ×3 IMPLANT
GLOVE SURG UNDER LTX SZ6.5 (GLOVE) ×3 IMPLANT
GOWN STRL REUS W/ TWL LRG LVL3 (GOWN DISPOSABLE) ×2 IMPLANT
GOWN STRL REUS W/TWL 2XL LVL3 (GOWN DISPOSABLE) ×3 IMPLANT
GOWN STRL REUS W/TWL LRG LVL3 (GOWN DISPOSABLE) ×3
ILLUMINATOR WAVEGUIDE N/F (MISCELLANEOUS) ×1 IMPLANT
KIT BASIN OR (CUSTOM PROCEDURE TRAY) ×3 IMPLANT
KIT MARKER MARGIN INK (KITS) IMPLANT
KIT TURNOVER KIT B (KITS) ×3 IMPLANT
LIGHT WAVEGUIDE WIDE FLAT (MISCELLANEOUS) IMPLANT
MESH VENTRALIGHT ST 8X10 (Mesh General) ×1 IMPLANT
NDL HYPO 25GX1X1/2 BEV (NEEDLE) ×2 IMPLANT
NEEDLE 22X1 1/2 (OR ONLY) (NEEDLE) ×3 IMPLANT
NEEDLE HYPO 25GX1X1/2 BEV (NEEDLE) ×3 IMPLANT
NS IRRIG 1000ML POUR BTL (IV SOLUTION) ×3 IMPLANT
PACK GENERAL/GYN (CUSTOM PROCEDURE TRAY) ×3 IMPLANT
PAD ARMBOARD 7.5X6 YLW CONV (MISCELLANEOUS) ×3 IMPLANT
PENCIL BUTTON HOLSTER BLD 10FT (ELECTRODE) ×3 IMPLANT
PENCIL SMOKE EVACUATOR (MISCELLANEOUS) ×3 IMPLANT
POSITIONER HEAD DONUT 9IN (MISCELLANEOUS) ×3 IMPLANT
STRIP CLOSURE SKIN 1/2X4 (GAUZE/BANDAGES/DRESSINGS) ×3 IMPLANT
SUT MNCRL AB 4-0 PS2 18 (SUTURE) ×3 IMPLANT
SUT MON AB 4-0 PC3 18 (SUTURE) ×3 IMPLANT
SUT PROLENE 2 0 SH DA (SUTURE) ×6 IMPLANT
SUT SILK 2 0 PERMA HAND 18 BK (SUTURE) IMPLANT
SUT VIC AB 2-0 SH 27 (SUTURE) ×3
SUT VIC AB 2-0 SH 27XBRD (SUTURE) IMPLANT
SUT VIC AB 3-0 SH 27 (SUTURE) ×3
SUT VIC AB 3-0 SH 27X BRD (SUTURE) ×2 IMPLANT
SYR 5ML LUER SLIP (SYRINGE) ×3 IMPLANT
SYR BULB IRRIG 60ML STRL (SYRINGE) ×1 IMPLANT
SYR CONTROL 10ML LL (SYRINGE) ×3 IMPLANT
TOWEL GREEN STERILE (TOWEL DISPOSABLE) ×3 IMPLANT
TOWEL GREEN STERILE FF (TOWEL DISPOSABLE) ×3 IMPLANT
TRAY LAPAROSCOPIC MC (CUSTOM PROCEDURE TRAY) ×3 IMPLANT
TUBE CONNECTING 12X1/4 (SUCTIONS) IMPLANT
YANKAUER SUCT BULB TIP NO VENT (SUCTIONS) IMPLANT

## 2020-07-27 NOTE — Anesthesia Postprocedure Evaluation (Signed)
Anesthesia Post Note  Patient: Sherry Carrillo  Procedure(s) Performed: RE-EXCISION OF LEFT BREAST LUMPECTOMY (Left Breast) INSERTION PORT-A-CATH (Left Chest)     Patient location during evaluation: PACU Anesthesia Type: General Level of consciousness: awake Pain management: pain level controlled Vital Signs Assessment: post-procedure vital signs reviewed and stable Respiratory status: spontaneous breathing Cardiovascular status: stable Postop Assessment: no apparent nausea or vomiting Anesthetic complications: no   No complications documented.  Last Vitals:  Vitals:   07/27/20 1150 07/27/20 1205  BP: 130/69 119/75  Pulse: 77 84  Resp: 17 18  Temp:    SpO2: 99% 100%    Last Pain:  Vitals:   07/27/20 1135  TempSrc:   PainSc: 0-No pain                 Carlen Rebuck

## 2020-07-27 NOTE — Op Note (Signed)
PREOPERATIVE DIAGNOSIS:  Left breast cancer, upper outer quadrant, cT2N1, +/+/-     POSTOPERATIVE DIAGNOSIS:  Same     PROCEDURE: left subclavian port placement, Bard ClearVue Power Port, MRI safe, 8-French, reexcision left breast lumpectomy     SURGEON:  [name], MD     ASSIST: Ewell Poe, RNFA   ANESTHESIA:  General   FINDINGS:  Good venous return, easy flush, and tip of the catheter and   SVC 21 cm.      SPECIMEN:  None.      ESTIMATED BLOOD LOSS:  Minimal.      COMPLICATIONS:  None known.      PROCEDURE:  Pt was identified in the holding area and taken to   the operating room, where patient was placed supine on the operating room   table.  General anesthesia was induced.  Patient's arms were tucked and the upper   chest and neck were prepped and draped in sterile fashion.  Time-out was   performed according to the surgical safety check list.  When all was   correct, we continued.   Local anesthetic was administered over this   area at the angle of the clavicle.  The vein was accessed with 1 pass(es) of the needle. There was good venous return and the wire passed easily with no ectopy.   Fluoroscopy was used to confirm that the wire was in the vena cava.      The patient was placed back level and the area for the pocket was anethetized   with local anesthetic.  A 3-cm transverse incision was made with a #15   blade.  Cautery was used to divide the subcutaneous tissues down to the   pectoralis muscle.  An [employer] retractor was used to elevate the skin   while a pocket was created on top of the pectoralis fascia.  The port   was placed into the pocket to confirm that it was of adequate size.  The   catheter was preattached to the port.  The port was then secured to the   pectoralis fascia with four 2-0 Prolene sutures.  These were clamped and   not tied down yet.    The catheter was tunneled through to the wire exit   site.  The catheter was placed along the wire  to determine what length it should be to be in the SVC.  The catheter was cut at 21 cm.  The tunneler sheath and dilator were passed over the wire and the dilator and wire were removed.  The catheter was advanced through the tunneler sheath and the tunneler sheath was pulled away.  Care was taken to keep the catheter in the tunneler sheath as this occurred. This was advanced and the tunneler sheath was removed.  There was good venous   return and easy flush of the catheter.  The Prolene sutures were tied   down to the pectoral fascia.  Fluoroscopy was used to re-confirm good position of the catheter.   The skin was reapproximated using 3-0  Vicryl interrupted deep dermal sutures followed by a running 4-0 Monocryl in a subcuticular fashion.  The port was flushed with concentrated heparin flush as well.   Local anesthetic was administered around the axillary incision and the prior lumpectomy cavity. The reexcision was performed by reopening the prior incision. Seroma was aspirated. The mastopexy sutures were removed. Additional margins were taken at the inferior and anterior borders of the partial mastectomy cavity. Dissection  was carried down to the pectoral fascia. The specimens were marked with the margin marker paint kit. Hemostasis was achieved with cautery. Additional clips were placed in the cavity as needed.  Several deep 2-0 vicryl sutures were taken. The wound was irrigated and closed with a 3-0 Vicryl deep dermal interrupted and a 4-0 Monocryl subcuticular closure in layers.    The wounds were then cleaned, dried, and dressed with Dermabond.  The patient was awakened from anesthesia and taken to the PACU in stable condition.  Needle, sponge, and instrument counts were correct.               [name], MD

## 2020-07-27 NOTE — Transfer of Care (Signed)
Immediate Anesthesia Transfer of Care Note  Patient: Sherry Carrillo  Procedure(s) Performed: RE-EXCISION OF LEFT BREAST LUMPECTOMY (Left Breast) INSERTION PORT-A-CATH (Left Chest)  Patient Location: PACU  Anesthesia Type:General  Level of Consciousness: awake, alert  and oriented  Airway & Oxygen Therapy: Patient Spontanous Breathing and Patient connected to nasal cannula oxygen  Post-op Assessment: Report given to RN and Post -op Vital signs reviewed and stable  Post vital signs: Reviewed and stable  Last Vitals:  Vitals Value Taken Time  BP    Temp    Pulse    Resp    SpO2      Last Pain:  Vitals:   07/27/20 0726  TempSrc: Oral  PainSc: 4       Patients Stated Pain Goal: 2 (83/09/40 7680)  Complications: No complications documented.

## 2020-07-27 NOTE — Anesthesia Procedure Notes (Signed)
Procedure Name: LMA Insertion Date/Time: 07/27/2020 9:39 AM Performed by: Flossie Dibble, CRNA Pre-anesthesia Checklist: Patient identified, Patient being monitored, Emergency Drugs available, Timeout performed and Suction available Patient Re-evaluated:Patient Re-evaluated prior to induction Oxygen Delivery Method: Circle System Utilized Preoxygenation: Pre-oxygenation with 100% oxygen Induction Type: IV induction LMA: LMA inserted LMA Size: 4.0 Number of attempts: 2 Placement Confirmation: positive ETCO2 and breath sounds checked- equal and bilateral Dental Injury: Teeth and Oropharynx as per pre-operative assessment

## 2020-07-27 NOTE — Discharge Instructions (Addendum)
General Anesthesia, Adult, Care After This sheet gives you information about how to care for yourself after your procedure. Your health care provider may also give you more specific instructions. If you have problems or questions, contact your health care provider. What can I expect after the procedure? After the procedure, the following side effects are common:  Pain or discomfort at the IV site.  Nausea.  Vomiting.  Sore throat.  Trouble concentrating.  Feeling cold or chills.  Feeling weak or tired.  Sleepiness and fatigue.  Soreness and body aches. These side effects can affect parts of the body that were not involved in surgery. Follow these instructions at home: For the time period you were told by your health care provider:  Rest.  Do not participate in activities where you could fall or become injured.  Do not drive or use machinery.  Do not drink alcohol.  Do not take sleeping pills or medicines that cause drowsiness.  Do not make important decisions or sign legal documents.  Do not take care of children on your own.   Eating and drinking  Follow any instructions from your health care provider about eating or drinking restrictions.  When you feel hungry, start by eating small amounts of foods that are soft and easy to digest (bland), such as toast. Gradually return to your regular diet.  Drink enough fluid to keep your urine pale yellow.  If you vomit, rehydrate by drinking water, juice, or clear broth. General instructions  If you have sleep apnea, surgery and certain medicines can increase your risk for breathing problems. Follow instructions from your health care provider about wearing your sleep device: ? Anytime you are sleeping, including during daytime naps. ? While taking prescription pain medicines, sleeping medicines, or medicines that make you drowsy.  Have a responsible adult stay with you for the time you are told. It is important to have  someone help care for you until you are awake and alert.  Return to your normal activities as told by your health care provider. Ask your health care provider what activities are safe for you.  Take over-the-counter and prescription medicines only as told by your health care provider.  If you smoke, do not smoke without supervision.  Keep all follow-up visits as told by your health care provider. This is important. Contact a health care provider if:  You have nausea or vomiting that does not get better with medicine.  You cannot eat or drink without vomiting.  You have pain that does not get better with medicine.  You are unable to pass urine.  You develop a skin rash.  You have a fever.  You have redness around your IV site that gets worse. Get help right away if:  You have difficulty breathing.  You have chest pain.  You have blood in your urine or stool, or you vomit blood. Summary  After the procedure, it is common to have a sore throat or nausea. It is also common to feel tired.  Have a responsible adult stay with you for the time you are told. It is important to have someone help care for you until you are awake and alert.  When you feel hungry, start by eating small amounts of foods that are soft and easy to digest (bland), such as toast. Gradually return to your regular diet.  Drink enough fluid to keep your urine pale yellow.  Return to your normal activities as told by your health care provider.   Ask your health care provider what activities are safe for you. This information is not intended to replace advice given to you by your health care provider. Make sure you discuss any questions you have with your health care provider. Document Revised: 11/26/2019 Document Reviewed: 06/25/2019 Elsevier Patient Education  2021 [employer] [place] Office Phone Number [phone]  BREAST BIOPSY/ PARTIAL MASTECTOMY: POST OP INSTRUCTIONS  Always  review your discharge instruction sheet given to you by the facility where your surgery was performed.  IF YOU HAVE DISABILITY OR FAMILY LEAVE FORMS, YOU MUST BRING THEM TO THE OFFICE FOR PROCESSING.  DO NOT GIVE THEM TO YOUR DOCTOR.  1. A prescription for pain medication may be given to you upon discharge.  Take your pain medication as prescribed, if needed.  If narcotic pain medicine is not needed, then you may take acetaminophen (Tylenol) or ibuprofen (Advil) as needed. 2. Take your usually prescribed medications unless otherwise directed 3. If you need a refill on your pain medication, please contact your pharmacy.  They will contact our office to request authorization.  Prescriptions will not be filled after 5pm or on week-ends. 4. You should eat very light the first 24 hours after surgery, such as soup, crackers, pudding, etc.  Resume your normal diet the day after surgery. 5. Most patients will experience some swelling and bruising in the breast.  Ice packs and a good support bra will help.  Swelling and bruising can take several days to resolve.  6. It is common to experience some constipation if taking pain medication after surgery.  Increasing fluid intake and taking a stool softener will usually help or prevent this problem from occurring.  A mild laxative (Milk of Magnesia or Miralax) should be taken according to package directions if there are no bowel movements after 48 hours. 7. Unless discharge instructions indicate otherwise, you may remove your bandages 48 hours after surgery, and you may shower at that time.  You may have steri-strips (small skin tapes) in place directly over the incision.  These strips should be left on the skin for 7-10 days.   Any sutures or staples will be removed at the office during your follow-up visit. 8. ACTIVITIES:  You may resume regular daily activities (gradually increasing) beginning the next day.  Wearing a good support bra or sports bra (or the breast  binder) minimizes pain and swelling.  You may have sexual intercourse when it is comfortable. a. You may drive when you no longer are taking prescription pain medication, you can comfortably wear a seatbelt, and you can safely maneuver your car and apply brakes. b. RETURN TO WORK:  __________1 week_______________ 9. You should see your doctor in the office for a follow-up appointment approximately two weeks after your surgery.  Your doctor's nurse will typically make your follow-up appointment when she calls you with your pathology report.  Expect your pathology report 2-3 business days after your surgery.  You may call to check if you do not hear from [place] after three days.   WHEN TO CALL YOUR DOCTOR: 1. Fever over 101.0 2. Nausea and/or vomiting. 3. Extreme swelling or bruising. 4. Continued bleeding from incision. 5. Increased pain, redness, or drainage from the incision.  The clinic staff is available to answer your questions during regular business hours.  Please don't hesitate to call and ask to speak to one of the nurses for clinical concerns.  If you have a medical emergency, go to the nearest emergency  room or call 911.  A surgeon from [facility] is always on call at the hospital.  For further questions, please visit [email or web address]

## 2020-07-27 NOTE — Interval H&P Note (Signed)
History and Physical Interval Note:  07/27/2020 9:11 AM  Sherry Carrillo  has presented today for surgery, with the diagnosis of LEFT BREAST CANCER.  The various methods of treatment have been discussed with the patient and family. After consideration of risks, benefits and other options for treatment, the patient has consented to  Procedure(s): RE-EXCISION OF LEFT BREAST LUMPECTOMY (Left) INSERTION PORT-A-CATH (N/A) as a surgical intervention.  The patient's history has been reviewed, patient examined, no change in status, stable for surgery.  I have reviewed the patient's chart and labs.  Questions were answered to the patient's satisfaction.     [name]

## 2020-07-28 ENCOUNTER — Encounter (HOSPITAL_COMMUNITY): Payer: Self-pay | Admitting: General Surgery

## 2020-07-29 LAB — SURGICAL PATHOLOGY

## 2020-08-01 ENCOUNTER — Encounter: Payer: Self-pay | Admitting: *Deleted

## 2020-08-03 ENCOUNTER — Telehealth: Payer: Self-pay | Admitting: Hematology

## 2020-08-03 NOTE — Telephone Encounter (Signed)
Scheduled follow-up appointments per 4/29 los. Patient is aware. 

## 2020-08-05 NOTE — Progress Notes (Signed)
[facility]   [phone] [phone]   Clinic Follow up Note   Patient Care Team: Vivi Barrack, MD as PCP - General (Family Medicine) Mauro Kaufmann, RN as Oncology Nurse Navigator Rockwell Germany, RN as Oncology Nurse Navigator [name], MD as Consulting Physician (General Surgery) Kyung Rudd, MD as Consulting Physician (Radiation Oncology) Truitt Merle, MD as Consulting Physician (Hematology)  Date of Service:  08/10/2020  CHIEF COMPLAINT: F/u of left breast cancer  SUMMARY OF ONCOLOGIC HISTORY: Oncology History Overview Note  Cancer Staging Malignant neoplasm of upper-outer quadrant of left breast, estrogen receptor positive ([place]) Staging form: Breast, AJCC 8th Edition - Clinical stage from 05/31/2020: Stage IIA (cT2, cN0, cM0, G3, ER+, PR+, HER2-) - Signed by Truitt Merle, MD on 06/01/2020 Stage prefix: Initial diagnosis    Malignant neoplasm of upper-outer quadrant of left breast, estrogen receptor positive ([place])  05/17/2020 Mammogram   IMPRESSION: 1. Highly suspicious 2.3 cm mass involving the UPPER OUTER QUADRANT of the LEFT breast at the 1 o'clock position approximately 8 cm from the nipple. 2. No pathologic LEFT axillary lymphadenopathy. 3. No mammographic evidence of malignancy involving the RIGHT breast.   05/17/2020 Initial Biopsy   Diagnosis Breast, left, needle core biopsy, 1 o'clock - INVASIVE MAMMARY CARCINOMA.  Microscopic Comment The carcinoma appears grade 3. The greatest linear extent of tumor in any one core is 10 mm. E-cadherin will be reported separately. Ancillary studies will be reported separately. Results reported to [facility] on 05/18/2020. Dr. Tresa Moore reviewed the case. ADDENDUM: Immunohistochemistry for E-cadherin is positive supporting a ductal phenotype.    05/17/2020 Receptors her2   PROGNOSTIC INDICATORS Results: IMMUNOHISTOCHEMICAL AND MORPHOMETRIC ANALYSIS PERFORMED  MANUALLY The tumor cells are NEGATIVE for Her2 (1+). Estrogen Receptor: 90%, POSITIVE, STRONG STAINING INTENSITY Progesterone Receptor: 60%, POSITIVE, STRONG STAINING INTENSITY Proliferation Marker Ki67: 70%   05/31/2020 Initial Diagnosis   Malignant neoplasm of upper-outer quadrant of left breast, estrogen receptor positive ([place])   05/31/2020 Cancer Staging   Staging form: Breast, AJCC 8th Edition - Clinical stage from 05/31/2020: Stage IIA (cT2, cN0, cM0, G3, ER+, PR+, HER2-) - Signed by Truitt Merle, MD on 06/01/2020 Stage prefix: Initial diagnosis   06/03/2020 Cancer Staging   Staging form: Breast, AJCC 8th Edition - Pathologic stage from 06/03/2020: Stage IIA (pT2, pN1a, cM0, G3, ER+, PR+, HER2-) - Signed by Truitt Merle, MD on 06/29/2020 Stage prefix: Initial diagnosis Multigene prognostic tests performed: MammaPrint Histologic grading system: 3 grade system   06/03/2020 Surgery   Left breast lumpectomy with left axillary sentinel lymph node  biopsies  A. 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  - High grade ductal carcinoma in situ.  - Invasive carcinoma is at the inferior margin focally and <0.1 cm from  the anterior margin focally.  - In situ carcinoma is <0.1 cm from the anterior margin focally.  - Biopsy site.  - See oncology table.   B. 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   C. LYMPH NODE, LEFT AXILLARY, #2, SENTINEL, EXCISION:  - One of one lymph nodes negative for carcinoma (0/1).   07/20/2020 Imaging   CT CAP  IMPRESSION: 1. Surgical clips in the area of the LEFT breast laterally along the margin of water density well-circumscribed area that measures approximately 4.2 x 3.7 cm. This is likely a postoperative seroma. Correlate with any pain  or developing symptoms that would suggest infection. No gas or secondary findings to suggest this at this time. 2. No  evidence of metastatic disease in the chest, abdomen or pelvis. 3. Hepatic steatosis without focal lesion. 4. Calcified coronary artery disease. 5. Aortic atherosclerosis. 6. Mildly nodular thyroid. 7. Lesion in the RIGHT thyroid measuring up to 1.8 cm. Recommend thyroid US (ref: J Am Coll Radiol. 2015 Feb;12(2): 143-50).     07/20/2020 Imaging   Bone Scan  IMPRESSION: Signs of spondylosis of the thoracic and lumbar spine. No scintigraphic evidence of metastatic disease.     07/27/2020 Surgery   RE-EXCISION OF LEFT BREAST LUMPECTOMY and PAC placement by Dr Barry Dienes  FINAL MICROSCOPIC DIAGNOSIS:   A. BREAST, LEFT ANTERIOR MARGIN, EXCISION:  - Prior procedure site changes.  No carcinoma identified.   B. BREAST, LEFT INFERIOR MARGIN, EXCISION:  - Prior procedure site changes.  No carcinoma identified.    07/27/2020 Pathology Results   FINAL MICROSCOPIC DIAGNOSIS:   A. BREAST, LEFT ANTERIOR MARGIN, EXCISION:  - Prior procedure site changes.  No carcinoma identified.   B. BREAST, LEFT INFERIOR MARGIN, EXCISION:  - Prior procedure site changes.  No carcinoma identified.     08/18/2020 -  Chemotherapy   docetaxel and Cytoxan (TC) Q3weeks for 4 cycles.        CURRENT THERAPY:  docetaxel and Cytoxan (TC) Q3weeks for 4 cycles starting 08/18/20  INTERVAL HISTORY:  Sherry Carrillo is here for a follow up. She presents to the clinic alone. She notes her re-excision surgery went well. After today she is overall ready to proceed with start of chemo next week. I reviewed her medication list with her. She notes she did not pick up her Effexor yet. She notes she is postmenopausal in 2019. She is no longer on Hormonal replacement. She notes she plans to use dignicap, but she is still nervous about first use.     REVIEW OF SYSTEMS:   Constitutional: Denies fevers, chills or abnormal weight loss Eyes: Denies blurriness of vision Ears, nose, mouth, throat, and face: Denies mucositis or sore  throat Respiratory: Denies cough, dyspnea or wheezes Cardiovascular: Denies palpitation, chest discomfort or lower extremity swelling Gastrointestinal:  Denies nausea, heartburn or change in bowel habits Skin: Denies abnormal skin rashes Lymphatics: Denies new lymphadenopathy or easy bruising Neurological:Denies numbness, tingling or new weaknesses Behavioral/Psych: Mood is stable, no new changes  All other systems were reviewed with the patient and are negative.  MEDICAL HISTORY:  Past Medical History:  Diagnosis Date  . Abnormal pap 2009   PT HAD SURGERY BUT DON'T KNOW WHAT TYPE  . Breast cancer ([place]) 04/27/2020  . Headache(784.0)    otc meds prn  . Hypertension   . Hyperthyroidism   . SVD (spontaneous vaginal delivery)    x 2    SURGICAL HISTORY: Past Surgical History:  Procedure Laterality Date  . BREAST LUMPECTOMY WITH RADIOACTIVE SEED AND SENTINEL LYMPH NODE BIOPSY Left 06/03/2020   Procedure: RADIOACTIVE SEED GUIDED LEFT BREAST LUMPECTOMY, LEFT AXILLARY SENTINEL LYMPH NODE BIOPSY;  Surgeon: [name], MD;  Location: [facility];  Service: General;  Laterality: Left;  . FOOT SURGERY     left  . PORTACATH PLACEMENT Left 07/27/2020   Procedure: INSERTION PORT-A-CATH;  Surgeon: [name], MD;  Location: [facility];  Service: General;  Laterality: Left;  . RE-EXCISION OF BREAST LUMPECTOMY Left 07/27/2020   Procedure: RE-EXCISION OF LEFT BREAST LUMPECTOMY;  Surgeon: [name], MD;  Location: [facility];  Service: General;  Laterality: Left;  . TUBAL LIGATION    . WISDOM TOOTH EXTRACTION      I have reviewed the social history and family history with the patient and they are unchanged from previous note.  ALLERGIES:  is allergic to sulfa antibiotics.  MEDICATIONS:  Current Outpatient Medications  Medication Sig Dispense Refill  . dexamethasone (DECADRON) 4 MG tablet Take 1 tablet (4 mg total) by mouth 2 (two) times daily. Start the day before Taxotere. Then again 1 tab daily the  day after chemo for 3 days. 30 tablet 1  . lidocaine-prilocaine (EMLA) cream Apply to affected area once 30 g 3  . lisinopril-hydrochlorothiazide (ZESTORETIC) 20-12.5 MG tablet Take 1 tablet by mouth once daily 90 tablet 0  . ondansetron (ZOFRAN) 8 MG tablet Take 1 tablet (8 mg total) by mouth 2 (two) times daily as needed for refractory nausea / vomiting. Start on day 3 after chemo. 30 tablet 1  . prochlorperazine (COMPAZINE) 10 MG tablet Take 1 tablet (10 mg total) by mouth every 6 (six) hours as needed (Nausea or vomiting). 30 tablet 1  . venlafaxine (EFFEXOR) 37.5 MG tablet Take 1 tablet (37.5 mg total) by mouth 2 (two) times daily. (Patient not taking: Reported on 07/19/2020) 30 tablet 0   No current facility-administered medications for this visit.    PHYSICAL EXAMINATION: ECOG PERFORMANCE STATUS: 0  Vitals:   08/10/20 1135  BP: 113/61  Pulse: 75  Resp: 18  Temp: 97.7 F (36.5 C)  SpO2: 100%   Filed Weights   08/10/20 1135  Weight: 168 lb 3.2 oz (76.3 kg)    GENERAL:alert, no distress and comfortable SKIN: skin color, texture, turgor are normal, no rashes or significant lesions EYES: normal, Conjunctiva are pink and non-injected, sclera clear  NECK: supple, thyroid normal size, non-tender, without nodularity LYMPH:  no palpable lymphadenopathy in the cervical, axillary  LUNGS: clear to auscultation and percussion with normal breathing effort HEART: regular rate & rhythm and no murmurs and no lower extremity edema ABDOMEN:abdomen soft, non-tender and normal bowel sounds Musculoskeletal:no cyanosis of digits and no clubbing  NEURO: alert & oriented x 3 with fluent speech, no focal motor/sensory deficits BREAST: S/p left lumpectomy: surgical incision healed well (+) Mild breast swelling and hardness below incision. No palpable mass, nodules or adenopathy bilaterally. Breast exam benign.   LABORATORY DATA:  I have reviewed the data as listed CBC Latest Ref Rng & Units  06/01/2020 05/09/2020 05/15/2019  WBC 4.0 - 10.5 K/uL 8.4 8.0 5.0  Hemoglobin 12.0 - 15.0 g/dL 13.3 14.0 14.0  Hematocrit 36.0 - 46.0 % 42.0 43 44.0  Platelets 150 - 400 K/uL 258 148(A) 263     CMP Latest Ref Rng & Units 07/27/2020 07/20/2020 06/01/2020  Glucose 70 - 99 mg/dL 110(H) - 96  BUN 6 - 20 mg/dL 18 - 13  Creatinine 0.44 - 1.00 mg/dL 0.65 0.70 0.71  Sodium 135 - 145 mmol/L 139 - 138  Potassium 3.5 - 5.1 mmol/L 4.0 - 4.1  Chloride 98 - 111 mmol/L 105 - 102  CO2 22 - 32 mmol/L 28 - 26  Calcium 8.9 - 10.3 mg/dL 9.1 - 9.6  Total Protein 6.5 - 8.1 g/dL - - 7.2  Total Bilirubin 0.3 - 1.2 mg/dL - - 0.4  Alkaline Phos 38 - 126 U/L - - 67  AST 15 - 41 U/L - - 15  ALT 0 - 44 U/L - - 14      RADIOGRAPHIC STUDIES:  I have personally reviewed the radiological images as listed and agreed with the findings in the report. No results found.   ASSESSMENT & PLAN:  CINNAMON MORENCY is a 55 y.o. female with    1.Malignant neoplasm of upper-outer quadrant of left breast, StageIIA, pT2N1M0, stage IIA, ER+/PR+/HER2-, Azucena Kuba, Mammaprint high risk  -She underwent Left breast lumpectomy with left axillary sentinel lymph node biopsies on 06/03/20 under Dr. Barry Dienes. Pathology showed: 2.4 cm invasive ductal carcinoma, grade 3; DCIS; one positive lymph node (1/2); positive inferior margin. Her Mammaprint showed high risk luminal type.  -She underwent re-excision surgery on 07/27/20 with Dr Barry Dienes. Path showed no residual cancer.  -Her CT CAP and bone scan from 07/27/20 were negative for distant mets.  -To reduce her risk of cancer recurrence, I recommended adjuvant chemo with docetaxel and Cytoxan (TC) Q3weeks for 4 cycles. I reviewed side effects with her today. Chemo consent obtained. She has recovered well from surgery and will start on 08/18/20.  -She has PAC in place and will proceed with Chemo education class today. She plans to use dignicap during infusion. She is still very nervous about her start of chemo  and dignicap.  Will add benadryl as premed, she rather sleeps through her treatment  -F/u in 2 weeks.   2. Hot flashes -She was seen to be post-menopausal on 12/2017 labs with [facility] -She started having hot flashes in Fall 2021. Her gyn started on her Estradial patch in 04/2020 which helps very well. Due to ER/PR positive disease, she stopped in March.  -I prescribed SSRI with Effexor to help her hot flashes on 06/01/20, but has not started it yet.  -I recommend obtaining baseline DEXA in 2022 given she is post-menopausal.   3. Abnormal Pap Smear 04/2020, Endometrium biopsy 06/07/20 was benign  4. Social Support -She is married with 2 adult daughters. Her family is aware of her breast cancer diagnosis.  -She is working on coping better with diagnosis and chemo.   5. She declined Genetic Testing (08/10/20)   PLAN: -I called in EMLA cream, Dexamethasone (lower dose per pt request) and antiemetic today  -Lab, flush, and TC on 5/26, 6/16, 7/7, 7/28 -F/u in 2 weeks for toxicity check up after first cycle chemo .    No problem-specific Assessment & Plan notes found for this encounter.   No orders of the defined types were placed in this encounter.  All questions were answered. The patient knows to call the clinic with any problems, questions or concerns. No barriers to learning was detected. The total time spent in the appointment was 30 minutes.     Truitt Merle, MD 08/10/2020   I, Joslyn Devon, am acting as scribe for Truitt Merle, MD.   I have reviewed the above documentation for accuracy and completeness, and I agree with the above.

## 2020-08-10 ENCOUNTER — Encounter: Payer: Self-pay | Admitting: Hematology

## 2020-08-10 ENCOUNTER — Inpatient Hospital Stay: Payer: BC Managed Care – PPO

## 2020-08-10 ENCOUNTER — Telehealth: Payer: Self-pay | Admitting: Hematology

## 2020-08-10 ENCOUNTER — Other Ambulatory Visit: Payer: Self-pay

## 2020-08-10 ENCOUNTER — Inpatient Hospital Stay: Payer: BC Managed Care – PPO | Attending: Hematology | Admitting: Hematology

## 2020-08-10 ENCOUNTER — Other Ambulatory Visit: Payer: Self-pay | Admitting: Hematology

## 2020-08-10 VITALS — BP 113/61 | HR 75 | Temp 97.7°F | Resp 18 | Wt 168.2 lb

## 2020-08-10 DIAGNOSIS — K760 Fatty (change of) liver, not elsewhere classified: Secondary | ICD-10-CM | POA: Insufficient documentation

## 2020-08-10 DIAGNOSIS — R232 Flushing: Secondary | ICD-10-CM | POA: Insufficient documentation

## 2020-08-10 DIAGNOSIS — Z5111 Encounter for antineoplastic chemotherapy: Secondary | ICD-10-CM | POA: Insufficient documentation

## 2020-08-10 DIAGNOSIS — C50412 Malignant neoplasm of upper-outer quadrant of left female breast: Secondary | ICD-10-CM

## 2020-08-10 DIAGNOSIS — E041 Nontoxic single thyroid nodule: Secondary | ICD-10-CM | POA: Diagnosis not present

## 2020-08-10 DIAGNOSIS — Z170 Estrogen receptor positive status [ER+]: Secondary | ICD-10-CM | POA: Diagnosis not present

## 2020-08-10 DIAGNOSIS — Z79899 Other long term (current) drug therapy: Secondary | ICD-10-CM | POA: Diagnosis not present

## 2020-08-10 DIAGNOSIS — I700 Atherosclerosis of aorta: Secondary | ICD-10-CM | POA: Diagnosis not present

## 2020-08-10 DIAGNOSIS — I2510 Atherosclerotic heart disease of native coronary artery without angina pectoris: Secondary | ICD-10-CM | POA: Insufficient documentation

## 2020-08-10 MED ORDER — LIDOCAINE-PRILOCAINE 2.5-2.5 % EX CREA: TOPICAL_CREAM | CUTANEOUS | 3 refills | Status: DC

## 2020-08-10 MED ORDER — DEXAMETHASONE 4 MG PO TABS: 4.0000 mg | ORAL_TABLET | Freq: Two times a day (BID) | ORAL | 1 refills | Status: DC

## 2020-08-10 MED ORDER — ONDANSETRON HCL 8 MG PO TABS: 8.0000 mg | ORAL_TABLET | Freq: Two times a day (BID) | ORAL | 1 refills | Status: DC | PRN

## 2020-08-10 MED ORDER — PROCHLORPERAZINE MALEATE 10 MG PO TABS: 10.0000 mg | ORAL_TABLET | Freq: Four times a day (QID) | ORAL | 1 refills | Status: DC | PRN

## 2020-08-10 NOTE — Telephone Encounter (Signed)
Sch per 5/18 los , pt aware 

## 2020-08-11 ENCOUNTER — Encounter: Payer: Self-pay | Admitting: General Practice

## 2020-08-11 NOTE — Progress Notes (Signed)
[facility] Psychosocial Distress Screening Clinical Social Work  Clinical Social Work was referred by distress screening protocol.  The patient scored a 5 on the Psychosocial Distress Thermometer which indicates moderate distress. Clinical Social Worker contacted patient by phone to assess for distress and other psychosocial needs. Is primarily stressed by her job and its demands.  She has determined to approach her treatment with a positive attitude.  Trusts her physician and treatment team.  Appreciates support she has received thus far, does not think she needs referrals for support groups or Alight guide at this point.  She knows to recontact [place] if we can provide any resources or support during her journey.   ONCBCN DISTRESS SCREENING 08/10/2020  Screening Type Initial Screening  Distress experienced in past week (1-10) 5  Practical problem type Work/school  Emotional problem type Adjusting to illness  Physical Problem type Sleep/insomnia  Referral to clinical social work Yes  Other can be reached at [phone]    Clinical Social Worker follow up needed: No.  If yes, follow up plan:  Beverely Pace, [place], LCSW Clinical Social Worker Phone:  [phone]

## 2020-08-11 NOTE — Progress Notes (Signed)
The following biosimilar Udenyca ([id number]) has been selected for use in this patient as required by insurance.  Henreitta Leber, PharmD 08/11/20 @ [phone]

## 2020-08-12 ENCOUNTER — Telehealth: Payer: Self-pay | Admitting: Hematology

## 2020-08-12 NOTE — Telephone Encounter (Signed)
Scheduled appt per 5/19 sch msg. Pt aware.  

## 2020-08-15 LAB — COLOGUARD: Cologuard: NEGATIVE

## 2020-08-16 LAB — COLOGUARD

## 2020-08-17 ENCOUNTER — Encounter: Payer: Self-pay | Admitting: Family Medicine

## 2020-08-18 ENCOUNTER — Inpatient Hospital Stay: Payer: BC Managed Care – PPO

## 2020-08-18 ENCOUNTER — Encounter: Payer: Self-pay | Admitting: *Deleted

## 2020-08-18 ENCOUNTER — Other Ambulatory Visit: Payer: Self-pay

## 2020-08-18 ENCOUNTER — Other Ambulatory Visit: Payer: BC Managed Care – PPO

## 2020-08-18 VITALS — BP 125/79 | HR 93 | Temp 98.6°F | Resp 18 | Wt 165.5 lb

## 2020-08-18 DIAGNOSIS — C50412 Malignant neoplasm of upper-outer quadrant of left female breast: Secondary | ICD-10-CM

## 2020-08-18 DIAGNOSIS — Z170 Estrogen receptor positive status [ER+]: Secondary | ICD-10-CM

## 2020-08-18 DIAGNOSIS — Z95828 Presence of other vascular implants and grafts: Secondary | ICD-10-CM | POA: Insufficient documentation

## 2020-08-18 LAB — CBC WITH DIFFERENTIAL (CANCER CENTER ONLY)
Abs Immature Granulocytes: 0.05 10*3/uL (ref 0.00–0.07)
Basophils Absolute: 0 10*3/uL (ref 0.0–0.1)
Basophils Relative: 0 %
Eosinophils Absolute: 0 10*3/uL (ref 0.0–0.5)
Eosinophils Relative: 0 %
HCT: 38.9 % (ref 36.0–46.0)
Hemoglobin: 13 g/dL (ref 12.0–15.0)
Immature Granulocytes: 0 %
Lymphocytes Relative: 11 %
Lymphs Abs: 1.6 10*3/uL (ref 0.7–4.0)
MCH: 27.8 pg (ref 26.0–34.0)
MCHC: 33.4 g/dL (ref 30.0–36.0)
MCV: 83.3 fL (ref 80.0–100.0)
Monocytes Absolute: 0.7 10*3/uL (ref 0.1–1.0)
Monocytes Relative: 5 %
Neutro Abs: 11.8 10*3/uL — ABNORMAL HIGH (ref 1.7–7.7)
Neutrophils Relative %: 84 %
Platelet Count: 298 10*3/uL (ref 150–400)
RBC: 4.67 MIL/uL (ref 3.87–5.11)
RDW: 12.6 % (ref 11.5–15.5)
WBC Count: 14.2 10*3/uL — ABNORMAL HIGH (ref 4.0–10.5)
nRBC: 0 % (ref 0.0–0.2)

## 2020-08-18 LAB — CMP (CANCER CENTER ONLY)
ALT: 31 U/L (ref 0–44)
AST: 20 U/L (ref 15–41)
Albumin: 3.7 g/dL (ref 3.5–5.0)
Alkaline Phosphatase: 86 U/L (ref 38–126)
Anion gap: 12 (ref 5–15)
BUN: 16 mg/dL (ref 6–20)
CO2: 24 mmol/L (ref 22–32)
Calcium: 9.8 mg/dL (ref 8.9–10.3)
Chloride: 104 mmol/L (ref 98–111)
Creatinine: 0.72 mg/dL (ref 0.44–1.00)
GFR, Estimated: >60 mL/min (ref >60)
Glucose, Bld: 140 mg/dL — ABNORMAL HIGH (ref 70–99)
Potassium: 3.8 mmol/L (ref 3.5–5.1)
Sodium: 140 mmol/L (ref 135–145)
Total Bilirubin: 0.2 mg/dL — ABNORMAL LOW (ref 0.3–1.2)
Total Protein: 7.3 g/dL (ref 6.5–8.1)

## 2020-08-18 MED ORDER — HEPARIN SOD (PORK) LOCK FLUSH 100 UNIT/ML IV SOLN
500.0000 [IU] | Freq: Once | INTRAVENOUS | Status: AC | PRN
  Administered 2020-08-18: 500 [IU]
  Filled 2020-08-18: qty 5

## 2020-08-18 MED ORDER — SODIUM CHLORIDE 0.9 % IV SOLN
75.0000 mg/m2 | Freq: Once | INTRAVENOUS | Status: AC
  Administered 2020-08-18: 130 mg via INTRAVENOUS
  Filled 2020-08-18: qty 13

## 2020-08-18 MED ORDER — SODIUM CHLORIDE 0.9 % IV SOLN
Freq: Once | INTRAVENOUS | Status: AC
  Filled 2020-08-18: qty 250

## 2020-08-18 MED ORDER — SODIUM CHLORIDE 0.9% FLUSH
10.0000 mL | Freq: Once | INTRAVENOUS | Status: AC
  Administered 2020-08-18: 10 mL
  Filled 2020-08-18: qty 10

## 2020-08-18 MED ORDER — SODIUM CHLORIDE 0.9 % IV SOLN
10.0000 mg | Freq: Once | INTRAVENOUS | Status: AC
  Administered 2020-08-18: 10 mg via INTRAVENOUS
  Filled 2020-08-18: qty 10

## 2020-08-18 MED ORDER — DIPHENHYDRAMINE HCL 50 MG/ML IJ SOLN
INTRAMUSCULAR | Status: AC
  Filled 2020-08-18: qty 1

## 2020-08-18 MED ORDER — DIPHENHYDRAMINE HCL 50 MG/ML IJ SOLN
25.0000 mg | Freq: Once | INTRAMUSCULAR | Status: AC
  Administered 2020-08-18: 25 mg via INTRAVENOUS

## 2020-08-18 MED ORDER — ACETAMINOPHEN 325 MG PO TABS
650.0000 mg | ORAL_TABLET | Freq: Once | ORAL | Status: AC
Start: 2020-08-18 — End: 2020-08-18
  Administered 2020-08-18: 650 mg via ORAL

## 2020-08-18 MED ORDER — PALONOSETRON HCL INJECTION 0.25 MG/5ML
0.2500 mg | Freq: Once | INTRAVENOUS | Status: AC
  Administered 2020-08-18: 0.25 mg via INTRAVENOUS

## 2020-08-18 MED ORDER — ACETAMINOPHEN 325 MG PO TABS
ORAL_TABLET | ORAL | Status: AC
  Filled 2020-08-18: qty 2

## 2020-08-18 MED ORDER — PALONOSETRON HCL INJECTION 0.25 MG/5ML
INTRAVENOUS | Status: AC
  Filled 2020-08-18: qty 5

## 2020-08-18 MED ORDER — SODIUM CHLORIDE 0.9% FLUSH
10.0000 mL | INTRAVENOUS | Status: DC | PRN
  Administered 2020-08-18: 10 mL
  Filled 2020-08-18: qty 10

## 2020-08-18 MED ORDER — SODIUM CHLORIDE 0.9 % IV SOLN
600.0000 mg/m2 | Freq: Once | INTRAVENOUS | Status: AC
  Administered 2020-08-18: 1060 mg via INTRAVENOUS
  Filled 2020-08-18: qty 53

## 2020-08-18 NOTE — Patient Instructions (Signed)
[facility]  Discharge Instructions: Thank you for choosing [facility] to provide your oncology and hematology care.   If you have a lab appointment with the [facility], please go directly to the [facility] and check in at the registration area.   Wear comfortable clothing and clothing appropriate for easy access to any Portacath or PICC line.   We strive to give you quality time with your provider. You may need to reschedule your appointment if you arrive late (15 or more minutes).  Arriving late affects you and other patients whose appointments are after yours.  Also, if you miss three or more appointments without notifying the office, you may be dismissed from the clinic at the provider's discretion.      For prescription refill requests, have your pharmacy contact our office and allow 72 hours for refills to be completed.    Today you received the following chemotherapy and/or immunotherapy agents docetaxel,  cytoxan   To help prevent nausea and vomiting after your treatment, we encourage you to take your nausea medication as directed.  BELOW ARE SYMPTOMS THAT SHOULD BE REPORTED IMMEDIATELY: . *FEVER GREATER THAN 100.4 F (38 C) OR HIGHER . *CHILLS OR SWEATING . *NAUSEA AND VOMITING THAT IS NOT CONTROLLED WITH YOUR NAUSEA MEDICATION . *UNUSUAL SHORTNESS OF BREATH . *UNUSUAL BRUISING OR BLEEDING . *URINARY PROBLEMS (pain or burning when urinating, or frequent urination) . *BOWEL PROBLEMS (unusual diarrhea, constipation, pain near the anus) . TENDERNESS IN MOUTH AND THROAT WITH OR WITHOUT PRESENCE OF ULCERS (sore throat, sores in mouth, or a toothache) . UNUSUAL RASH, SWELLING OR PAIN  . UNUSUAL VAGINAL DISCHARGE OR ITCHING   Items with * indicate a potential emergency and should be followed up as soon as possible or go to the Emergency Department if any problems should occur.  Please show the CHEMOTHERAPY ALERT CARD or IMMUNOTHERAPY  ALERT CARD at check-in to the Emergency Department and triage nurse.  Should you have questions after your visit or need to cancel or reschedule your appointment, please contact [facility]  Dept: [phone]  and follow the prompts.  Office hours are 8:00 a.m. to 4:30 p.m. Monday - Friday. Please note that voicemails left after 4:00 p.m. may not be returned until the following business day.  We are closed weekends and major holidays. You have access to a nurse at all times for urgent questions. Please call the main number to the clinic Dept: [phone] and follow the prompts.   For any non-urgent questions, you may also contact your provider using MyChart. We now offer e-Visits for anyone 70 and older to request care online for non-urgent symptoms. For details visit [email or web address].   Also download the MyChart app! Go to the app store, search "MyChart", open the app, select [facility], and log in with your MyChart username and password.  Due to Covid, a mask is required upon entering the hospital/clinic. If you do not have a mask, one will be given to you upon arrival. For doctor visits, patients may have 1 support person aged 9 or older with them. For treatment visits, patients cannot have anyone with them due to current Covid guidelines and our immunocompromised population.   Docetaxel injection What is this medicine? DOCETAXEL (doe se TAX el) is a chemotherapy drug. It targets fast dividing cells, like cancer cells, and causes these cells to die. This medicine is used to treat many types of cancers like  breast cancer, certain stomach cancers, head and neck cancer, lung cancer, and prostate cancer. This medicine may be used for other purposes; ask your health care provider or pharmacist if you have questions. COMMON BRAND NAME(S): Docefrez, Taxotere What should I tell my health care provider before I take this medicine? They need to know if you have any  of these conditions:  infection (especially a virus infection such as chickenpox, cold sores, or herpes)  liver disease  low blood counts, like low white cell, platelet, or red cell counts  an unusual or allergic reaction to docetaxel, polysorbate 80, other chemotherapy agents, other medicines, foods, dyes, or preservatives  pregnant or trying to get pregnant  breast-feeding How should I use this medicine? This drug is given as an infusion into a vein. It is administered in a hospital or clinic by a specially trained health care professional. Talk to your pediatrician regarding the use of this medicine in children. Special care may be needed. Overdosage: If you think you have taken too much of this medicine contact a poison control center or emergency room at once. NOTE: This medicine is only for you. Do not share this medicine with others. What if I miss a dose? It is important not to miss your dose. Call your doctor or health care professional if you are unable to keep an appointment. What may interact with this medicine? Do not take this medicine with any of the following medications:  live virus vaccines This medicine may also interact with the following medications:  aprepitant  certain antibiotics like erythromycin or clarithromycin  certain antivirals for HIV or hepatitis  certain medicines for fungal infections like fluconazole, itraconazole, ketoconazole, posaconazole, or voriconazole  cimetidine  ciprofloxacin  conivaptan  cyclosporine  dronedarone  fluvoxamine  grapefruit juice  imatinib  verapamil This list may not describe all possible interactions. Give your health care provider a list of all the medicines, herbs, non-prescription drugs, or dietary supplements you use. Also tell them if you smoke, drink alcohol, or use illegal drugs. Some items may interact with your medicine. What should I watch for while using this medicine? Your condition will be  monitored carefully while you are receiving this medicine. You will need important blood work done while you are taking this medicine. Call your doctor or health care professional for advice if you get a fever, chills or sore throat, or other symptoms of a cold or flu. Do not treat yourself. This drug decreases your body's ability to fight infections. Try to avoid being around people who are sick. Some products may contain alcohol. Ask your health care professional if this medicine contains alcohol. Be sure to tell all health care professionals you are taking this medicine. Certain medicines, like metronidazole and disulfiram, can cause an unpleasant reaction when taken with alcohol. The reaction includes flushing, headache, nausea, vomiting, sweating, and increased thirst. The reaction can last from 30 minutes to several hours. You may get drowsy or dizzy. Do not drive, use machinery, or do anything that needs mental alertness until you know how this medicine affects you. Do not stand or sit up quickly, especially if you are an older patient. This reduces the risk of dizzy or fainting spells. Alcohol may interfere with the effect of this medicine. Talk to your health care professional about your risk of cancer. You may be more at risk for certain types of cancer if you take this medicine. Do not become pregnant while taking this medicine or for 6  months after stopping it. Women should inform their doctor if they wish to become pregnant or think they might be pregnant. There is a potential for serious side effects to an unborn child. Talk to your health care professional or pharmacist for more information. Do not breast-feed an infant while taking this medicine or for 1 week after stopping it. Males who get this medicine must use a condom during sex with females who can get pregnant. If you get a woman pregnant, the baby could have birth defects. The baby could die before they are born. You will need to  continue wearing a condom for 3 months after stopping the medicine. Tell your health care provider right away if your partner becomes pregnant while you are taking this medicine. This may interfere with the ability to father a child. You should talk to your doctor or health care professional if you are concerned about your fertility. What side effects may I notice from receiving this medicine? Side effects that you should report to your doctor or health care professional as soon as possible:  allergic reactions like skin rash, itching or hives, swelling of the face, lips, or tongue  blurred vision  breathing problems  changes in vision  low blood counts - This drug may decrease the number of white blood cells, red blood cells and platelets. You may be at increased risk for infections and bleeding.  nausea and vomiting  pain, redness or irritation at site where injected  pain, tingling, numbness in the hands or feet  redness, blistering, peeling, or loosening of the skin, including inside the mouth  signs of decreased platelets or bleeding - bruising, pinpoint red spots on the skin, black, tarry stools, nosebleeds  signs of decreased red blood cells - unusually weak or tired, fainting spells, lightheadedness  signs of infection - fever or chills, cough, sore throat, pain or difficulty passing urine  swelling of the ankle, feet, hands Side effects that usually do not require medical attention (report to your doctor or health care professional if they continue or are bothersome):  constipation  diarrhea  fingernail or toenail changes  hair loss  loss of appetite  mouth sores  muscle pain This list may not describe all possible side effects. Call your doctor for medical advice about side effects. You may report side effects to FDA at 1-800-FDA-1088. Where should I keep my medicine? This drug is given in a hospital or clinic and will not be stored at home. NOTE: This sheet  is a summary. It may not cover all possible information. If you have questions about this medicine, talk to your doctor, pharmacist, or health care provider.  2021 Elsevier/Gold Standard (2019-02-09 19:50:31)  Cyclophosphamide Injection What is this medicine? CYCLOPHOSPHAMIDE (sye kloe FOSS fa mide) is a chemotherapy drug. It slows the growth of cancer cells. This medicine is used to treat many types of cancer like lymphoma, myeloma, leukemia, breast cancer, and ovarian cancer, to name a few. This medicine may be used for other purposes; ask your health care provider or pharmacist if you have questions. COMMON BRAND NAME(S): Cytoxan, Neosar What should I tell my health care provider before I take this medicine? They need to know if you have any of these conditions:  heart disease  history of irregular heartbeat  infection  kidney disease  liver disease  low blood counts, like white cells, platelets, or red blood cells  on hemodialysis  recent or ongoing radiation therapy  scarring or thickening of  the lungs  trouble passing urine  an unusual or allergic reaction to cyclophosphamide, other medicines, foods, dyes, or preservatives  pregnant or trying to get pregnant  breast-feeding How should I use this medicine? This drug is usually given as an injection into a vein or muscle or by infusion into a vein. It is administered in a hospital or clinic by a specially trained health care professional. Talk to your pediatrician regarding the use of this medicine in children. Special care may be needed. Overdosage: If you think you have taken too much of this medicine contact a poison control center or emergency room at once. NOTE: This medicine is only for you. Do not share this medicine with others. What if I miss a dose? It is important not to miss your dose. Call your doctor or health care professional if you are unable to keep an appointment. What may interact with this  medicine?  amphotericin B  azathioprine  certain antivirals for HIV or hepatitis  certain medicines for blood pressure, heart disease, irregular heart beat  certain medicines that treat or prevent blood clots like warfarin  certain other medicines for cancer  cyclosporine  etanercept  indomethacin  medicines that relax muscles for surgery  medicines to increase blood counts  metronidazole This list may not describe all possible interactions. Give your health care provider a list of all the medicines, herbs, non-prescription drugs, or dietary supplements you use. Also tell them if you smoke, drink alcohol, or use illegal drugs. Some items may interact with your medicine. What should I watch for while using this medicine? Your condition will be monitored carefully while you are receiving this medicine. You may need blood work done while you are taking this medicine. Drink water or other fluids as directed. Urinate often, even at night. Some products may contain alcohol. Ask your health care professional if this medicine contains alcohol. Be sure to tell all health care professionals you are taking this medicine. Certain medicines, like metronidazole and disulfiram, can cause an unpleasant reaction when taken with alcohol. The reaction includes flushing, headache, nausea, vomiting, sweating, and increased thirst. The reaction can last from 30 minutes to several hours. Do not become pregnant while taking this medicine or for 1 year after stopping it. Women should inform their health care professional if they wish to become pregnant or think they might be pregnant. Men should not father a child while taking this medicine and for 4 months after stopping it. There is potential for serious side effects to an unborn child. Talk to your health care professional for more information. Do not breast-feed an infant while taking this medicine or for 1 week after stopping it. This medicine has  caused ovarian failure in some women. This medicine may make it more difficult to get pregnant. Talk to your health care professional if you are concerned about your fertility. This medicine has caused decreased sperm counts in some men. This may make it more difficult to father a child. Talk to your health care professional if you are concerned about your fertility. Call your health care professional for advice if you get a fever, chills, or sore throat, or other symptoms of a cold or flu. Do not treat yourself. This medicine decreases your body's ability to fight infections. Try to avoid being around people who are sick. Avoid taking medicines that contain aspirin, acetaminophen, ibuprofen, naproxen, or ketoprofen unless instructed by your health care professional. These medicines may hide a fever. Talk to your  health care professional about your risk of cancer. You may be more at risk for certain types of cancer if you take this medicine. If you are going to need surgery or other procedure, tell your health care professional that you are using this medicine. Be careful brushing or flossing your teeth or using a toothpick because you may get an infection or bleed more easily. If you have any dental work done, tell your dentist you are receiving this medicine. What side effects may I notice from receiving this medicine? Side effects that you should report to your doctor or health care professional as soon as possible:  allergic reactions like skin rash, itching or hives, swelling of the face, lips, or tongue  breathing problems  nausea, vomiting  signs and symptoms of bleeding such as bloody or black, tarry stools; red or dark brown urine; spitting up blood or brown material that looks like coffee grounds; red spots on the skin; unusual bruising or bleeding from the eyes, gums, or nose  signs and symptoms of heart failure like fast, irregular heartbeat, sudden weight gain; swelling of the ankles,  feet, hands  signs and symptoms of infection like fever; chills; cough; sore throat; pain or trouble passing urine  signs and symptoms of kidney injury like trouble passing urine or change in the amount of urine  signs and symptoms of liver injury like dark yellow or brown urine; general ill feeling or flu-like symptoms; light-colored stools; loss of appetite; nausea; right upper belly pain; unusually weak or tired; yellowing of the eyes or skin Side effects that usually do not require medical attention (report to your doctor or health care professional if they continue or are bothersome):  confusion  decreased hearing  diarrhea  facial flushing  hair loss  headache  loss of appetite  missed menstrual periods  signs and symptoms of low red blood cells or anemia such as unusually weak or tired; feeling faint or lightheaded; falls  skin discoloration This list may not describe all possible side effects. Call your doctor for medical advice about side effects. You may report side effects to FDA at 1-800-FDA-1088. Where should I keep my medicine? This drug is given in a hospital or clinic and will not be stored at home. NOTE: This sheet is a summary. It may not cover all possible information. If you have questions about this medicine, talk to your doctor, pharmacist, or health care provider.  2021 Elsevier/Gold Standard (2018-12-15 09:53:29)

## 2020-08-19 ENCOUNTER — Other Ambulatory Visit: Payer: Self-pay

## 2020-08-19 ENCOUNTER — Inpatient Hospital Stay: Payer: BC Managed Care – PPO

## 2020-08-19 VITALS — BP 130/80 | HR 71 | Temp 98.4°F | Resp 18

## 2020-08-19 DIAGNOSIS — C50412 Malignant neoplasm of upper-outer quadrant of left female breast: Secondary | ICD-10-CM | POA: Diagnosis not present

## 2020-08-19 DIAGNOSIS — Z170 Estrogen receptor positive status [ER+]: Secondary | ICD-10-CM

## 2020-08-19 MED ORDER — PEGFILGRASTIM-CBQV 6 MG/0.6ML ~~LOC~~ SOSY
6.0000 mg | PREFILLED_SYRINGE | Freq: Once | SUBCUTANEOUS | Status: AC
  Administered 2020-08-19: 6 mg via SUBCUTANEOUS

## 2020-08-19 MED ORDER — PEGFILGRASTIM-CBQV 6 MG/0.6ML ~~LOC~~ SOSY
PREFILLED_SYRINGE | SUBCUTANEOUS | Status: AC
  Filled 2020-08-19: qty 0.6

## 2020-08-19 NOTE — Patient Instructions (Signed)
Pegfilgrastim injection What is this medicine? PEGFILGRASTIM (PEG fil gra stim) is a long-acting granulocyte colony-stimulating factor that stimulates the growth of neutrophils, a type of white blood cell important in the body's fight against infection. It is used to reduce the incidence of fever and infection in patients with certain types of cancer who are receiving chemotherapy that affects the bone marrow, and to increase survival after being exposed to high doses of radiation. This medicine may be used for other purposes; ask your health care provider or pharmacist if you have questions. COMMON BRAND NAME(S): Fulphila, Neulasta, Nyvepria, UDENYCA, Ziextenzo What should I tell my health care provider before I take this medicine? They need to know if you have any of these conditions:  kidney disease  latex allergy  ongoing radiation therapy  sickle cell disease  skin reactions to acrylic adhesives (On-Body Injector only)  an unusual or allergic reaction to pegfilgrastim, filgrastim, other medicines, foods, dyes, or preservatives  pregnant or trying to get pregnant  breast-feeding How should I use this medicine? This medicine is for injection under the skin. If you get this medicine at home, you will be taught how to prepare and give the pre-filled syringe or how to use the On-body Injector. Refer to the patient Instructions for Use for detailed instructions. Use exactly as directed. Tell your healthcare provider immediately if you suspect that the On-body Injector may not have performed as intended or if you suspect the use of the On-body Injector resulted in a missed or partial dose. It is important that you put your used needles and syringes in a special sharps container. Do not put them in a trash can. If you do not have a sharps container, call your pharmacist or healthcare provider to get one. Talk to your pediatrician regarding the use of this medicine in children. While this drug  may be prescribed for selected conditions, precautions do apply. Overdosage: If you think you have taken too much of this medicine contact a poison control center or emergency room at once. NOTE: This medicine is only for you. Do not share this medicine with others. What if I miss a dose? It is important not to miss your dose. Call your doctor or health care professional if you miss your dose. If you miss a dose due to an On-body Injector failure or leakage, a new dose should be administered as soon as possible using a single prefilled syringe for manual use. What may interact with this medicine? Interactions have not been studied. This list may not describe all possible interactions. Give your health care provider a list of all the medicines, herbs, non-prescription drugs, or dietary supplements you use. Also tell them if you smoke, drink alcohol, or use illegal drugs. Some items may interact with your medicine. What should I watch for while using this medicine? Your condition will be monitored carefully while you are receiving this medicine. You may need blood work done while you are taking this medicine. Talk to your health care provider about your risk of cancer. You may be more at risk for certain types of cancer if you take this medicine. If you are going to need a MRI, CT scan, or other procedure, tell your doctor that you are using this medicine (On-Body Injector only). What side effects may I notice from receiving this medicine? Side effects that you should report to your doctor or health care professional as soon as possible:  allergic reactions (skin rash, itching or hives, swelling of   the face, lips, or tongue)  back pain  dizziness  fever  pain, redness, or irritation at site where injected  pinpoint red spots on the skin  red or dark-brown urine  shortness of breath or breathing problems  stomach or side pain, or pain at the shoulder  swelling  tiredness  trouble  passing urine or change in the amount of urine  unusual bruising or bleeding Side effects that usually do not require medical attention (report to your doctor or health care professional if they continue or are bothersome):  bone pain  muscle pain This list may not describe all possible side effects. Call your doctor for medical advice about side effects. You may report side effects to FDA at 1-800-FDA-1088. Where should I keep my medicine? Keep out of the reach of children. If you are using this medicine at home, you will be instructed on how to store it. Throw away any unused medicine after the expiration date on the label. NOTE: This sheet is a summary. It may not cover all possible information. If you have questions about this medicine, talk to your doctor, pharmacist, or health care provider.  2021 Elsevier/Gold Standard (2019-04-03 13:20:51)  

## 2020-08-23 ENCOUNTER — Other Ambulatory Visit: Payer: Self-pay

## 2020-08-23 ENCOUNTER — Inpatient Hospital Stay (HOSPITAL_COMMUNITY)
Admission: EM | Admit: 2020-08-23 | Discharge: 2020-08-27 | DRG: 809 | Disposition: A | Discharge status: Home / Self Care | Payer: BC Managed Care – PPO | Attending: Internal Medicine | Admitting: Internal Medicine

## 2020-08-23 ENCOUNTER — Encounter (HOSPITAL_COMMUNITY): Payer: Self-pay | Admitting: *Deleted

## 2020-08-23 ENCOUNTER — Inpatient Hospital Stay (HOSPITAL_COMMUNITY): Payer: BC Managed Care – PPO

## 2020-08-23 ENCOUNTER — Emergency Department (HOSPITAL_COMMUNITY): Payer: BC Managed Care – PPO

## 2020-08-23 DIAGNOSIS — R5081 Fever presenting with conditions classified elsewhere: Secondary | ICD-10-CM

## 2020-08-23 DIAGNOSIS — Z79899 Other long term (current) drug therapy: Secondary | ICD-10-CM

## 2020-08-23 DIAGNOSIS — J9811 Atelectasis: Secondary | ICD-10-CM | POA: Diagnosis present

## 2020-08-23 DIAGNOSIS — D649 Anemia, unspecified: Secondary | ICD-10-CM | POA: Diagnosis present

## 2020-08-23 DIAGNOSIS — E669 Obesity, unspecified: Secondary | ICD-10-CM | POA: Diagnosis present

## 2020-08-23 DIAGNOSIS — K219 Gastro-esophageal reflux disease without esophagitis: Secondary | ICD-10-CM | POA: Diagnosis present

## 2020-08-23 DIAGNOSIS — Z20822 Contact with and (suspected) exposure to covid-19: Secondary | ICD-10-CM | POA: Diagnosis present

## 2020-08-23 DIAGNOSIS — D709 Neutropenia, unspecified: Principal | ICD-10-CM

## 2020-08-23 DIAGNOSIS — D696 Thrombocytopenia, unspecified: Secondary | ICD-10-CM | POA: Diagnosis present

## 2020-08-23 DIAGNOSIS — Z8349 Family history of other endocrine, nutritional and metabolic diseases: Secondary | ICD-10-CM

## 2020-08-23 DIAGNOSIS — Z8249 Family history of ischemic heart disease and other diseases of the circulatory system: Secondary | ICD-10-CM | POA: Diagnosis not present

## 2020-08-23 DIAGNOSIS — R0989 Other specified symptoms and signs involving the circulatory and respiratory systems: Secondary | ICD-10-CM

## 2020-08-23 DIAGNOSIS — E0590 Thyrotoxicosis, unspecified without thyrotoxic crisis or storm: Secondary | ICD-10-CM | POA: Diagnosis present

## 2020-08-23 DIAGNOSIS — Z170 Estrogen receptor positive status [ER+]: Secondary | ICD-10-CM | POA: Diagnosis not present

## 2020-08-23 DIAGNOSIS — Z6831 Body mass index (BMI) 31.0-31.9, adult: Secondary | ICD-10-CM | POA: Diagnosis not present

## 2020-08-23 DIAGNOSIS — C50412 Malignant neoplasm of upper-outer quadrant of left female breast: Secondary | ICD-10-CM | POA: Diagnosis present

## 2020-08-23 DIAGNOSIS — E871 Hypo-osmolality and hyponatremia: Secondary | ICD-10-CM | POA: Diagnosis present

## 2020-08-23 DIAGNOSIS — F419 Anxiety disorder, unspecified: Secondary | ICD-10-CM | POA: Diagnosis present

## 2020-08-23 DIAGNOSIS — I10 Essential (primary) hypertension: Secondary | ICD-10-CM | POA: Diagnosis present

## 2020-08-23 DIAGNOSIS — Z882 Allergy status to sulfonamides status: Secondary | ICD-10-CM | POA: Diagnosis not present

## 2020-08-23 DIAGNOSIS — E872 Acidosis: Secondary | ICD-10-CM | POA: Diagnosis present

## 2020-08-23 DIAGNOSIS — Z833 Family history of diabetes mellitus: Secondary | ICD-10-CM | POA: Diagnosis not present

## 2020-08-23 DIAGNOSIS — D72829 Elevated white blood cell count, unspecified: Secondary | ICD-10-CM | POA: Diagnosis not present

## 2020-08-23 DIAGNOSIS — R945 Abnormal results of liver function studies: Secondary | ICD-10-CM | POA: Diagnosis not present

## 2020-08-23 LAB — DIFFERENTIAL
Abs Immature Granulocytes: 0.02 10*3/uL (ref 0.00–0.07)
Band Neutrophils: 1 %
Basophils Absolute: 0.1 10*3/uL (ref 0.0–0.1)
Basophils Relative: 3 %
Blasts: 0 %
Eosinophils Absolute: 0.1 10*3/uL (ref 0.0–0.5)
Eosinophils Relative: 4 %
Lymphocytes Relative: 37 %
Lymphs Abs: 0.6 10*3/uL — ABNORMAL LOW (ref 0.7–4.0)
Metamyelocytes Relative: 0 %
Monocytes Absolute: 0.2 10*3/uL (ref 0.1–1.0)
Monocytes Relative: 5 %
Myelocytes: 1 %
Neutro Abs: 1 10*3/uL — ABNORMAL LOW (ref 1.7–7.7)
Neutrophils Relative %: 49 %
Other: 0 %
Promyelocytes Relative: 0 %
nRBC: 0 /100 WBC

## 2020-08-23 LAB — CBC
HCT: 45 % (ref 36.0–46.0)
HCT: 37.2 % (ref 36.0–46.0)
Hemoglobin: 14.4 g/dL (ref 12.0–15.0)
Hemoglobin: 12.1 g/dL (ref 12.0–15.0)
MCH: 27.8 pg (ref 26.0–34.0)
MCH: 28.1 pg (ref 26.0–34.0)
MCHC: 32 g/dL (ref 30.0–36.0)
MCHC: 32.5 g/dL (ref 30.0–36.0)
MCV: 86.9 fL (ref 80.0–100.0)
MCV: 86.3 fL (ref 80.0–100.0)
Platelets: UNDETERMINED 10*3/uL (ref 150–400)
Platelets: 145 10*3/uL — ABNORMAL LOW (ref 150–400)
RBC: 5.18 MIL/uL — ABNORMAL HIGH (ref 3.87–5.11)
RBC: 4.31 MIL/uL (ref 3.87–5.11)
RDW: 12.7 % (ref 11.5–15.5)
RDW: 12.6 % (ref 11.5–15.5)
WBC: 1.9 10*3/uL — ABNORMAL LOW (ref 4.0–10.5)
WBC: 1.5 10*3/uL — ABNORMAL LOW (ref 4.0–10.5)
nRBC: 0 % (ref 0.0–0.2)
nRBC: 0 % (ref 0.0–0.2)

## 2020-08-23 LAB — URINALYSIS, ROUTINE W REFLEX MICROSCOPIC
Bilirubin Urine: NEGATIVE
Glucose, UA: NEGATIVE mg/dL
Hgb urine dipstick: NEGATIVE
Ketones, ur: NEGATIVE mg/dL
Nitrite: NEGATIVE
Protein, ur: NEGATIVE mg/dL
Specific Gravity, Urine: 1.024 (ref 1.005–1.030)
WBC, UA: >50 WBC/hpf — ABNORMAL HIGH (ref 0–5)
pH: 6 (ref 5.0–8.0)

## 2020-08-23 LAB — CMP (CANCER CENTER ONLY)
ALT: 32 U/L (ref 0–44)
AST: 22 U/L (ref 15–41)
Albumin: 3.6 g/dL (ref 3.5–5.0)
Alkaline Phosphatase: 81 U/L (ref 38–126)
Anion gap: 8 (ref 5–15)
BUN: 13 mg/dL (ref 6–20)
CO2: 26 mmol/L (ref 22–32)
Calcium: 9 mg/dL (ref 8.9–10.3)
Chloride: 102 mmol/L (ref 98–111)
Creatinine: 0.66 mg/dL (ref 0.44–1.00)
GFR, Estimated: >60 mL/min (ref >60)
Glucose, Bld: 103 mg/dL — ABNORMAL HIGH (ref 70–99)
Potassium: 4.4 mmol/L (ref 3.5–5.1)
Sodium: 136 mmol/L (ref 135–145)
Total Bilirubin: 0.9 mg/dL (ref 0.3–1.2)
Total Protein: 6.8 g/dL (ref 6.5–8.1)

## 2020-08-23 LAB — COMPREHENSIVE METABOLIC PANEL
ALT: 39 U/L (ref 0–44)
AST: 29 U/L (ref 15–41)
Albumin: 3.9 g/dL (ref 3.5–5.0)
Alkaline Phosphatase: 96 U/L (ref 38–126)
Anion gap: 8 (ref 5–15)
BUN: 15 mg/dL (ref 6–20)
CO2: 27 mmol/L (ref 22–32)
Calcium: 9.3 mg/dL (ref 8.9–10.3)
Chloride: 99 mmol/L (ref 98–111)
Creatinine, Ser: 0.78 mg/dL (ref 0.44–1.00)
GFR, Estimated: >60 mL/min (ref >60)
Glucose, Bld: 111 mg/dL — ABNORMAL HIGH (ref 70–99)
Potassium: 4.5 mmol/L (ref 3.5–5.1)
Sodium: 134 mmol/L — ABNORMAL LOW (ref 135–145)
Total Bilirubin: 0.4 mg/dL (ref 0.3–1.2)
Total Protein: 7.6 g/dL (ref 6.5–8.1)

## 2020-08-23 LAB — RESP PANEL BY RT-PCR (FLU A&B, COVID) ARPGX2
Influenza A by PCR: NEGATIVE
Influenza B by PCR: NEGATIVE
SARS Coronavirus 2 by RT PCR: NEGATIVE

## 2020-08-23 LAB — LACTIC ACID, PLASMA
Lactic Acid, Venous: 1.6 mmol/L (ref 0.5–1.9)
Lactic Acid, Venous: 1.3 mmol/L (ref 0.5–1.9)

## 2020-08-23 LAB — APTT: aPTT: 27 seconds (ref 24–36)

## 2020-08-23 LAB — PROTIME-INR
INR: 1 (ref 0.8–1.2)
Prothrombin Time: 12.8 seconds (ref 11.4–15.2)

## 2020-08-23 LAB — LIPASE, BLOOD: Lipase: 28 U/L (ref 11–51)

## 2020-08-23 LAB — I-STAT BETA HCG BLOOD, ED (MC, WL, AP ONLY): I-stat hCG, quantitative: 5.8 m[IU]/mL — ABNORMAL HIGH (ref <5)

## 2020-08-23 MED ORDER — PROCHLORPERAZINE EDISYLATE 10 MG/2ML IJ SOLN
10.0000 mg | Freq: Four times a day (QID) | INTRAMUSCULAR | Status: DC | PRN
  Administered 2020-08-24 – 2020-08-27 (×3): 10 mg via INTRAVENOUS
  Filled 2020-08-23 (×3): qty 2

## 2020-08-23 MED ORDER — PIPERACILLIN-TAZOBACTAM 3.375 G IVPB
3.3750 g | Freq: Three times a day (TID) | INTRAVENOUS | Status: DC
  Administered 2020-08-23 – 2020-08-25 (×5): 3.375 g via INTRAVENOUS
  Filled 2020-08-23 (×6): qty 50

## 2020-08-23 MED ORDER — GUAIFENESIN-DM 100-10 MG/5ML PO SYRP
10.0000 mL | ORAL_SOLUTION | Freq: Four times a day (QID) | ORAL | Status: DC | PRN
  Administered 2020-08-23 – 2020-08-24 (×2): 10 mL via ORAL
  Filled 2020-08-23 (×2): qty 10

## 2020-08-23 MED ORDER — PANTOPRAZOLE SODIUM 40 MG PO TBEC
40.0000 mg | DELAYED_RELEASE_TABLET | Freq: Every day | ORAL | Status: DC
  Administered 2020-08-23 – 2020-08-24 (×2): 40 mg via ORAL
  Filled 2020-08-23 (×3): qty 1

## 2020-08-23 MED ORDER — PIPERACILLIN-TAZOBACTAM 3.375 G IVPB 30 MIN: 3.3750 g | Freq: Once | INTRAVENOUS | Status: DC

## 2020-08-23 MED ORDER — ACETAMINOPHEN 325 MG PO TABS
650.0000 mg | ORAL_TABLET | Freq: Once | ORAL | Status: AC
  Administered 2020-08-23: 650 mg via ORAL
  Filled 2020-08-23: qty 2

## 2020-08-23 MED ORDER — LACTATED RINGERS IV BOLUS (SEPSIS)
1000.0000 mL | Freq: Once | INTRAVENOUS | Status: AC
  Administered 2020-08-23: 1000 mL via INTRAVENOUS

## 2020-08-23 MED ORDER — HYDROMORPHONE HCL 1 MG/ML IJ SOLN
0.5000 mg | INTRAMUSCULAR | Status: AC | PRN
Start: 2020-08-23 — End: 2020-08-24
  Administered 2020-08-23 – 2020-08-24 (×3): 0.5 mg via INTRAVENOUS
  Filled 2020-08-23 (×3): qty 0.5

## 2020-08-23 MED ORDER — ONDANSETRON HCL 4 MG/2ML IJ SOLN
4.0000 mg | Freq: Once | INTRAMUSCULAR | Status: AC
  Administered 2020-08-23: 4 mg via INTRAVENOUS
  Filled 2020-08-23: qty 2

## 2020-08-23 MED ORDER — ACETAMINOPHEN 650 MG RE SUPP: 650.0000 mg | Freq: Four times a day (QID) | RECTAL | Status: DC | PRN

## 2020-08-23 MED ORDER — DEXTROMETHORPHAN-GUAIFENESIN 10-100 MG/5ML PO LIQD
10.0000 mL | Freq: Four times a day (QID) | ORAL | Status: DC | PRN
  Filled 2020-08-23 (×2): qty 10

## 2020-08-23 MED ORDER — HYDROCODONE-ACETAMINOPHEN 7.5-325 MG PO TABS
1.0000 | ORAL_TABLET | Freq: Four times a day (QID) | ORAL | Status: DC | PRN
  Administered 2020-08-26: 1 via ORAL
  Filled 2020-08-23: qty 1

## 2020-08-23 MED ORDER — PIPERACILLIN-TAZOBACTAM 3.375 G IVPB 30 MIN
3.3750 g | Freq: Once | INTRAVENOUS | Status: AC
  Administered 2020-08-23: 3.375 g via INTRAVENOUS
  Filled 2020-08-23: qty 50

## 2020-08-23 MED ORDER — MORPHINE SULFATE (PF) 2 MG/ML IV SOLN
2.0000 mg | INTRAVENOUS | Status: DC | PRN
Start: 2020-08-23 — End: 2020-08-23
  Administered 2020-08-23: 2 mg via INTRAVENOUS
  Filled 2020-08-23: qty 1

## 2020-08-23 MED ORDER — ACETAMINOPHEN 325 MG PO TABS
650.0000 mg | ORAL_TABLET | Freq: Four times a day (QID) | ORAL | Status: DC | PRN
  Administered 2020-08-23 – 2020-08-25 (×5): 650 mg via ORAL
  Filled 2020-08-23 (×6): qty 2

## 2020-08-23 MED ORDER — MORPHINE SULFATE (PF) 4 MG/ML IV SOLN
4.0000 mg | Freq: Once | INTRAVENOUS | Status: AC
  Administered 2020-08-23: 4 mg via INTRAVENOUS
  Filled 2020-08-23: qty 1

## 2020-08-23 MED ORDER — SODIUM CHLORIDE 0.9 % IV SOLN: Freq: Once | INTRAVENOUS | Status: AC

## 2020-08-23 NOTE — Progress Notes (Signed)
Pharmacy Antibiotic Note  Sherry Carrillo is a 55 y.o. female admitted on 08/23/2020 with febrile neutropenia.  Pharmacy has been consulted for Zosyn dosing.  Zosyn 3.375 g IV x 1 ordered in ED but not administered  Plan: Zosyn 3.375 g IV x 1 over 30 minutes now Zosyn 3.375g IV q8h (4 hour infusion).  Monitor clinical picture, renal function F/U C&S, abx deescalation / LOT   Height: 5' (152.4 cm) Weight: 72.6 kg (160 lb) IBW/kg (Calculated) : 45.5  Temp (24hrs), Avg:99.5 F (37.5 C), Min:98.4 F (36.9 C), Max:101.2 F (38.4 C)  Recent Labs  Lab 08/18/20 0802 08/23/20 1007 08/23/20 1124 08/23/20 1355  WBC 14.2* 1.9*  --  1.5*  CREATININE 0.72 0.78  --   --   LATICACIDVEN  --   --  1.6 1.3    Estimated Creatinine Clearance: 70.6 mL/min (by C-G formula based on SCr of 0.78 mg/dL).    Allergies  Allergen Reactions  . Sulfa Antibiotics Nausea Only    Antimicrobials this admission: 5/31 Zosyn >>   Dose adjustments this admission:   Microbiology results: 5/31 BCx: sent 5/31 UCx: sent   Thank you for allowing pharmacy to be a part of this patient's care.  Efraim Kaufmann, PharmD, BCPS 08/23/2020 3:21 PM

## 2020-08-23 NOTE — ED Notes (Signed)
Family at bedside. 

## 2020-08-23 NOTE — H&P (Signed)
History and Physical    TING CAGE WGN:[id number] DOB: 02/03/1966 DOA: 08/23/2020  PCP: Vivi Barrack, MD  Patient coming from: Home  Chief Complaint: abdominal pain  HPI: Sherry Carrillo is a 55 y.o. female with medical history significant of HTN, anxiety, breast CA on chemo. Presenting with abdominal pain. Reports that she had a session w/ chemo 5 days ago. She was doing well until yesterday. She noted umbilical abdominal pain. It was sharp and constant. 9/10 pain. She tried a heating pad. This helped some, but did not give complete relief. She denies any N/V or changes to stool. The pain continued through this morning. She became concerned and came to the ED.    ED Course: She was found to be febrile and neutropenic. She was started on zosyn. TRH was called for admission.   Review of Systems:  Denies CP, dyspnea, palpitations, N/V/D/constipaiton, urinary changes, sick contacts. Review of systems is otherwise negative for all not mentioned in HPI.   PMHx Past Medical History:  Diagnosis Date  . Abnormal pap 2009   PT HAD SURGERY BUT DON'T KNOW WHAT TYPE  . Breast cancer ([place]) 04/27/2020  . Headache(784.0)    otc meds prn  . Hypertension   . Hyperthyroidism   . SVD (spontaneous vaginal delivery)    x 2    PSHx Past Surgical History:  Procedure Laterality Date  . BREAST LUMPECTOMY WITH RADIOACTIVE SEED AND SENTINEL LYMPH NODE BIOPSY Left 06/03/2020   Procedure: RADIOACTIVE SEED GUIDED LEFT BREAST LUMPECTOMY, LEFT AXILLARY SENTINEL LYMPH NODE BIOPSY;  Surgeon: [name], MD;  Location: [facility];  Service: General;  Laterality: Left;  . FOOT SURGERY     left  . PORTACATH PLACEMENT Left 07/27/2020   Procedure: INSERTION PORT-A-CATH;  Surgeon: [name], MD;  Location: [facility];  Service: General;  Laterality: Left;  . RE-EXCISION OF BREAST LUMPECTOMY Left 07/27/2020   Procedure: RE-EXCISION OF LEFT BREAST LUMPECTOMY;  Surgeon: [name], MD;  Location: [facility];  Service: General;   Laterality: Left;  . TUBAL LIGATION    . WISDOM TOOTH EXTRACTION      SocHx  reports that she has never smoked. She has never used smokeless tobacco. She reports that she does not drink alcohol and does not use drugs.  Allergies  Allergen Reactions  . Sulfa Antibiotics Nausea Only    FamHx Family History  Problem Relation Age of Onset  . Cancer Mother        LUNG  . Hypertension Mother   . Diabetes Mother   . Heart disease Father   . Diabetes Maternal Grandmother   . Heart disease Paternal Grandmother   . Heart disease Paternal Grandfather   . Thyroid disease Sister   . Diabetes Brother   . Cancer Maternal Aunt 6       breast cancer     Prior to Admission medications   Medication Sig Start Date End Date Taking? Authorizing Provider  dexamethasone (DECADRON) 4 MG tablet Take 1 tablet (4 mg total) by mouth 2 (two) times daily. Start the day before Taxotere. Then again 1 tab daily the day after chemo for 3 days. 08/10/20   Truitt Merle, MD  lidocaine-prilocaine (EMLA) cream Apply to affected area as needed 08/12/20   Truitt Merle, MD  lisinopril-hydrochlorothiazide (ZESTORETIC) 20-12.5 MG tablet Take 1 tablet by mouth once daily 07/04/20   Vivi Barrack, MD  ondansetron (ZOFRAN) 8 MG tablet Take 1 tablet (8 mg total) by mouth 2 (  two) times daily as needed for refractory nausea / vomiting. Start on day 3 after chemo. 08/10/20   Truitt Merle, MD  prochlorperazine (COMPAZINE) 10 MG tablet Take 1 tablet (10 mg total) by mouth every 6 (six) hours as needed (Nausea or vomiting). 08/10/20   Truitt Merle, MD  venlafaxine (EFFEXOR) 37.5 MG tablet Take 1 tablet (37.5 mg total) by mouth 2 (two) times daily. Patient not taking: Reported on 07/19/2020 06/01/20   Truitt Merle, MD    Physical Exam: Vitals:   08/23/20 1001 08/23/20 1036 08/23/20 1141 08/23/20 1303  BP: 106/71 115/75 102/65 106/64  Pulse: (!) 116 (!) 112 (!) 103 92  Resp: 16 (!) 25 (!) 21 16  Temp: 99 F (37.2 C) (!) 101.2 F (38.4 C)  99.8  F (37.7 C)  TempSrc: Oral Oral  Oral  SpO2: 99% 100% 100% 98%  Weight: 72.6 kg     Height: 5' (1.524 m)       General: 55 y.o. female resting in bed in NAD Eyes: PERRL, normal sclera ENMT: Nares patent w/o discharge, orophaynx clear, dentition normal, ears w/o discharge/lesions/ulcers Neck: Supple, trachea midline Cardiovascular: RRR, +S1, S2, no m/g/r, equal pulses throughout Respiratory: CTABL, no w/r/r, normal WOB GI: BS+, ND, TTP epigastric and umbilical regions, no masses noted, no organomegaly noted MSK: No e/c/c Skin: No rashes, bruises, ulcerations noted Neuro: A&O x 3, no focal deficits Psyc: Appropriate interaction and affect, calm/cooperative  Labs on Admission: I have personally reviewed following labs and imaging studies  CBC: Recent Labs  Lab 08/18/20 0802 08/23/20 1007  WBC 14.2* 1.9*  NEUTROABS 11.8*  --   HGB 13.0 14.4  HCT 38.9 45.0  MCV 83.3 86.9  PLT 298 938*   Basic Metabolic Panel: Recent Labs  Lab 08/18/20 0802 08/23/20 1007  NA 140 134*  K 3.8 4.5  CL 104 99  CO2 24 27  GLUCOSE 140* 111*  BUN 16 15  CREATININE 0.72 0.78  CALCIUM 9.8 9.3   GFR: Estimated Creatinine Clearance: 70.6 mL/min (by C-G formula based on SCr of 0.78 mg/dL). Liver Function Tests: Recent Labs  Lab 08/18/20 0802 08/23/20 1007  AST 20 29  ALT 31 39  ALKPHOS 86 96  BILITOT 0.2* 0.4  PROT 7.3 7.6  ALBUMIN 3.7 3.9   Recent Labs  Lab 08/23/20 1007  LIPASE 28   No results for input(s): AMMONIA in the last 168 hours. Coagulation Profile: Recent Labs  Lab 08/23/20 1043  INR 1.0   Cardiac Enzymes: No results for input(s): CKTOTAL, CKMB, CKMBINDEX, TROPONINI in the last 168 hours. BNP (last 3 results) No results for input(s): PROBNP in the last 8760 hours. HbA1C: No results for input(s): HGBA1C in the last 72 hours. CBG: No results for input(s): GLUCAP in the last 168 hours. Lipid Profile: No results for input(s): CHOL, HDL, LDLCALC, TRIG, CHOLHDL,  LDLDIRECT in the last 72 hours. Thyroid Function Tests: No results for input(s): TSH, T4TOTAL, FREET4, T3FREE, THYROIDAB in the last 72 hours. Anemia Panel: No results for input(s): VITAMINB12, FOLATE, FERRITIN, TIBC, IRON, RETICCTPCT in the last 72 hours. Urine analysis:    Component Value Date/Time   COLORURINE YELLOW 08/23/2020 1124   APPEARANCEUR HAZY (A) 08/23/2020 1124   LABSPEC 1.024 08/23/2020 1124   PHURINE 6.0 08/23/2020 1124   GLUCOSEU NEGATIVE 08/23/2020 1124   [place] 08/23/2020 1124   [place] 08/23/2020 1124   [place] 08/23/2020 1124   PROTEINUR NEGATIVE 08/23/2020 1124   UROBILINOGEN 0.2 02/04/2007  [place] 08/23/2020 1124   [place] (A) 08/23/2020 1124    Radiological Exams on Admission: DG Chest Port 1 View  Result Date: 08/23/2020 CLINICAL DATA:  Question sepsis EXAM: PORTABLE CHEST 1 VIEW COMPARISON:  05/15/2019 FINDINGS: Mild hypoventilation with mild bibasilar atelectasis. No definite pneumonia or effusion. Negative for heart failure. Interval placement of left jugular Port-A-Cath with the tip in the SVC. IMPRESSION: Hypoventilation with mild bibasilar atelectasis. Electronically Signed   By: Franchot Gallo M.D.   On: 08/23/2020 12:18    EKG: None obtained in ED  Assessment/Plan Abdominal pain     - admitted to inpt, tele     - lipase normal, liver numbers are normal     - check CT ab/pelvis     - add protonix, pain control; she reports a previous history of GERD secondary to excessive NSAID use; denies current NSAID use  Neutropenic fever     - ANC is 1000     - started on zosyn in ED; continue for now     - no source as yet identified     - follow bld cx and Ucx     - Onco (Dr. Burr Medico) notified, she will follow the patient, appreciate assistance  Mild hyponatremia     - fluids, follow  Hx of breast CA on chemotherapy     - Onco (Dr. Burr Medico) notified, she will follow the patient, appreciate  assistance  HTN     - resume home regimen once confirmed and as BP tolerates  Anxiety     - resume home regimen once confirmed  DVT prophylaxis: SCDs  Code Status: FULL  Family Communication: w/ husband at bedside  Consults called: Onco   Status is: Inpatient  Remains inpatient appropriate because:Inpatient level of care appropriate due to severity of illness   Dispo: The patient is from: Home              Anticipated d/c is to: Home              Patient currently is not medically stable to d/c.   Difficult to place patient No  Time spent coordinating admission: 70 minutes  [facility]  If 7PM-7AM, please contact night-coverage [email or web address]  08/23/2020, 1:22 PM

## 2020-08-23 NOTE — ED Notes (Signed)
Patient transported to CT 

## 2020-08-23 NOTE — ED Provider Notes (Signed)
[facility] DEPT Provider Note   CSN: [id number] Arrival date & time: 08/23/20  3419     History Chief Complaint  Patient presents with  . Abdominal Pain  . Back Pain  . Headache    Sherry Carrillo is a 55 y.o. female who presents with concern for headache, abdominal pain, and back pain since yesterday morning.  Patient recently completed her first chemotherapy treatment for stage IIa breast cancer, first chemo treatment 5 days ago.  Patient follows with Dr. Annamaria Boots for stage IIa left breast cancer status post lumpectomy now undergoing chemotherapy with docetaxel and Cytoxan.  Patient has been vaccinating with COVID-19, denies any nausea or vomiting but does endorse cough and fever, denies any shortness of breath but does endorse chills.  Patient is tearful and in acute distress secondary to pain to my initial exam.  I personally reviewed this patient's medical records.  Patient with history of hypertension and hyperthyroidism.  HPI     Past Medical History:  Diagnosis Date  . Abnormal pap 2009   PT HAD SURGERY BUT DON'T KNOW WHAT TYPE  . Breast cancer ([place]) 04/27/2020  . Headache(784.0)    otc meds prn  . Hypertension   . Hyperthyroidism   . SVD (spontaneous vaginal delivery)    x 2    Patient Active Problem List   Diagnosis Date Noted  . Febrile neutropenia ([place]) 08/23/2020  . Port-A-Cath in place 08/18/2020  . Malignant neoplasm of upper-outer quadrant of left breast, estrogen receptor positive ([place]) 05/31/2020  . Hyperthyroidism 05/16/2020  . GERD (gastroesophageal reflux disease) 04/03/2019  . Menopause 01/06/2018  . Dyslipidemia 09/05/2017  . Hyperglycemia 09/05/2017  . Abdominal pain, bilateral upper quadrant 09/03/2017  . Pain of finger of left hand 04/16/2017  . Headache disorder 09/04/2016  . Other fatigue 12/19/2015  . Obesity 10/05/2015  . Left shoulder pain 03/07/2015  . Essential hypertension 03/07/2015    Past Surgical  History:  Procedure Laterality Date  . BREAST LUMPECTOMY WITH RADIOACTIVE SEED AND SENTINEL LYMPH NODE BIOPSY Left 06/03/2020   Procedure: RADIOACTIVE SEED GUIDED LEFT BREAST LUMPECTOMY, LEFT AXILLARY SENTINEL LYMPH NODE BIOPSY;  Surgeon: [name], MD;  Location: [facility];  Service: General;  Laterality: Left;  . FOOT SURGERY     left  . PORTACATH PLACEMENT Left 07/27/2020   Procedure: INSERTION PORT-A-CATH;  Surgeon: [name], MD;  Location: [facility];  Service: General;  Laterality: Left;  . RE-EXCISION OF BREAST LUMPECTOMY Left 07/27/2020   Procedure: RE-EXCISION OF LEFT BREAST LUMPECTOMY;  Surgeon: [name], MD;  Location: [facility];  Service: General;  Laterality: Left;  . TUBAL LIGATION    . WISDOM TOOTH EXTRACTION       OB History    Gravida  2   Para      Term      Preterm      AB      Living  2     SAB      IAB      Ectopic      Multiple      Live Births              Family History  Problem Relation Age of Onset  . Cancer Mother        LUNG  . Hypertension Mother   . Diabetes Mother   . Heart disease Father   . Diabetes Maternal Grandmother   . Heart disease Paternal Grandmother   . Heart disease  Paternal Grandfather   . Thyroid disease Sister   . Diabetes Brother   . Cancer Maternal Aunt 34       breast cancer     Social History   Tobacco Use  . Smoking status: Never Smoker  . Smokeless tobacco: Never Used  Vaping Use  . Vaping Use: Never used  Substance Use Topics  . Alcohol use: No  . Drug use: No    Home Medications Prior to Admission medications   Medication Sig Start Date End Date Taking? Authorizing Provider  dexamethasone (DECADRON) 4 MG tablet Take 1 tablet (4 mg total) by mouth 2 (two) times daily. Start the day before Taxotere. Then again 1 tab daily the day after chemo for 3 days. 08/10/20  Yes Truitt Merle, MD  lidocaine-prilocaine (EMLA) cream Apply to affected area as needed Patient taking differently: Apply 1 application  topically as needed (port access). 08/12/20  Yes Truitt Merle, MD  lisinopril-hydrochlorothiazide (ZESTORETIC) 20-12.5 MG tablet Take 1 tablet by mouth once daily Patient taking differently: Take 1 tablet by mouth daily. 07/04/20  Yes Vivi Barrack, MD  ondansetron (ZOFRAN) 8 MG tablet Take 1 tablet (8 mg total) by mouth 2 (two) times daily as needed for refractory nausea / vomiting. Start on day 3 after chemo. 08/10/20  Yes Truitt Merle, MD  prochlorperazine (COMPAZINE) 10 MG tablet Take 1 tablet (10 mg total) by mouth every 6 (six) hours as needed (Nausea or vomiting). 08/10/20  Yes Truitt Merle, MD  venlafaxine (EFFEXOR) 37.5 MG tablet Take 1 tablet (37.5 mg total) by mouth 2 (two) times daily. Patient not taking: No sig reported 06/01/20   Truitt Merle, MD    Allergies    Sulfa antibiotics  Review of Systems   Review of Systems  Constitutional: Positive for activity change, appetite change, chills, fatigue and fever.  HENT: Negative for congestion, sore throat, trouble swallowing and voice change.   Eyes: Negative.  Negative for photophobia, redness and visual disturbance.  Respiratory: Positive for cough. Negative for apnea and shortness of breath.   Cardiovascular: Negative for chest pain, palpitations and leg swelling.  Gastrointestinal: Positive for abdominal pain. Negative for blood in stool, constipation, diarrhea, nausea and vomiting.  Genitourinary: Negative.   Musculoskeletal: Positive for arthralgias and myalgias.  Skin: Negative.   Allergic/Immunologic: Positive for immunocompromised state.  Neurological: Positive for light-headedness and headaches. Negative for dizziness.    Physical Exam Updated Vital Signs BP 102/69 (BP Location: Right Arm)   Pulse 95   Temp 98.9 F (37.2 C) (Oral)   Resp 16   Ht 5' (1.524 m)   Wt 72.6 kg   LMP 11/12/2011 Comment: pt denies preg  SpO2 96%   BMI 31.25 kg/m   Physical Exam Vitals and nursing note reviewed.  Constitutional:      General:  She is in acute distress.     Appearance: She is obese. She is ill-appearing. She is not toxic-appearing.     Comments: Distress secondary to back pain  HENT:     Head: Normocephalic and atraumatic.     Nose: Nose normal.     Mouth/Throat:     Mouth: Mucous membranes are moist.     Pharynx: Oropharynx is clear. Uvula midline. No oropharyngeal exudate, posterior oropharyngeal erythema or uvula swelling.     Tonsils: No tonsillar exudate.  Eyes:     General: Lids are normal. Vision grossly intact.        Right eye: No discharge.  Left eye: No discharge.     Extraocular Movements: Extraocular movements intact.     Conjunctiva/sclera: Conjunctivae normal.     Pupils: Pupils are equal, round, and reactive to light.  Neck:     Trachea: Trachea and phonation normal.  Cardiovascular:     Rate and Rhythm: Normal rate and regular rhythm.     Pulses: Normal pulses.     Heart sounds: Normal heart sounds.  Pulmonary:     Effort: Pulmonary effort is normal. No respiratory distress.     Breath sounds: Examination of the right-lower field reveals decreased breath sounds. Examination of the left-lower field reveals decreased breath sounds. Decreased breath sounds present. No wheezing or rales.  Chest:     Chest wall: No mass, lacerations, deformity, swelling, tenderness, crepitus or edema.  Abdominal:     General: Bowel sounds are normal. There is no distension.     Palpations: Abdomen is soft.     Tenderness: There is abdominal tenderness in the epigastric area. There is no right CVA tenderness, left CVA tenderness, guarding or rebound.  Musculoskeletal:        General: No deformity.     Cervical back: Normal, normal range of motion and neck supple. No edema, rigidity, bony tenderness or crepitus. No pain with movement or spinous process tenderness.     Thoracic back: Spasms and tenderness present. No bony tenderness.     Lumbar back: Normal.       Back:     Right lower leg: No edema.      Left lower leg: No edema.  Lymphadenopathy:     Cervical: No cervical adenopathy.  Skin:    General: Skin is warm and dry.  Neurological:     General: No focal deficit present.     Mental Status: She is alert and oriented to person, place, and time. Mental status is at baseline.  Psychiatric:        Mood and Affect: Mood normal.     ED Results / Procedures / Treatments   Labs (all labs ordered are listed, but only abnormal results are displayed) Labs Reviewed  COMPREHENSIVE METABOLIC PANEL - Abnormal; Notable for the following components:      Result Value   Sodium 134 (*)    Glucose, Bld 111 (*)    All other components within normal limits  CBC - Abnormal; Notable for the following components:   WBC 1.9 (*)    RBC 5.18 (*)    All other components within normal limits  URINALYSIS, ROUTINE W REFLEX MICROSCOPIC - Abnormal; Notable for the following components:   APPearance HAZY (*)    Leukocytes,Ua LARGE (*)    WBC, UA >50 (*)    Bacteria, UA RARE (*)    Non Squamous Epithelial 0-5 (*)    All other components within normal limits  DIFFERENTIAL - Abnormal; Notable for the following components:   Neutro Abs 1.0 (*)    Lymphs Abs 0.6 (*)    All other components within normal limits  CBC - Abnormal; Notable for the following components:   WBC 1.5 (*)    Platelets 145 (*)    All other components within normal limits  CMP (CANCER CENTER ONLY) - Abnormal; Notable for the following components:   Glucose, Bld 103 (*)    All other components within normal limits  I-STAT BETA HCG BLOOD, ED ([facility], [facility], [facility] ONLY) - Abnormal; Notable for the following components:   I-stat hCG, quantitative 5.8 (*)  All other components within normal limits  RESP PANEL BY RT-PCR (FLU A&B, COVID) ARPGX2  URINE CULTURE  CULTURE, BLOOD (ROUTINE X 2)  CULTURE, BLOOD (ROUTINE X 2)  CULTURE, BLOOD (ROUTINE X 2)  CULTURE, BLOOD (ROUTINE X 2)  LIPASE, BLOOD  LACTIC ACID, PLASMA  LACTIC ACID, PLASMA   APTT  PROTIME-INR  HIV ANTIBODY (ROUTINE TESTING W REFLEX)  I-STAT BETA HCG BLOOD, ED ([facility], [facility], [facility] ONLY)    EKG None  Radiology CT ABDOMEN PELVIS WO CONTRAST  Result Date: 08/23/2020 CLINICAL DATA:  Abdominal pain, fever. EXAM: CT ABDOMEN AND PELVIS WITHOUT CONTRAST TECHNIQUE: Multidetector CT imaging of the abdomen and pelvis was performed following the standard protocol without IV contrast. COMPARISON:  July 20, 2020. FINDINGS: Lower chest: No acute abnormality. Hepatobiliary: No focal liver abnormality is seen. No gallstones, gallbladder wall thickening, or biliary dilatation. Pancreas: Unremarkable. No pancreatic ductal dilatation or surrounding inflammatory changes. Spleen: Normal in size without focal abnormality. Adrenals/Urinary Tract: Adrenal glands are unremarkable. Kidneys are normal, without renal calculi, focal lesion, or hydronephrosis. Bladder is unremarkable. Stomach/Bowel: Stomach is within normal limits. Appendix appears normal. No evidence of bowel wall thickening, distention, or inflammatory changes. Vascular/Lymphatic: No significant vascular findings are present. No enlarged abdominal or pelvic lymph nodes. Reproductive: Uterus and bilateral adnexa are unremarkable. Other: No abdominal wall hernia or abnormality. No abdominopelvic ascites. Musculoskeletal: No acute or significant osseous findings. IMPRESSION: No acute abnormality seen in the abdomen pelvis. Electronically Signed   By: Marijo Conception M.D.   On: 08/23/2020 14:53   DG Chest Port 1 View  Result Date: 08/23/2020 CLINICAL DATA:  Question sepsis EXAM: PORTABLE CHEST 1 VIEW COMPARISON:  05/15/2019 FINDINGS: Mild hypoventilation with mild bibasilar atelectasis. No definite pneumonia or effusion. Negative for heart failure. Interval placement of left jugular Port-A-Cath with the tip in the SVC. IMPRESSION: Hypoventilation with mild bibasilar atelectasis. Electronically Signed   By: Franchot Gallo M.D.   On: 08/23/2020  12:18    Procedures Procedures  Medications Ordered in ED Medications  acetaminophen (TYLENOL) tablet 650 mg (has no administration in time range)    Or  acetaminophen (TYLENOL) suppository 650 mg (has no administration in time range)  morphine 2 MG/ML injection 2 mg (2 mg Intravenous Given 08/23/20 1632)  prochlorperazine (COMPAZINE) injection 10 mg (has no administration in time range)  pantoprazole (PROTONIX) EC tablet 40 mg (40 mg Oral Given 08/23/20 1545)  piperacillin-tazobactam (ZOSYN) IVPB 3.375 g (has no administration in time range)  dextromethorphan-guaiFENesin (ROBITUSSIN-DM) 10-100 MG/5ML liquid 10 mL (has no administration in time range)  lactated ringers bolus 1,000 mL (0 mLs Intravenous Stopped 08/23/20 1325)  morphine 4 MG/ML injection 4 mg (4 mg Intravenous Given 08/23/20 1120)  ondansetron (ZOFRAN) injection 4 mg (4 mg Intravenous Given 08/23/20 1119)  acetaminophen (TYLENOL) tablet 650 mg (650 mg Oral Given 08/23/20 1109)  0.9 %  sodium chloride infusion ( Intravenous New Bag/Given 08/23/20 1543)  piperacillin-tazobactam (ZOSYN) IVPB 3.375 g (3.375 g Intravenous New Bag/Given 08/23/20 1543)    ED Course  I have reviewed the triage vital signs and the nursing notes.  Pertinent labs & imaging results that were available during my care of the patient were reviewed by me and considered in my medical decision making (see chart for details).  Clinical Course as of 08/23/20 1656  Tue Aug 23, 2020  1247 Consult called to hospitalist, Dr. Marylyn Ishihara, was agreeable to seeing this patient and admitting her to his service.  Appreciate his collaboration in  the care of this patient. [RS]    Clinical Course User Index [RS] Cyd Hostler, Sharlene Dory   MDM Rules/Calculators/A&P                         55 year old female presents with concern for generalized back pain and abdominal pain as well as chills for the last few days, recently underwent first chemotherapy  treatment.  Differential diagnosis includes resolving to sepsis, febrile neutropenia, COVID-19, pneumonia, peptic ulcer disease, gastritis, diverticulitis or appendicitis.  Tachycardic on intake and febrile to 101.2 F, vital signs otherwise normal.  Cardiopulmonary exam is normal, abdominal exam with mild epigastric tenderness palpation, otherwise unremarkable.  Left-sided thoracic paraspinous musculature and spasm and tender to palpation.  Neurovascular intact all 4 extremities.  CBC with neutropenia with white cell count of 1.5, neutrophils only 1.  CMP unremarkable UA unremarkable, coag studies normal .  Chest x-ray with hypoventilation with mild bibasilar atelectasis, but without definitive signs of for infection.  CT abdomen pelvis without acute abnormality in the abdomen or pelvis.  Patient reevaluated and feeling significantly improved after administration of IV analgesia.  Patient started on IV antibiotics for febrile neutropenia and consult placed to hospitalist for admission.  Please voiced understanding of her medical evaluation and treatment plan.  Each of her questions was answered to her expressed inspection.  She is amenable plan for admission at this time.  This chart was dictated using voice recognition software, Dragon. Despite the best efforts of this provider to proofread and correct errors, errors may still occur which can change documentation meaning.  Final Clinical Impression(s) / ED Diagnoses Final diagnoses:  Malignant neoplasm of upper-outer quadrant of left breast in female, estrogen receptor positive ([place])  Febrile neutropenia [facility])    Rx / DC Orders ED Discharge Orders    None       Aura Dials 08/23/20 1701    Lacretia Leigh, MD 08/26/20 [phone]

## 2020-08-23 NOTE — ED Triage Notes (Signed)
Pt complains of headache, abdominal pain, back pain since yesterday morning. She had chemo treatment 5 days ago. She took tylenol for pain this morning without relief.

## 2020-08-23 NOTE — Progress Notes (Signed)
This patient presented to clinic to day requesting a walk in appointment.  Patient complains of stomach pains that increase with eating, pain to lower back and head, weakness, cough and loose stool x 1.  Patient was advised to go to ED for evaluation and treatment.  Assisted by family member.

## 2020-08-24 DIAGNOSIS — R945 Abnormal results of liver function studies: Secondary | ICD-10-CM

## 2020-08-24 DIAGNOSIS — D696 Thrombocytopenia, unspecified: Secondary | ICD-10-CM

## 2020-08-24 LAB — URINE CULTURE

## 2020-08-24 LAB — CBC
HCT: 37.3 % (ref 36.0–46.0)
Hemoglobin: 12 g/dL (ref 12.0–15.0)
MCH: 28 pg (ref 26.0–34.0)
MCHC: 32.2 g/dL (ref 30.0–36.0)
MCV: 87.1 fL (ref 80.0–100.0)
Platelets: 153 10*3/uL (ref 150–400)
RBC: 4.28 MIL/uL (ref 3.87–5.11)
RDW: 12.6 % (ref 11.5–15.5)
WBC: 2.2 10*3/uL — ABNORMAL LOW (ref 4.0–10.5)
nRBC: 0 % (ref 0.0–0.2)

## 2020-08-24 LAB — CBC WITH DIFFERENTIAL/PLATELET

## 2020-08-24 LAB — DIFFERENTIAL
Abs Immature Granulocytes: 0.01 10*3/uL (ref 0.00–0.07)
Basophils Absolute: 0 10*3/uL (ref 0.0–0.1)
Basophils Relative: 1 %
Eosinophils Absolute: 0.1 10*3/uL (ref 0.0–0.5)
Eosinophils Relative: 4 %
Immature Granulocytes: 0 %
Lymphocytes Relative: 50 %
Lymphs Abs: 1.1 10*3/uL (ref 0.7–4.0)
Monocytes Absolute: 0.7 10*3/uL (ref 0.1–1.0)
Monocytes Relative: 29 %
Neutro Abs: 0.4 10*3/uL — CL (ref 1.7–7.7)
Neutrophils Relative %: 16 %

## 2020-08-24 LAB — COMPREHENSIVE METABOLIC PANEL
ALT: 76 U/L — ABNORMAL HIGH (ref 0–44)
AST: 69 U/L — ABNORMAL HIGH (ref 15–41)
Albumin: 3.4 g/dL — ABNORMAL LOW (ref 3.5–5.0)
Alkaline Phosphatase: 82 U/L (ref 38–126)
Anion gap: 5 (ref 5–15)
BUN: 16 mg/dL (ref 6–20)
CO2: 28 mmol/L (ref 22–32)
Calcium: 8.6 mg/dL — ABNORMAL LOW (ref 8.9–10.3)
Chloride: 102 mmol/L (ref 98–111)
Creatinine, Ser: 0.89 mg/dL (ref 0.44–1.00)
GFR, Estimated: >60 mL/min (ref >60)
Glucose, Bld: 121 mg/dL — ABNORMAL HIGH (ref 70–99)
Potassium: 4.6 mmol/L (ref 3.5–5.1)
Sodium: 135 mmol/L (ref 135–145)
Total Bilirubin: 0.5 mg/dL (ref 0.3–1.2)
Total Protein: 6.6 g/dL (ref 6.5–8.1)

## 2020-08-24 LAB — HIV ANTIBODY (ROUTINE TESTING W REFLEX): HIV Screen 4th Generation wRfx: NONREACTIVE

## 2020-08-24 MED ORDER — HYDROMORPHONE HCL 1 MG/ML IJ SOLN
0.5000 mg | INTRAMUSCULAR | Status: AC | PRN
Start: 2020-08-24 — End: 2020-08-26
  Administered 2020-08-24 – 2020-08-26 (×5): 0.5 mg via INTRAVENOUS
  Filled 2020-08-24 (×5): qty 0.5

## 2020-08-24 NOTE — Progress Notes (Signed)
   08/23/20 2034 08/23/20 2210 08/24/20 0014  Assess: MEWS Score  Temp (!) 101.2 F (38.4 C) 100 F (37.8 C) 99.2 F (37.3 C)  BP 103/72 (!) 94/59 (!) 94/47  Pulse Rate (!) 105 (!) 109 97  Resp 18 18 18   Level of Consciousness Alert Alert  --   SpO2 93 % 95 % 95 %  O2 Device Room Air Room Air Room Air  Assess: MEWS Score  MEWS Temp 1 0 0  MEWS Systolic 0 1 1  MEWS Pulse 1 1 0  MEWS RR 0 0 0  MEWS LOC 0 0 0  MEWS Score 2 2 1   MEWS Score Color Yellow Yellow Green  Assess: if the MEWS score is Yellow or Red  Were vital signs taken at a resting state? Yes  --   --   Focused Assessment No change from prior assessment  --   --   Does the patient meet 2 or more of the SIRS criteria? Yes  --   --   Does the patient have a confirmed or suspected source of infection? Yes  --   --   Provider and Rapid Response Notified? No  --   --   MEWS guidelines implemented *See Row Information* No, previously yellow, continue vital signs every 4 hours  --   --   Treat  MEWS Interventions Escalated (See documentation below);Administered prn meds/treatments  --   --   Pain Scale 0-10  --   --   Pain Score 3  --   --   Take Vital Signs  Increase Vital Sign Frequency  Yellow: Q 2hr X 2 then Q 4hr X 2, if remains yellow, continue Q 4hrs  --   --   Escalate  MEWS: Escalate Yellow: discuss with charge nurse/RN and consider discussing with provider and RRT  --   --   Notify: Charge Nurse/RN  Name of Charge Nurse/RN Notified Meredith CN  --   --   Date Charge Nurse/RN Notified 08/23/20  --   --   Time Charge Nurse/RN Notified 2100  --   --   Document  Patient Outcome  --   --  Stabilized after interventions  Progress note created (see row info)  --   --  Yes  Assess: SIRS CRITERIA  SIRS Temperature  1 0 0  SIRS Pulse 1 1 1   SIRS Respirations  0 0 0  SIRS WBC 1 1 0  SIRS Score Sum  3 2 1     Patient admitted with fevers, which were present in ED. After Tylenol PRN administration, patient's temperature  normalized. Patient A&O X4. Will continue to monitor.

## 2020-08-24 NOTE — Plan of Care (Signed)
  Problem: Education: Goal: Knowledge of General Education information will improve Description: Including pain rating scale, medication(s)/side effects and non-pharmacologic comfort measures Outcome: Progressing   Problem: Health Behavior/Discharge Planning: Goal: Ability to manage health-related needs will improve Outcome: Progressing   Problem: Clinical Measurements: Goal: Diagnostic test results will improve Outcome: Progressing Goal: Respiratory complications will improve Outcome: Progressing Goal: Cardiovascular complication will be avoided Outcome: Progressing   Problem: Activity: Goal: Risk for activity intolerance will decrease Outcome: Progressing   Problem: Nutrition: Goal: Adequate nutrition will be maintained Outcome: Progressing   Problem: Coping: Goal: Level of anxiety will decrease Outcome: Progressing   Problem: Elimination: Goal: Will not experience complications related to bowel motility Outcome: Progressing Goal: Will not experience complications related to urinary retention Outcome: Progressing   Problem: Pain Managment: Goal: General experience of comfort will improve Outcome: Progressing   Problem: Safety: Goal: Ability to remain free from injury will improve Outcome: Progressing   Problem: Skin Integrity: Goal: Risk for impaired skin integrity will decrease Outcome: Progressing    Not progressing in some areas due to spiking a fever this evening and being neutropenic.

## 2020-08-24 NOTE — Progress Notes (Signed)
Sherry Carrillo   DOB:Jul 17, 1965   ZM#:[id number]   TKP#:[id number]  Oncology follow up   Subjective: Pt is well-known to me, under my care for her breast cancer.  She started adjuvant chemotherapy last week.  She presented with abdominal pain, and was found to be febrile and neutropenic in the ER.  She was admitted for further management.   Objective:  Vitals:   08/24/20 0014 08/24/20 0455  BP: (!) 94/47 114/65  Pulse: 97 (!) 101  Resp: 18 18  Temp: 99.2 F (37.3 C) 99.4 F (37.4 C)  SpO2: 95% 96%    Body mass index is 31.25 kg/m.  Intake/Output Summary (Last 24 hours) at 08/24/2020 0631 Last data filed at 08/23/2020 1835 Gross per 24 hour  Intake 1120 ml  Output 1 ml  Net 1119 ml     Sclerae unicteric  Oropharynx clear  No peripheral adenopathy  Lungs clear -- no rales or rhonchi  Heart regular rate and rhythm  Abdomen benign  MSK no focal spinal tenderness, no peripheral edema  Neuro nonfocal   CBG (last 3)  No results for input(s): GLUCAP in the last 72 hours.   Labs:   Urine Studies No results for input(s): UHGB, CRYS in the last 72 hours.  Invalid input(s): UACOL, UAPR, USPG, UPH, UTP, UGL, UKET, UBIL, UNIT, UROB, ULEU, UEPI, UWBC, URBC, UBAC, CAST, [place], [place]  Basic Metabolic Panel: Recent Labs  Lab 08/18/20 0802 08/23/20 1007 08/23/20 1456 08/24/20 0519  NA 140 134* 136 135  K 3.8 4.5 4.4 4.6  CL 104 99 102 102  CO2 24 27 26 28   GLUCOSE 140* 111* 103* 121*  BUN 16 15 13 16   CREATININE 0.72 0.78 0.66 0.89  CALCIUM 9.8 9.3 9.0 8.6*   GFR Estimated Creatinine Clearance: 63.5 mL/min (by C-G formula based on SCr of 0.89 mg/dL). Liver Function Tests: Recent Labs  Lab 08/18/20 0802 08/23/20 1007 08/23/20 1456 08/24/20 0519  AST 20 29 22  69*  ALT 31 39 32 76*  ALKPHOS 86 96 81 82  BILITOT 0.2* 0.4 0.9 0.5  PROT 7.3 7.6 6.8 6.6  ALBUMIN 3.7 3.9 3.6 3.4*   Recent Labs  Lab 08/23/20 1007  LIPASE 28   No results for input(s): AMMONIA in the  last 168 hours. Coagulation profile Recent Labs  Lab 08/23/20 1043  INR 1.0    CBC: Recent Labs  Lab 08/18/20 0802 08/23/20 1007 08/23/20 1245 08/23/20 1355 08/24/20 0519  WBC 14.2* 1.9*  --  1.5* 2.2*  NEUTROABS 11.8*  --  1.0*  --   --   HGB 13.0 14.4  --  12.1 12.0  HCT 38.9 45.0  --  37.2 37.3  MCV 83.3 86.9  --  86.3 87.1  PLT 298 PLATELET CLUMPS NOTED ON SMEAR, UNABLE TO ESTIMATE  --  145* 153   Cardiac Enzymes: No results for input(s): CKTOTAL, CKMB, CKMBINDEX, TROPONINI in the last 168 hours. BNP: Invalid input(s): POCBNP CBG: No results for input(s): GLUCAP in the last 168 hours. D-Dimer No results for input(s): DDIMER in the last 72 hours. Hgb A1c No results for input(s): HGBA1C in the last 72 hours. Lipid Profile No results for input(s): CHOL, HDL, LDLCALC, TRIG, CHOLHDL, LDLDIRECT in the last 72 hours. Thyroid function studies No results for input(s): TSH, T4TOTAL, T3FREE, THYROIDAB in the last 72 hours.  Invalid input(s): FREET3 Anemia work up No results for input(s): VITAMINB12, FOLATE, FERRITIN, TIBC, IRON, RETICCTPCT in the last 72 hours. Microbiology Recent  Results (from the past 240 hour(s))  Resp Panel by RT-PCR (Flu A&B, Covid) Nasopharyngeal Swab     Status: None   Collection Time: 08/23/20 10:45 AM   Specimen: Nasopharyngeal Swab; Nasopharyngeal(NP) swabs in vial transport medium  Result Value Ref Range Status   SARS Coronavirus 2 by RT PCR NEGATIVE NEGATIVE Final    Comment: (NOTE) SARS-CoV-2 target nucleic acids are NOT DETECTED.  The SARS-CoV-2 RNA is generally detectable in upper respiratory specimens during the acute phase of infection. The lowest concentration of SARS-CoV-2 viral copies this assay can detect is 138 copies/mL. A negative result does not preclude SARS-Cov-2 infection and should not be used as the sole basis for treatment or other patient management decisions. A negative result may occur with  improper specimen  collection/handling, submission of specimen other than nasopharyngeal swab, presence of viral mutation(s) within the areas targeted by this assay, and inadequate number of viral copies(<138 copies/mL). A negative result must be combined with clinical observations, patient history, and epidemiological information. The expected result is Negative.  Fact Sheet for Patients:  EntrepreneurPulse.com.au  Fact Sheet for Healthcare Providers:  IncredibleEmployment.be  This test is no t yet approved or cleared by the [place] FDA and  has been authorized for detection and/or diagnosis of SARS-CoV-2 by FDA under an Emergency Use Authorization (EUA). This EUA will remain  in effect (meaning this test can be used) for the duration of the COVID-19 declaration under Section 564(b)(1) of the Act, 21 U.S.C.section 360bbb-3(b)(1), unless the authorization is terminated  or revoked sooner.       Influenza A by PCR NEGATIVE NEGATIVE Final   Influenza B by PCR NEGATIVE NEGATIVE Final    Comment: (NOTE) The Xpert Xpress SARS-CoV-2/FLU/RSV plus assay is intended as an aid in the diagnosis of influenza from Nasopharyngeal swab specimens and should not be used as a sole basis for treatment. Nasal washings and aspirates are unacceptable for Xpert Xpress SARS-CoV-2/FLU/RSV testing.  Fact Sheet for Patients: EntrepreneurPulse.com.au  Fact Sheet for Healthcare Providers: IncredibleEmployment.be  This test is not yet approved or cleared by the [place] FDA and has been authorized for detection and/or diagnosis of SARS-CoV-2 by FDA under an Emergency Use Authorization (EUA). This EUA will remain in effect (meaning this test can be used) for the duration of the COVID-19 declaration under Section 564(b)(1) of the Act, 21 U.S.C. section 360bbb-3(b)(1), unless the authorization is terminated or revoked.  Performed at [facility], [place] [address], [place], [place] [address]       Studies:  CT ABDOMEN PELVIS WO CONTRAST  Result Date: 08/23/2020 CLINICAL DATA:  Abdominal pain, fever. EXAM: CT ABDOMEN AND PELVIS WITHOUT CONTRAST TECHNIQUE: Multidetector CT imaging of the abdomen and pelvis was performed following the standard protocol without IV contrast. COMPARISON:  July 20, 2020. FINDINGS: Lower chest: No acute abnormality. Hepatobiliary: No focal liver abnormality is seen. No gallstones, gallbladder wall thickening, or biliary dilatation. Pancreas: Unremarkable. No pancreatic ductal dilatation or surrounding inflammatory changes. Spleen: Normal in size without focal abnormality. Adrenals/Urinary Tract: Adrenal glands are unremarkable. Kidneys are normal, without renal calculi, focal lesion, or hydronephrosis. Bladder is unremarkable. Stomach/Bowel: Stomach is within normal limits. Appendix appears normal. No evidence of bowel wall thickening, distention, or inflammatory changes. Vascular/Lymphatic: No significant vascular findings are present. No enlarged abdominal or pelvic lymph nodes. Reproductive: Uterus and bilateral adnexa are unremarkable. Other: No abdominal wall hernia or abnormality. No abdominopelvic ascites. Musculoskeletal: No acute or significant osseous findings. IMPRESSION: No  acute abnormality seen in the abdomen pelvis. Electronically Signed   By: Marijo Conception M.D.   On: 08/23/2020 14:53   DG Chest Port 1 View  Result Date: 08/23/2020 CLINICAL DATA:  Question sepsis EXAM: PORTABLE CHEST 1 VIEW COMPARISON:  05/15/2019 FINDINGS: Mild hypoventilation with mild bibasilar atelectasis. No definite pneumonia or effusion. Negative for heart failure. Interval placement of left jugular Port-A-Cath with the tip in the SVC. IMPRESSION: Hypoventilation with mild bibasilar atelectasis. Electronically Signed   By: Franchot Gallo M.D.   On: 08/23/2020 12:18    Assessment: 55 y.o. with history  of hypertension, anxiety, breast cancer stage II, on adjuvant chemotherapy, presented with severe abdominal pain and neutropenic fever.  1.  Neutropenic fever, UTI  2.  Abdominal pain 3.  Stage IIa breast cancer, on adjuvant chemotherapy 4. HTN 5. Anxiety   Plan -I reviewed her labs and CT scan, and discussed the findings with patient. -She is mildly neutropenic, UA was positive agree with broad antibiotics, waiting for cultures.  She has received G-CSF after chemo last week -Her abdominal CT scan was negative for acute findings, her abdominal pain gastritis from chemotherapy, agree with PPI -Patient is very anxious and nervous, I reassured her that she will likely recover well and we can consider slight dose reduction in next three remaining cycle chemotherapy, with adequate supportive care. -I will f/u   Truitt Merle, MD  08/23/2020

## 2020-08-24 NOTE — Progress Notes (Signed)
PROGRESS NOTE    Sherry Carrillo  YIR:[id number] DOB: [birth year] DOA: 08/23/2020 PCP: Vivi Barrack, MD   Brief Narrative:  The patient is a 55 year old obese female with a past medical history significant for but not limited to hypertension, anxiety, history of stage IIa breast history currently on adjuvant chemotherapy as well as other comorbidities who presented with abdominal pain.  She reported that she had chemotherapy 5 days ago and was doing well until yesterday.  She noted to have umbilical pain that was sharp and constant was a 9 out of 10 in severity.  She states that she tried a heating pad which helped some but did not give her complete relief.  She denies any changes in her stool and denied any nausea or vomiting.  Because the pain persisted she became concerned and came to the ED.  She is found to be febrile and neutropenic and was admitted for febrile neutropenia and started on IV Zosyn.  TRH was consulted for admission and medical oncology was consulted for assistance in management.  Patient continues to feel extremely weak.  Her LFTs are abnormal now.  Assessment & Plan:   Active Problems:   Febrile neutropenia (HCC)  Febrile Neutropenia -Patient presented with a Temp of 101.2 and a WBC of 1.9 and ANC of 1000 -Likely 2/2 to suspected UTI -U/A done and showed Hazy appearance, Large Leukocytes, Rare Bacteria, 0-5 Non Squamous Epithelial Cells, 0-5 RBC/HPF, 6-10 Squamous Epithelial Cell/HPF and >50 WBC and unfortunately no urine culture was sent so we will order 1 now -CT Abdomen and Pelvis done and showed "No acute abnormality seen in the abdomen pelvis" -CXR done and showed "Mild hypoentilation with mild bibasilar atelectasis. No definite pneumonia or effusion. Negative for heart failure. Interval placement of left jugular Port-A-Cath with the tip in the SVC." -Blood Cx x2 pending -WBC is mildly improved to 2.2 with Differential pending this AM to determine ANC -Heme/Onc  consulted for further evaluation and Recommendations -LA went from 1.6 -> 1.3 -C/w Empiric Abx with IV Zosyn -Defer to oncology to start G-CSF  Abdominal Pain -Admitted to Inpatient Telemetry -Lipase Level was Normal; LFTs slightly worsening -CT Abdomen and Pelvis done and as above   Hyponatremia -Mild and Improved. Patient's Na+ went from 134 -> 136 -> 135 -Continue to Monitor and Trend and repeat CMP in the AM   Stage IIa Breast Cancer currently on Chemotherapy -Patient started adjuvant chemotherapy last week -Per oncology she received G-CSF last week after her chemotherapy -Oncology considering slight dose reduction in next 3 remaining cycles-supportive care  HTN -BP is well controlled -She takes lisinopril-hydrochlorothiazide 20-12.5 mg once a day -Continue to Monitor BP per Protocol -Last BP was 115/71  Anxiety  -Reported to be no longer taking Venlafaxine 37.5 milligrams p.o. twice daily  GERD -Continue with Pantoprazole 40 mg p.o. daily  Thrombocytopenia, improving  -Patient's Platelet Count went from 145 -> 153 -Continue to Monitor and Trend  Abnormal LFT's -Patient's AST went from 22 -> 69 and ALT went from 32 -> 76 -Likely reactive and ? Abx Related -Continue to Monitor and Trend Hepatic Fxn Panel and if not improving or worsening will consider obtaining a RUQ U/S and an Acute Hepatitis Panel -Repeat CMP in the AM   Obesity -Complicates overall prognosis and care -Estimated body mass index is 31.25 kg/m as calculated from the following:   Height as of this encounter: 5' (1.524 m).   Weight as of this encounter: 72.6 kg. -Weight  Loss and Dietary Counseling given   DVT prophylaxis: SCDs Code Status: FULL CODE  Family Communication: Discussed with Husband at bedside  Disposition Plan: Pending further clinical improvement and clearance by Heme/Onc  Status is: Inpatient  Remains inpatient appropriate because:Unsafe d/c plan, IV treatments appropriate due  to intensity of illness or inability to take PO and Inpatient level of care appropriate due to severity of illness   Dispo: The patient is from: Home              Anticipated d/c is to: Home              Patient currently is not medically stable to d/c.   Difficult to place patient No  Consultants:   Medical Oncology   Procedures: None  Antimicrobials:  Anti-infectives (From admission, onward)   Start     Dose/Rate Route Frequency Ordered Stop   08/23/20 2200  piperacillin-tazobactam (ZOSYN) IVPB 3.375 g        3.375 g 12.5 mL/hr over 240 Minutes Intravenous Every 8 hours 08/23/20 1520     08/23/20 1530  piperacillin-tazobactam (ZOSYN) IVPB 3.375 g        3.375 g 100 mL/hr over 30 Minutes Intravenous  Once 08/23/20 1518 08/23/20 1613   08/23/20 1245  piperacillin-tazobactam (ZOSYN) IVPB 3.375 g  Status:  Discontinued        3.375 g 100 mL/hr over 30 Minutes Intravenous  Once 08/23/20 1240 08/23/20 1517        Subjective: Seen and examined at bedside and she states that she is extremely weak.  No nausea or vomiting.  Thinks her abdomen is doing better.  Denies any lightheadedness or dizziness.  No other concerns or plans at this time.  Objective: Vitals:   08/23/20 2034 08/23/20 2210 08/24/20 0014 08/24/20 0455  BP: 103/72 (!) 94/59 (!) 94/47 114/65  Pulse: (!) 105 (!) 109 97 (!) 101  Resp: 18 18 18 18   Temp: (!) 101.2 F (38.4 C) 100 F (37.8 C) 99.2 F (37.3 C) 99.4 F (37.4 C)  TempSrc: Oral Oral Oral Oral  SpO2: 93% 95% 95% 96%  Weight:      Height:        Intake/Output Summary (Last 24 hours) at 08/24/2020 8119 Last data filed at 08/23/2020 1835 Gross per 24 hour  Intake 1120 ml  Output 1 ml  Net 1119 ml   Filed Weights   08/23/20 1001  Weight: 72.6 kg   Examination: Physical Exam:  Constitutional: WN/WD obese female in NAD and appears calm and comfortable Eyes: Lids and conjunctivae normal, sclerae anicteric  ENMT: External Ears, Nose appear  normal. Grossly normal hearing. Neck: Appears normal, supple, no cervical masses, normal ROM, no appreciable thyromegaly,: No JVD Respiratory: Diminished to auscultation bilaterally with coarse breath sounds, no wheezing, rales, rhonchi or crackles. Normal respiratory effort and patient is not tachypenic. No accessory muscle use.  Cardiovascular: RRR, no murmurs / rubs / gallops. S1 and S2 auscultated.  Minimal extremity edema Abdomen: Soft, mildly-tender, Distended. Bowel sounds positive.  GU: Deferred. Musculoskeletal: No clubbing / cyanosis of digits/nails. No joint deformity upper and lower extremities.  Skin: No rashes, lesions, ulcers on limited skin evaluation. No induration; Warm and dry.  Neurologic: CN 2-12 grossly intact with no focal deficits. Romberg sign and cerebellar reflexes not assessed.  Psychiatric: Normal judgment and insight. Alert and oriented x 3. Normal mood and appropriate affect.   Data Reviewed: I have personally reviewed following labs and imaging studies  CBC: Recent Labs  Lab 08/18/20 0802 08/23/20 1007 08/23/20 1245 08/23/20 1355 08/24/20 0519  WBC 14.2* 1.9*  --  1.5* 2.2*  NEUTROABS 11.8*  --  1.0*  --   --   HGB 13.0 14.4  --  12.1 12.0  HCT 38.9 45.0  --  37.2 37.3  MCV 83.3 86.9  --  86.3 87.1  PLT 298 PLATELET CLUMPS NOTED ON SMEAR, UNABLE TO ESTIMATE  --  145* 025   Basic Metabolic Panel: Recent Labs  Lab 08/18/20 0802 08/23/20 1007 08/23/20 1456 08/24/20 0519  NA 140 134* 136 135  K 3.8 4.5 4.4 4.6  CL 104 99 102 102  CO2 24 27 26 28   GLUCOSE 140* 111* 103* 121*  BUN 16 15 13 16   CREATININE 0.72 0.78 0.66 0.89  CALCIUM 9.8 9.3 9.0 8.6*   GFR: Estimated Creatinine Clearance: 63.5 mL/min (by C-G formula based on SCr of 0.89 mg/dL). Liver Function Tests: Recent Labs  Lab 08/18/20 0802 08/23/20 1007 08/23/20 1456 08/24/20 0519  AST 20 29 22  69*  ALT 31 39 32 76*  ALKPHOS 86 96 81 82  BILITOT 0.2* 0.4 0.9 0.5  PROT 7.3 7.6  6.8 6.6  ALBUMIN 3.7 3.9 3.6 3.4*   Recent Labs  Lab 08/23/20 1007  LIPASE 28   No results for input(s): AMMONIA in the last 168 hours. Coagulation Profile: Recent Labs  Lab 08/23/20 1043  INR 1.0   Cardiac Enzymes: No results for input(s): CKTOTAL, CKMB, CKMBINDEX, TROPONINI in the last 168 hours. BNP (last 3 results) No results for input(s): PROBNP in the last 8760 hours. HbA1C: No results for input(s): HGBA1C in the last 72 hours. CBG: No results for input(s): GLUCAP in the last 168 hours. Lipid Profile: No results for input(s): CHOL, HDL, LDLCALC, TRIG, CHOLHDL, LDLDIRECT in the last 72 hours. Thyroid Function Tests: No results for input(s): TSH, T4TOTAL, FREET4, T3FREE, THYROIDAB in the last 72 hours. Anemia Panel: No results for input(s): VITAMINB12, FOLATE, FERRITIN, TIBC, IRON, RETICCTPCT in the last 72 hours. Sepsis Labs: Recent Labs  Lab 08/23/20 1124 08/23/20 1355  LATICACIDVEN 1.6 1.3    Recent Results (from the past 240 hour(s))  Resp Panel by RT-PCR (Flu A&B, Covid) Nasopharyngeal Swab     Status: None   Collection Time: 08/23/20 10:45 AM   Specimen: Nasopharyngeal Swab; Nasopharyngeal(NP) swabs in vial transport medium  Result Value Ref Range Status   SARS Coronavirus 2 by RT PCR NEGATIVE NEGATIVE Final    Comment: (NOTE) SARS-CoV-2 target nucleic acids are NOT DETECTED.  The SARS-CoV-2 RNA is generally detectable in upper respiratory specimens during the acute phase of infection. The lowest concentration of SARS-CoV-2 viral copies this assay can detect is 138 copies/mL. A negative result does not preclude SARS-Cov-2 infection and should not be used as the sole basis for treatment or other patient management decisions. A negative result may occur with  improper specimen collection/handling, submission of specimen other than nasopharyngeal swab, presence of viral mutation(s) within the areas targeted by this assay, and inadequate number of  viral copies(<138 copies/mL). A negative result must be combined with clinical observations, patient history, and epidemiological information. The expected result is Negative.  Fact Sheet for Patients:  EntrepreneurPulse.com.au  Fact Sheet for Healthcare Providers:  IncredibleEmployment.be  This test is no t yet approved or cleared by the [place] FDA and  has been authorized for detection and/or diagnosis of SARS-CoV-2 by FDA under an Emergency Use Authorization (EUA). This EUA  will remain  in effect (meaning this test can be used) for the duration of the COVID-19 declaration under Section 564(b)(1) of the Act, 21 U.S.C.section 360bbb-3(b)(1), unless the authorization is terminated  or revoked sooner.       Influenza A by PCR NEGATIVE NEGATIVE Final   Influenza B by PCR NEGATIVE NEGATIVE Final    Comment: (NOTE) The Xpert Xpress SARS-CoV-2/FLU/RSV plus assay is intended as an aid in the diagnosis of influenza from Nasopharyngeal swab specimens and should not be used as a sole basis for treatment. Nasal washings and aspirates are unacceptable for Xpert Xpress SARS-CoV-2/FLU/RSV testing.  Fact Sheet for Patients: EntrepreneurPulse.com.au  Fact Sheet for Healthcare Providers: IncredibleEmployment.be  This test is not yet approved or cleared by the [place] FDA and has been authorized for detection and/or diagnosis of SARS-CoV-2 by FDA under an Emergency Use Authorization (EUA). This EUA will remain in effect (meaning this test can be used) for the duration of the COVID-19 declaration under Section 564(b)(1) of the Act, 21 U.S.C. section 360bbb-3(b)(1), unless the authorization is terminated or revoked.  Performed at [facility], [place] [address] [place], [place] [address]   Blood Culture (routine x 2)     Status: None (Preliminary result)   Collection Time: 08/23/20 11:02  AM   Specimen: BLOOD  Result Value Ref Range Status   Specimen Description   Final    BLOOD BLOOD RIGHT FOREARM Performed at [facility] [address] [place], [place] [address]    Special Requests   Final    BOTTLES DRAWN AEROBIC AND ANAEROBIC Blood Culture adequate volume Performed at [facility] [address], [place], [place] [address]    Culture   Final    NO GROWTH < 24 HOURS Performed at [facility] [address], [place], [place] [address]    Report Status PENDING  Incomplete  Blood Culture (routine x 2)     Status: None (Preliminary result)   Collection Time: 08/23/20 11:24 AM   Specimen: BLOOD  Result Value Ref Range Status   Specimen Description   Final    BLOOD RIGHT ANTECUBITAL Performed at [facility] [address] [place], [place] [address]    Special Requests   Final    BOTTLES DRAWN AEROBIC AND ANAEROBIC Blood Culture adequate volume Performed at [facility] [address], [place], [place] [address]    Culture   Final    NO GROWTH < 24 HOURS Performed at [facility] [address] [place], [place] [address]    Report Status PENDING  Incomplete     RN Pressure Injury Documentation:     Estimated body mass index is 31.25 kg/m as calculated from the following:   Height as of this encounter: 5' (1.524 m).   Weight as of this encounter: 72.6 kg.  Malnutrition Type:   Malnutrition Characteristics:   Nutrition Interventions:     Radiology Studies: CT ABDOMEN PELVIS WO CONTRAST  Result Date: 08/23/2020 CLINICAL DATA:  Abdominal pain, fever. EXAM: CT ABDOMEN AND PELVIS WITHOUT CONTRAST TECHNIQUE: Multidetector CT imaging of the abdomen and pelvis was performed following the standard protocol without IV contrast. COMPARISON:  July 20, 2020. FINDINGS: Lower chest: No acute abnormality. Hepatobiliary: No focal liver abnormality is seen. No gallstones,  gallbladder wall thickening, or biliary dilatation. Pancreas: Unremarkable. No pancreatic ductal dilatation or surrounding inflammatory changes. Spleen: Normal in size without focal abnormality. Adrenals/Urinary  Tract: Adrenal glands are unremarkable. Kidneys are normal, without renal calculi, focal lesion, or hydronephrosis. Bladder is unremarkable. Stomach/Bowel: Stomach is within normal limits. Appendix appears normal. No evidence of bowel wall thickening, distention, or inflammatory changes. Vascular/Lymphatic: No significant vascular findings are present. No enlarged abdominal or pelvic lymph nodes. Reproductive: Uterus and bilateral adnexa are unremarkable. Other: No abdominal wall hernia or abnormality. No abdominopelvic ascites. Musculoskeletal: No acute or significant osseous findings. IMPRESSION: No acute abnormality seen in the abdomen pelvis. Electronically Signed   By: Marijo Conception M.D.   On: 08/23/2020 14:53   DG Chest Port 1 View  Result Date: 08/23/2020 CLINICAL DATA:  Question sepsis EXAM: PORTABLE CHEST 1 VIEW COMPARISON:  05/15/2019 FINDINGS: Mild hypoventilation with mild bibasilar atelectasis. No definite pneumonia or effusion. Negative for heart failure. Interval placement of left jugular Port-A-Cath with the tip in the SVC. IMPRESSION: Hypoventilation with mild bibasilar atelectasis. Electronically Signed   By: Franchot Gallo M.D.   On: 08/23/2020 12:18   Scheduled Meds: . pantoprazole  40 mg Oral Daily   Continuous Infusions: . piperacillin-tazobactam (ZOSYN)  IV 3.375 g (08/24/20 0516)    LOS: 1 day   Kerney Elbe, DO [facility] PAGER is on [place]  If 7PM-7AM, please contact night-coverage [email or web address]

## 2020-08-24 NOTE — Plan of Care (Signed)

## 2020-08-25 ENCOUNTER — Inpatient Hospital Stay (HOSPITAL_COMMUNITY): Payer: BC Managed Care – PPO

## 2020-08-25 DIAGNOSIS — D72829 Elevated white blood cell count, unspecified: Secondary | ICD-10-CM

## 2020-08-25 LAB — COMPREHENSIVE METABOLIC PANEL
ALT: 69 U/L — ABNORMAL HIGH (ref 0–44)
AST: 39 U/L (ref 15–41)
Albumin: 3.1 g/dL — ABNORMAL LOW (ref 3.5–5.0)
Alkaline Phosphatase: 91 U/L (ref 38–126)
Anion gap: 8 (ref 5–15)
BUN: 13 mg/dL (ref 6–20)
CO2: 25 mmol/L (ref 22–32)
Calcium: 8.7 mg/dL — ABNORMAL LOW (ref 8.9–10.3)
Chloride: 102 mmol/L (ref 98–111)
Creatinine, Ser: 0.8 mg/dL (ref 0.44–1.00)
GFR, Estimated: >60 mL/min (ref >60)
Glucose, Bld: 107 mg/dL — ABNORMAL HIGH (ref 70–99)
Potassium: 4.1 mmol/L (ref 3.5–5.1)
Sodium: 135 mmol/L (ref 135–145)
Total Bilirubin: 0.5 mg/dL (ref 0.3–1.2)
Total Protein: 6.4 g/dL — ABNORMAL LOW (ref 6.5–8.1)

## 2020-08-25 LAB — CBC WITH DIFFERENTIAL/PLATELET
Abs Immature Granulocytes: 1.84 10*3/uL — ABNORMAL HIGH (ref 0.00–0.07)
Basophils Absolute: 0 10*3/uL (ref 0.0–0.1)
Basophils Relative: 0 %
Eosinophils Absolute: 0.1 10*3/uL (ref 0.0–0.5)
Eosinophils Relative: 1 %
HCT: 37.7 % (ref 36.0–46.0)
Hemoglobin: 12.1 g/dL (ref 12.0–15.0)
Immature Granulocytes: 14 %
Lymphocytes Relative: 18 %
Lymphs Abs: 2.4 10*3/uL (ref 0.7–4.0)
MCH: 27.6 pg (ref 26.0–34.0)
MCHC: 32.1 g/dL (ref 30.0–36.0)
MCV: 85.9 fL (ref 80.0–100.0)
Monocytes Absolute: 2 10*3/uL — ABNORMAL HIGH (ref 0.1–1.0)
Monocytes Relative: 15 %
Neutro Abs: 6.9 10*3/uL (ref 1.7–7.7)
Neutrophils Relative %: 52 %
Platelets: 188 10*3/uL (ref 150–400)
RBC: 4.39 MIL/uL (ref 3.87–5.11)
RDW: 12.5 % (ref 11.5–15.5)
WBC Morphology: ABNORMAL
WBC: 13.3 10*3/uL — ABNORMAL HIGH (ref 4.0–10.5)
nRBC: 0 % (ref 0.0–0.2)

## 2020-08-25 LAB — PHOSPHORUS: Phosphorus: 3.2 mg/dL (ref 2.5–4.6)

## 2020-08-25 LAB — MAGNESIUM: Magnesium: 2 mg/dL (ref 1.7–2.4)

## 2020-08-25 MED ORDER — VANCOMYCIN HCL 1500 MG/300ML IV SOLN
1500.0000 mg | Freq: Once | INTRAVENOUS | Status: AC
  Administered 2020-08-25: 1500 mg via INTRAVENOUS
  Filled 2020-08-25: qty 300

## 2020-08-25 MED ORDER — KETOROLAC TROMETHAMINE 15 MG/ML IJ SOLN
15.0000 mg | Freq: Once | INTRAMUSCULAR | Status: AC
  Administered 2020-08-25: 15 mg via INTRAVENOUS
  Filled 2020-08-25: qty 1

## 2020-08-25 MED ORDER — KETOROLAC TROMETHAMINE 30 MG/ML IJ SOLN: 30.0000 mg | Freq: Once | INTRAMUSCULAR | Status: DC

## 2020-08-25 MED ORDER — SODIUM CHLORIDE 0.9 % IV SOLN
2.0000 g | Freq: Three times a day (TID) | INTRAVENOUS | Status: DC
  Administered 2020-08-25 – 2020-08-27 (×6): 2 g via INTRAVENOUS
  Filled 2020-08-25 (×7): qty 2

## 2020-08-25 MED ORDER — PANTOPRAZOLE SODIUM 40 MG PO TBEC
40.0000 mg | DELAYED_RELEASE_TABLET | Freq: Two times a day (BID) | ORAL | Status: DC
Start: 2020-08-25 — End: 2020-08-27
  Administered 2020-08-25 – 2020-08-27 (×5): 40 mg via ORAL
  Filled 2020-08-25 (×5): qty 1

## 2020-08-25 MED ORDER — METRONIDAZOLE 500 MG/100ML IV SOLN
500.0000 mg | Freq: Three times a day (TID) | INTRAVENOUS | Status: DC
  Administered 2020-08-25 – 2020-08-27 (×6): 500 mg via INTRAVENOUS
  Filled 2020-08-25 (×6): qty 100

## 2020-08-25 MED ORDER — VANCOMYCIN HCL 1000 MG/200ML IV SOLN
1000.0000 mg | INTRAVENOUS | Status: DC
  Administered 2020-08-26: 1000 mg via INTRAVENOUS
  Filled 2020-08-25: qty 200

## 2020-08-25 MED ORDER — CALCIUM CARBONATE ANTACID 500 MG PO CHEW
1.0000 | CHEWABLE_TABLET | Freq: Two times a day (BID) | ORAL | Status: DC
  Administered 2020-08-25 – 2020-08-26 (×3): 200 mg via ORAL
  Filled 2020-08-25 (×5): qty 1

## 2020-08-25 MED ORDER — GUAIFENESIN ER 600 MG PO TB12
1200.0000 mg | ORAL_TABLET | Freq: Two times a day (BID) | ORAL | Status: DC
  Administered 2020-08-25 – 2020-08-26 (×4): 1200 mg via ORAL
  Filled 2020-08-25 (×5): qty 2

## 2020-08-25 NOTE — Progress Notes (Signed)
PROGRESS NOTE    GAE BIHL  JKD:[id number] DOB: 1965/08/30 DOA: 08/23/2020 PCP: Vivi Barrack, MD   Brief Narrative:  The patient is a 55 year old obese female with a past medical history significant for but not limited to hypertension, anxiety, history of stage IIa breast history currently on adjuvant chemotherapy as well as other comorbidities who presented with abdominal pain.  She reported that she had chemotherapy 5 days ago and was doing well until yesterday.  She noted to have umbilical pain that was sharp and constant was a 9 out of 10 in severity.  She states that she tried a heating pad which helped some but did not give her complete relief.  She denies any changes in her stool and denied any nausea or vomiting.  Because the pain persisted she became concerned and came to the ED.  She is found to be febrile and neutropenic and was admitted for febrile neutropenia and started on IV Zosyn but this has been escalated to IV vancomycin and IV cefepime and Flagyl given her continued fevers.  TRH was consulted for admission and medical oncology was consulted for assistance in management.  Patient continues to feel extremely weak but was little bit better today and her main concern was her chest congestion.  Her LFTs are abnormal now.  Assessment & Plan:   Active Problems:   Febrile neutropenia (HCC)  Febrile Neutropenia, but improving slowly -Patient presented with a Temp of 101.2 and a WBC of 1.9 and ANC of 1000; her Turner is now 6900 and WBC is 13.3 however she has been febrile overnight and her T-max was 101.8 -Likely 2/2 to suspected UTI but could be secondary to pneumonia -U/A done and showed Hazy appearance, Large Leukocytes, Rare Bacteria, 0-5 Non Squamous Epithelial Cells, 0-5 RBC/HPF, 6-10 Squamous Epithelial Cell/HPF and >50 WBC and unfortunately no urine culture was sent so we will order 1 now and this is still pending -CT Abdomen and Pelvis done and showed "No acute abnormality  seen in the abdomen pelvis" -CXR done and showed "Mild hypoentilation with mild bibasilar atelectasis. No definite pneumonia or effusion. Negative for heart failure. Interval placement of left jugular Port-A-Cath with the tip in the SVC." -Blood Cx x2 showing NGTD at 2 Days  -WBC is mildly improved to 2.2 yesterday with Differential pending this AM to determine ANC -Heme/Onc consulted for further evaluation and Recommendations -LA went from 1.6 -> 1.3 -Empiric Abx with IV Zosyn changed to IV cefepime and Flagyl plus IV vancomycin given her continued fevers -CXR today done and was read as no active disease and  showed "Stable appearance of the left subclavian Port-A-Cath with the tip near the superior cavoatrial junction. Lungs are clear. Heart and mediastinum are within normal limits and stable. Again noted are surgical clips in the left axilla and left   breast. Trachea is midline."  -Defer to oncology to start G-CSF however no need now that her [place] >1500  Abdominal Pain, intermittent -Admitted to Inpatient Telemetry -Lipase Level was Normal; LFTs slightly worsening -CT Abdomen and Pelvis done and as above  -Could be secondary to GERD so we will continue PPI but have increased at the twice daily now and added Tums  Chest Congestion -Says she has pain in her chest when she coughs significantl and is unable to produce sputum -Start Flutter Valve, Incentive Spirometry, and Add Guaifenesin 1200 mg po BID  -CXR as above -Chaning Abx as above as well -Given IV Ketorolac 15 mg x1  for MSK pain from coughing   Hyponatremia -Mild and Improved. Patient's Na+ went from 134 -> 136 -> 135 -Continue to Monitor and Trend and repeat CMP in the AM   Stage IIa Breast Cancer currently on Chemotherapy -Patient started adjuvant chemotherapy last week -Per oncology she received G-CSF last week after her chemotherapy -Oncology considering slight dose reduction in next 3 remaining cycles-supportive  care  HTN -BP is well controlled -She takes lisinopril-hydrochlorothiazide 20-12.5 mg once a day -Continue to Monitor BP per Protocol -Last BP was 119/86  Anxiety  -Reported to be no longer taking Venlafaxine 37.5 milligrams p.o. twice daily  GERD -Continue with Pantoprazole 40 mg p.o. daily but will increase to BID  Thrombocytopenia, improving  -Patient's Platelet Count went from 145 -> 153 -> 188 -Continue to Monitor and Trend  Abnormal LFT's -Patient's AST went from 22 -> 69 -> 39  and ALT went from 32 -> 76 -> 69 -Likely reactive and ? Abx Related -Continue to Monitor and Trend Hepatic Fxn Panel and if not improving or worsening will consider obtaining a RUQ U/S and an Acute Hepatitis Panel -Repeat CMP in the AM   Obesity -Complicates overall prognosis and care -Estimated body mass index is 31.25 kg/m as calculated from the following:   Height as of this encounter: 5' (1.524 m).   Weight as of this encounter: 72.6 kg. -Weight Loss and Dietary Counseling given   DVT prophylaxis: SCDs Code Status: FULL CODE  Family Communication: Discussed with Husband at bedside  Disposition Plan: Pending further clinical improvement and clearance by Heme/Onc  Status is: Inpatient  Remains inpatient appropriate because:Unsafe d/c plan, IV treatments appropriate due to intensity of illness or inability to take PO and Inpatient level of care appropriate due to severity of illness   Dispo: The patient is from: Home              Anticipated d/c is to: Home              Patient currently is not medically stable to d/c.   Difficult to place patient No  Consultants:   Medical Oncology   Procedures: None  Antimicrobials:  Anti-infectives (From admission, onward)   Start     Dose/Rate Route Frequency Ordered Stop   08/26/20 1200  vancomycin (VANCOREADY) IVPB 1000 mg/200 mL        1,000 mg 200 mL/hr over 60 Minutes Intravenous Every 24 hours 08/25/20 1023     08/25/20 1400   ceFEPIme (MAXIPIME) 2 g in sodium chloride 0.9 % 100 mL IVPB        2 g 200 mL/hr over 30 Minutes Intravenous Every 8 hours 08/25/20 0943     08/25/20 1400  metroNIDAZOLE (FLAGYL) IVPB 500 mg        500 mg 100 mL/hr over 60 Minutes Intravenous Every 8 hours 08/25/20 0943     08/25/20 1030  vancomycin (VANCOREADY) IVPB 1500 mg/300 mL        1,500 mg 150 mL/hr over 120 Minutes Intravenous  Once 08/25/20 0944     08/23/20 2200  piperacillin-tazobactam (ZOSYN) IVPB 3.375 g  Status:  Discontinued        3.375 g 12.5 mL/hr over 240 Minutes Intravenous Every 8 hours 08/23/20 1520 08/25/20 0913   08/23/20 1530  piperacillin-tazobactam (ZOSYN) IVPB 3.375 g        3.375 g 100 mL/hr over 30 Minutes Intravenous  Once 08/23/20 1518 08/23/20 1613   08/23/20 1245  piperacillin-tazobactam (ZOSYN)  IVPB 3.375 g  Status:  Discontinued        3.375 g 100 mL/hr over 30 Minutes Intravenous  Once 08/23/20 1240 08/23/20 1517        Subjective: Seen and examined at bedside and thinks he is doing little bit better today but her main concern is now chest congestion and difficulty coughing and pain with coughing.  No nausea or vomiting.  Continues also have some mild abdominal pain as well as some pain in her bladder that she feels is radiating up towards her esophagus.  No other concerns or complaints at this time but she states that she definitely feels better than yesterday but still not 100% where she normally is.  Objective: Vitals:   08/24/20 1800 08/24/20 1956 08/24/20 2109 08/24/20 2215  BP:   119/86   Pulse:   100   Resp:   16   Temp: 100.3 F (37.9 C) (!) 101.7 F (38.7 C) 99.7 F (37.6 C) 99.5 F (37.5 C)  TempSrc: Oral  Oral   SpO2:   93%   Weight:      Height:        Intake/Output Summary (Last 24 hours) at 08/25/2020 1322 Last data filed at 08/25/2020 0900 Gross per 24 hour  Intake 240 ml  Output --  Net 240 ml   Filed Weights   08/23/20 1001  Weight: 72.6 kg    Examination: Physical Exam:  Constitutional: WN/WD obese Female in NAD appears slightly anxious and uncomfortable from coughing  Eyes: Lids and conjunctivae normal, sclerae anicteric  ENMT: External Ears, Nose appear normal. Grossly normal hearing.   Neck: Appears normal, supple, no cervical masses, normal ROM, no appreciable thyromegaly; no JVD Respiratory: Diminished to auscultation bilaterally, no wheezing, rales, rhonchi or crackles. Normal respiratory effort and patient is not tachypenic. No accessory muscle use. Unlabored breathing but has a non-productive cough Cardiovascular: RRR, no murmurs / rubs / gallops. S1 and S2 auscultated. Trace Extremity edema Abdomen: Soft, midly-tender, Distended 2/2 body habitus. Bowel sounds positive.  GU: Deferred. Musculoskeletal: No clubbing / cyanosis of digits/nails. No joint deformity upper and lower extremities.  Skin: No rashes, lesions, ulcers on a limited skin evaluation. No induration; Warm and dry.  Neurologic: CN 2-12 grossly intact with no focal deficits. Romberg sign cerebellar reflexes not assessed.  Psychiatric: Normal judgment and insight. Alert and oriented x 3. Slightly anxious mood and appropriate affect.   Data Reviewed: I have personally reviewed following labs and imaging studies  CBC: Recent Labs  Lab 08/23/20 1007 08/23/20 1245 08/23/20 1355 08/24/20 0519 08/24/20 0859 08/25/20 0803  WBC 1.9*  --  1.5* 2.2* DUP 13.3*  NEUTROABS  --  1.0*  --  0.4* PENDING 6.9  HGB 14.4  --  12.1 12.0 DUP 12.1  HCT 45.0  --  37.2 37.3 DUP 37.7  MCV 86.9  --  86.3 87.1 DUP 85.9  PLT PLATELET CLUMPS NOTED ON SMEAR, UNABLE TO ESTIMATE  --  145* 153 DUP 563   Basic Metabolic Panel: Recent Labs  Lab 08/23/20 1007 08/23/20 1456 08/24/20 0519 08/25/20 0803  NA 134* 136 135 135  K 4.5 4.4 4.6 4.1  CL 99 102 102 102  CO2 27 26 28 25   GLUCOSE 111* 103* 121* 107*  BUN 15 13 16 13   CREATININE 0.78 0.66 0.89 0.80  CALCIUM 9.3 9.0  8.6* 8.7*  MG  --   --   --  2.0  PHOS  --   --   --  3.2   GFR: Estimated Creatinine Clearance: 70.6 mL/min (by C-G formula based on SCr of 0.8 mg/dL). Liver Function Tests: Recent Labs  Lab 08/23/20 1007 08/23/20 1456 08/24/20 0519 08/25/20 0803  AST 29 22 69* 39  ALT 39 32 76* 69*  ALKPHOS 96 81 82 91  BILITOT 0.4 0.9 0.5 0.5  PROT 7.6 6.8 6.6 6.4*  ALBUMIN 3.9 3.6 3.4* 3.1*   Recent Labs  Lab 08/23/20 1007  LIPASE 28   No results for input(s): AMMONIA in the last 168 hours. Coagulation Profile: Recent Labs  Lab 08/23/20 1043  INR 1.0   Cardiac Enzymes: No results for input(s): CKTOTAL, CKMB, CKMBINDEX, TROPONINI in the last 168 hours. BNP (last 3 results) No results for input(s): PROBNP in the last 8760 hours. HbA1C: No results for input(s): HGBA1C in the last 72 hours. CBG: No results for input(s): GLUCAP in the last 168 hours. Lipid Profile: No results for input(s): CHOL, HDL, LDLCALC, TRIG, CHOLHDL, LDLDIRECT in the last 72 hours. Thyroid Function Tests: No results for input(s): TSH, T4TOTAL, FREET4, T3FREE, THYROIDAB in the last 72 hours. Anemia Panel: No results for input(s): VITAMINB12, FOLATE, FERRITIN, TIBC, IRON, RETICCTPCT in the last 72 hours. Sepsis Labs: Recent Labs  Lab 08/23/20 1124 08/23/20 1355  LATICACIDVEN 1.6 1.3    Recent Results (from the past 240 hour(s))  Resp Panel by RT-PCR (Flu A&B, Covid) Nasopharyngeal Swab     Status: None   Collection Time: 08/23/20 10:45 AM   Specimen: Nasopharyngeal Swab; Nasopharyngeal(NP) swabs in vial transport medium  Result Value Ref Range Status   SARS Coronavirus 2 by RT PCR NEGATIVE NEGATIVE Final    Comment: (NOTE) SARS-CoV-2 target nucleic acids are NOT DETECTED.  The SARS-CoV-2 RNA is generally detectable in upper respiratory specimens during the acute phase of infection. The lowest concentration of SARS-CoV-2 viral copies this assay can detect is 138 copies/mL. A negative result does  not preclude SARS-Cov-2 infection and should not be used as the sole basis for treatment or other patient management decisions. A negative result may occur with  improper specimen collection/handling, submission of specimen other than nasopharyngeal swab, presence of viral mutation(s) within the areas targeted by this assay, and inadequate number of viral copies(<138 copies/mL). A negative result must be combined with clinical observations, patient history, and epidemiological information. The expected result is Negative.  Fact Sheet for Patients:  EntrepreneurPulse.com.au  Fact Sheet for Healthcare Providers:  IncredibleEmployment.be  This test is no t yet approved or cleared by the [place] FDA and  has been authorized for detection and/or diagnosis of SARS-CoV-2 by FDA under an Emergency Use Authorization (EUA). This EUA will remain  in effect (meaning this test can be used) for the duration of the COVID-19 declaration under Section 564(b)(1) of the Act, 21 U.S.C.section 360bbb-3(b)(1), unless the authorization is terminated  or revoked sooner.       Influenza A by PCR NEGATIVE NEGATIVE Final   Influenza B by PCR NEGATIVE NEGATIVE Final    Comment: (NOTE) The Xpert Xpress SARS-CoV-2/FLU/RSV plus assay is intended as an aid in the diagnosis of influenza from Nasopharyngeal swab specimens and should not be used as a sole basis for treatment. Nasal washings and aspirates are unacceptable for Xpert Xpress SARS-CoV-2/FLU/RSV testing.  Fact Sheet for Patients: EntrepreneurPulse.com.au  Fact Sheet for Healthcare Providers: IncredibleEmployment.be  This test is not yet approved or cleared by the [place] FDA and has been authorized for detection and/or diagnosis of SARS-CoV-2 by FDA under  an Emergency Use Authorization (EUA). This EUA will remain in effect (meaning this test can be used) for the  duration of the COVID-19 declaration under Section 564(b)(1) of the Act, 21 U.S.C. section 360bbb-3(b)(1), unless the authorization is terminated or revoked.  Performed at [facility], [place] [address] [place], [place] [address]   Urine culture     Status: Abnormal   Collection Time: 08/23/20 10:57 AM   Specimen: In/Out Cath Urine  Result Value Ref Range Status   Specimen Description   Final    IN/OUT CATH URINE Performed at [facility] [address], [place], [place] [address]    Special Requests   Final    NONE Performed at [facility], [place] [address], [place], [place] [address]    Culture MULTIPLE SPECIES PRESENT, SUGGEST RECOLLECTION (A)  Final   Report Status 08/24/2020 FINAL  Final  Blood Culture (routine x 2)     Status: None (Preliminary result)   Collection Time: 08/23/20 11:02 AM   Specimen: BLOOD  Result Value Ref Range Status   Specimen Description   Final    BLOOD BLOOD RIGHT FOREARM Performed at [facility] [address] [place], [place] [address]    Special Requests   Final    BOTTLES DRAWN AEROBIC AND ANAEROBIC Blood Culture adequate volume Performed at [facility] [address] [place], [place] [address]    Culture   Final    NO GROWTH 2 DAYS Performed at [facility] [address], [place], [place] [address]    Report Status PENDING  Incomplete  Blood Culture (routine x 2)     Status: None (Preliminary result)   Collection Time: 08/23/20 11:24 AM   Specimen: BLOOD  Result Value Ref Range Status   Specimen Description   Final    BLOOD RIGHT ANTECUBITAL Performed at [facility] [address] [place], [place] [address]    Special Requests   Final    BOTTLES DRAWN AEROBIC AND ANAEROBIC Blood Culture adequate volume Performed at [facility] [address] [place],  [address]    Culture    Final    NO GROWTH 2 DAYS Performed at [facility] [address], [place],  [address]    Report Status PENDING  Incomplete     RN Pressure Injury Documentation:     Estimated body mass index is 31.25 kg/m as calculated from the following:   Height as of this encounter: 5' (1.524 m).   Weight as of this encounter: 72.6 kg.  Malnutrition Type:   Malnutrition Characteristics:   Nutrition Interventions:     Radiology Studies: CT ABDOMEN PELVIS WO CONTRAST  Result Date: 08/23/2020 CLINICAL DATA:  Abdominal pain, fever. EXAM: CT ABDOMEN AND PELVIS WITHOUT CONTRAST TECHNIQUE: Multidetector CT imaging of the abdomen and pelvis was performed following the standard protocol without IV contrast. COMPARISON:  July 20, 2020. FINDINGS: Lower chest: No acute abnormality. Hepatobiliary: No focal liver abnormality is seen. No gallstones, gallbladder wall thickening, or biliary dilatation. Pancreas: Unremarkable. No pancreatic ductal dilatation or surrounding inflammatory changes. Spleen: Normal in size without focal abnormality. Adrenals/Urinary Tract: Adrenal glands are unremarkable. Kidneys are normal, without renal calculi, focal lesion, or hydronephrosis. Bladder is unremarkable. Stomach/Bowel: Stomach is within normal limits. Appendix appears normal. No evidence of bowel wall thickening, distention, or inflammatory changes. Vascular/Lymphatic: No significant vascular findings are present. No enlarged abdominal or  pelvic lymph nodes. Reproductive: Uterus and bilateral adnexa are unremarkable. Other: No abdominal wall hernia or abnormality. No abdominopelvic ascites. Musculoskeletal: No acute or significant osseous findings. IMPRESSION: No acute abnormality seen in the abdomen pelvis. Electronically Signed   By: Marijo Conception M.D.   On: 08/23/2020 14:53   DG CHEST PORT 1 VIEW  Result Date: 08/25/2020 CLINICAL DATA:  Chest congestion. EXAM: PORTABLE CHEST 1 VIEW COMPARISON:   08/23/2020 FINDINGS: Stable appearance of the left subclavian Port-A-Cath with the tip near the superior cavoatrial junction. Lungs are clear. Heart and mediastinum are within normal limits and stable. Again noted are surgical clips in the left axilla and left breast. Trachea is midline. IMPRESSION: No active disease. Electronically Signed   By: Markus Daft M.D.   On: 08/25/2020 09:54   Scheduled Meds: . calcium carbonate  1 tablet Oral BID  . guaiFENesin  1,200 mg Oral BID  . pantoprazole  40 mg Oral BID   Continuous Infusions: . ceFEPime (MAXIPIME) IV    . metronidazole    . [START ON 08/26/2020] vancomycin    . vancomycin 1,500 mg (08/25/20 1126)    LOS: 2 days   Kerney Elbe, DO [facility] PAGER is on [place]  If 7PM-7AM, please contact night-coverage [email or web address]

## 2020-08-25 NOTE — Progress Notes (Signed)
Sherry Carrillo   DOB:03-17-1966   VO#:[id number]   VQQ#:[id number]  Oncology follow up   Subjective: Pt had fever last fever last night, but remained afebrile today.  Her epigastric pain has much improved, but she still has some discomfort after eating. No more diarrhea.    Objective:  Vitals:   08/25/20 2054 08/25/20 2210  BP:    Pulse:    Resp:    Temp: 98.6 F (37 C) 98.5 F (36.9 C)  SpO2:      Body mass index is 31.25 kg/m.  Intake/Output Summary (Last 24 hours) at 08/25/2020 2237 Last data filed at 08/25/2020 1434 Gross per 24 hour  Intake 640 ml  Output --  Net 640 ml     Sclerae unicteric  Oropharynx clear  No peripheral adenopathy  Lungs clear -- no rales or rhonchi  Heart regular rate and rhythm  Abdomen benign  MSK no focal spinal tenderness, no peripheral edema  Neuro nonfocal   CBG (last 3)  No results for input(s): GLUCAP in the last 72 hours.   Labs:   Urine Studies No results for input(s): UHGB, CRYS in the last 72 hours.  Invalid input(s): UACOL, UAPR, USPG, UPH, UTP, UGL, UKET, UBIL, UNIT, UROB, ULEU, UEPI, UWBC, URBC, UBAC, CAST, [place], [place]  Basic Metabolic Panel: Recent Labs  Lab 08/23/20 1007 08/23/20 1456 08/24/20 0519 08/25/20 0803  NA 134* 136 135 135  K 4.5 4.4 4.6 4.1  CL 99 102 102 102  CO2 27 26 28 25   GLUCOSE 111* 103* 121* 107*  BUN 15 13 16 13   CREATININE 0.78 0.66 0.89 0.80  CALCIUM 9.3 9.0 8.6* 8.7*  MG  --   --   --  2.0  PHOS  --   --   --  3.2   GFR Estimated Creatinine Clearance: 70.6 mL/min (by C-G formula based on SCr of 0.8 mg/dL). Liver Function Tests: Recent Labs  Lab 08/23/20 1007 08/23/20 1456 08/24/20 0519 08/25/20 0803  AST 29 22 69* 39  ALT 39 32 76* 69*  ALKPHOS 96 81 82 91  BILITOT 0.4 0.9 0.5 0.5  PROT 7.6 6.8 6.6 6.4*  ALBUMIN 3.9 3.6 3.4* 3.1*   Recent Labs  Lab 08/23/20 1007  LIPASE 28   No results for input(s): AMMONIA in the last 168 hours. Coagulation profile Recent Labs  Lab  08/23/20 1043  INR 1.0    CBC: Recent Labs  Lab 08/23/20 1007 08/23/20 1245 08/23/20 1355 08/24/20 0519 08/24/20 0859 08/25/20 0803  WBC 1.9*  --  1.5* 2.2* DUP 13.3*  NEUTROABS  --  1.0*  --  0.4* PENDING 6.9  HGB 14.4  --  12.1 12.0 DUP 12.1  HCT 45.0  --  37.2 37.3 DUP 37.7  MCV 86.9  --  86.3 87.1 DUP 85.9  PLT PLATELET CLUMPS NOTED ON SMEAR, UNABLE TO ESTIMATE  --  145* 153 DUP 188   Cardiac Enzymes: No results for input(s): CKTOTAL, CKMB, CKMBINDEX, TROPONINI in the last 168 hours. BNP: Invalid input(s): POCBNP CBG: No results for input(s): GLUCAP in the last 168 hours. D-Dimer No results for input(s): DDIMER in the last 72 hours. Hgb A1c No results for input(s): HGBA1C in the last 72 hours. Lipid Profile No results for input(s): CHOL, HDL, LDLCALC, TRIG, CHOLHDL, LDLDIRECT in the last 72 hours. Thyroid function studies No results for input(s): TSH, T4TOTAL, T3FREE, THYROIDAB in the last 72 hours.  Invalid input(s): FREET3 Anemia work up No results for  input(s): VITAMINB12, FOLATE, FERRITIN, TIBC, IRON, RETICCTPCT in the last 72 hours. Microbiology Recent Results (from the past 240 hour(s))  Resp Panel by RT-PCR (Flu A&B, Covid) Nasopharyngeal Swab     Status: None   Collection Time: 08/23/20 10:45 AM   Specimen: Nasopharyngeal Swab; Nasopharyngeal(NP) swabs in vial transport medium  Result Value Ref Range Status   SARS Coronavirus 2 by RT PCR NEGATIVE NEGATIVE Final    Comment: (NOTE) SARS-CoV-2 target nucleic acids are NOT DETECTED.  The SARS-CoV-2 RNA is generally detectable in upper respiratory specimens during the acute phase of infection. The lowest concentration of SARS-CoV-2 viral copies this assay can detect is 138 copies/mL. A negative result does not preclude SARS-Cov-2 infection and should not be used as the sole basis for treatment or other patient management decisions. A negative result may occur with  improper specimen collection/handling,  submission of specimen other than nasopharyngeal swab, presence of viral mutation(s) within the areas targeted by this assay, and inadequate number of viral copies(<138 copies/mL). A negative result must be combined with clinical observations, patient history, and epidemiological information. The expected result is Negative.  Fact Sheet for Patients:  EntrepreneurPulse.com.au  Fact Sheet for Healthcare Providers:  IncredibleEmployment.be  This test is no t yet approved or cleared by the [place] FDA and  has been authorized for detection and/or diagnosis of SARS-CoV-2 by FDA under an Emergency Use Authorization (EUA). This EUA will remain  in effect (meaning this test can be used) for the duration of the COVID-19 declaration under Section 564(b)(1) of the Act, 21 U.S.C.section 360bbb-3(b)(1), unless the authorization is terminated  or revoked sooner.       Influenza A by PCR NEGATIVE NEGATIVE Final   Influenza B by PCR NEGATIVE NEGATIVE Final    Comment: (NOTE) The Xpert Xpress SARS-CoV-2/FLU/RSV plus assay is intended as an aid in the diagnosis of influenza from Nasopharyngeal swab specimens and should not be used as a sole basis for treatment. Nasal washings and aspirates are unacceptable for Xpert Xpress SARS-CoV-2/FLU/RSV testing.  Fact Sheet for Patients: EntrepreneurPulse.com.au  Fact Sheet for Healthcare Providers: IncredibleEmployment.be  This test is not yet approved or cleared by the [place] FDA and has been authorized for detection and/or diagnosis of SARS-CoV-2 by FDA under an Emergency Use Authorization (EUA). This EUA will remain in effect (meaning this test can be used) for the duration of the COVID-19 declaration under Section 564(b)(1) of the Act, 21 U.S.C. section 360bbb-3(b)(1), unless the authorization is terminated or revoked.  Performed at [facility], [place] [address] [place], [place] [address]   Urine culture     Status: Abnormal   Collection Time: 08/23/20 10:57 AM   Specimen: In/Out Cath Urine  Result Value Ref Range Status   Specimen Description   Final    IN/OUT CATH URINE Performed at [place] [address] [place], [place] [address]    Special Requests   Final    NONE Performed at [facility], [place] [address], [place], [place] [address]    Culture MULTIPLE SPECIES PRESENT, SUGGEST RECOLLECTION (A)  Final   Report Status 08/24/2020 FINAL  Final  Blood Culture (routine x 2)     Status: None (Preliminary result)   Collection Time: 08/23/20 11:02 AM   Specimen: BLOOD  Result Value Ref Range Status   Specimen Description   Final    BLOOD BLOOD RIGHT FOREARM Performed at [place] [address]  [place], [place] [address]    Special Requests   Final    BOTTLES DRAWN AEROBIC AND ANAEROBIC Blood Culture adequate volume Performed at [facility] [address] [place], [place] [address]    Culture   Final    NO GROWTH 2 DAYS Performed at [place] [address], [place], [place] [address]    Report Status PENDING  Incomplete  Blood Culture (routine x 2)     Status: None (Preliminary result)   Collection Time: 08/23/20 11:24 AM   Specimen: BLOOD  Result Value Ref Range Status   Specimen Description   Final    BLOOD RIGHT ANTECUBITAL Performed at [place] [address] [place], [place] [address]    Special Requests   Final    BOTTLES DRAWN AEROBIC AND ANAEROBIC Blood Culture adequate volume Performed at [place] [address], [place],  [address]    Culture   Final    NO GROWTH 2 DAYS Performed at [place] [address], [place],  [address]    Report Status PENDING  Incomplete      Studies:  DG CHEST PORT 1 VIEW  Result Date:  08/25/2020 CLINICAL DATA:  Chest congestion. EXAM: PORTABLE CHEST 1 VIEW COMPARISON:  08/23/2020 FINDINGS: Stable appearance of the left subclavian Port-A-Cath with the tip near the superior cavoatrial junction. Lungs are clear. Heart and mediastinum are within normal limits and stable. Again noted are surgical clips in the left axilla and left breast. Trachea is midline. IMPRESSION: No active disease. Electronically Signed   By: Markus Daft M.D.   On: 08/25/2020 09:54    Assessment: 55 y.o. with history of hypertension, anxiety, breast cancer stage II, on adjuvant chemotherapy, presented with severe abdominal pain and neutropenic fever.  1.  Neutropenic fever, UTI  2.  Abdominal pain, improved  3.  Stage IIa breast cancer, on adjuvant chemotherapy 4. HTN 5. Anxiety   Plan -neutropenia resolved -has fever last night, antibiotics was broadened to vancomycin and cefepime -cultures have been negative so far -I do not have clinical suspicion for other etiology of fever, such as thrombosis or tumor fever  -continue PPI and other supportive care. -I encouraged her to have bland diet and ambulate more  -discharge per primary team   Truitt Merle, MD  08/25/2020

## 2020-08-25 NOTE — Progress Notes (Signed)
Pharmacy Antibiotic Note  Sherry Carrillo is a 55 y.o. female admitted on 08/23/2020 with febrile neutropenia.  Pharmacy has been consulted for cefepime and vancomycin dosing.  Patient has PMH significant for breast cancer currently undergoing chemotherapy. Pt was initiated on broad spectrum antibiotic pseudomonal coverage on admission with piperacillin/tazobactam. Pt continues to spike fevers, vancomycin being added. Pip/tazo changed to cefepime + flagyl to reduce risk of AKI.  Today, 08/25/20  WBC up 13.3, ANC now 6.9  Tmax 101.8 F  No growth on cultures  Plan:  Discontinue piperacillin/tazobactam  Cefepime 2 g IV q8h + metronidazole 500 mg IV q8h  Vancomycin 1500 mg LD followed by 1000 mg IV q24h  Goal vancomycin AUC 400-550. Check levels at steady state as indicated  Follow renal function and culture data  Height: 5' (152.4 cm) Weight: 72.6 kg (160 lb) IBW/kg (Calculated) : 45.5  Temp (24hrs), Avg:100.2 F (37.9 C), Min:98.4 F (36.9 C), Max:101.8 F (38.8 C)  Recent Labs  Lab 08/23/20 1007 08/23/20 1124 08/23/20 1355 08/23/20 1456 08/24/20 0519 08/24/20 0859 08/25/20 0803  WBC 1.9*  --  1.5*  --  2.2* DUP 13.3*  CREATININE 0.78  --   --  0.66 0.89  --  0.80  LATICACIDVEN  --  1.6 1.3  --   --   --   --     Estimated Creatinine Clearance: 70.6 mL/min (by C-G formula based on SCr of 0.8 mg/dL).    Allergies  Allergen Reactions  . Sulfa Antibiotics Nausea Only    Antimicrobials this admission: Pip/tazo 5/31 >> 6/2 vancomycin 6/2 >>  Cefepime 6/2 >> Metronidazole 6/2 >>  Dose adjustments this admission:  Microbiology results: 5/31 BCx: ngtd 5/31 UCx: multiple species   Thank you for allowing pharmacy to be a part of this patient's care.  Lenis Noon, PharmD 08/25/2020 9:45 AM

## 2020-08-26 ENCOUNTER — Encounter: Payer: Self-pay | Admitting: *Deleted

## 2020-08-26 ENCOUNTER — Inpatient Hospital Stay: Payer: BC Managed Care – PPO | Admitting: Hematology

## 2020-08-26 ENCOUNTER — Inpatient Hospital Stay: Payer: BC Managed Care – PPO

## 2020-08-26 LAB — COMPREHENSIVE METABOLIC PANEL
ALT: 53 U/L — ABNORMAL HIGH (ref 0–44)
AST: 37 U/L (ref 15–41)
Albumin: 3 g/dL — ABNORMAL LOW (ref 3.5–5.0)
Alkaline Phosphatase: 107 U/L (ref 38–126)
Anion gap: 8 (ref 5–15)
BUN: 12 mg/dL (ref 6–20)
CO2: 20 mmol/L — ABNORMAL LOW (ref 22–32)
Calcium: 8.6 mg/dL — ABNORMAL LOW (ref 8.9–10.3)
Chloride: 110 mmol/L (ref 98–111)
Creatinine, Ser: 0.78 mg/dL (ref 0.44–1.00)
GFR, Estimated: >60 mL/min (ref >60)
Glucose, Bld: 109 mg/dL — ABNORMAL HIGH (ref 70–99)
Potassium: 4.2 mmol/L (ref 3.5–5.1)
Sodium: 138 mmol/L (ref 135–145)
Total Bilirubin: 0.4 mg/dL (ref 0.3–1.2)
Total Protein: 5.9 g/dL — ABNORMAL LOW (ref 6.5–8.1)

## 2020-08-26 LAB — CBC WITH DIFFERENTIAL/PLATELET
Abs Immature Granulocytes: 7.62 10*3/uL — ABNORMAL HIGH (ref 0.00–0.07)
Basophils Absolute: 0 10*3/uL (ref 0.0–0.1)
Basophils Relative: 0 %
Eosinophils Absolute: 0.2 10*3/uL (ref 0.0–0.5)
Eosinophils Relative: 0 %
HCT: 36.8 % (ref 36.0–46.0)
Hemoglobin: 11.4 g/dL — ABNORMAL LOW (ref 12.0–15.0)
Immature Granulocytes: 17 %
Lymphocytes Relative: 17 %
Lymphs Abs: 7.7 10*3/uL — ABNORMAL HIGH (ref 0.7–4.0)
MCH: 27.6 pg (ref 26.0–34.0)
MCHC: 31 g/dL (ref 30.0–36.0)
MCV: 89.1 fL (ref 80.0–100.0)
Monocytes Absolute: 0.8 10*3/uL (ref 0.1–1.0)
Monocytes Relative: 2 %
Neutro Abs: 29.2 10*3/uL — ABNORMAL HIGH (ref 1.7–7.7)
Neutrophils Relative %: 64 %
Platelets: 197 10*3/uL (ref 150–400)
RBC: 4.13 MIL/uL (ref 3.87–5.11)
RDW: 12.8 % (ref 11.5–15.5)
WBC: 45.5 10*3/uL — ABNORMAL HIGH (ref 4.0–10.5)
nRBC: 0.1 % (ref 0.0–0.2)

## 2020-08-26 LAB — RESPIRATORY PANEL BY PCR

## 2020-08-26 LAB — MAGNESIUM: Magnesium: 1.7 mg/dL (ref 1.7–2.4)

## 2020-08-26 LAB — URINE CULTURE: Culture: NO GROWTH

## 2020-08-26 LAB — PHOSPHORUS: Phosphorus: 3.9 mg/dL (ref 2.5–4.6)

## 2020-08-26 MED ORDER — MAGNESIUM SULFATE 2 GM/50ML IV SOLN
2.0000 g | Freq: Once | INTRAVENOUS | Status: AC
  Administered 2020-08-26: 2 g via INTRAVENOUS
  Filled 2020-08-26: qty 50

## 2020-08-26 NOTE — Progress Notes (Signed)
Sherry Carrillo   DOB:[birth year]   NI#:[id number]   IRW#:[id number]  Oncology follow up   Subjective: Pt had low grade fever last night, afebrile today.  She was able to ambulate in the hallway, and sit in the recliner longer today.  She complains of right-sided chest tightness, and feels congested.  Mild cough, no other new symptoms.  She would like to go home tomorrow.  Objective:  Vitals:   08/26/20 1509 08/26/20 1657  BP: 132/81   Pulse: 100   Resp: 16   Temp: 98 F (36.7 C) 97.9 F (36.6 C)  SpO2: 94%     Body mass index is 31.25 kg/m.  Intake/Output Summary (Last 24 hours) at 08/26/2020 1815 Last data filed at 08/26/2020 7619 Gross per 24 hour  Intake 522.97 ml  Output --  Net 522.97 ml     Sclerae unicteric  Oropharynx clear  No peripheral adenopathy  Lungs clear -- no rales or rhonchi  Heart regular rate and rhythm  Abdomen benign  MSK no focal spinal tenderness, no peripheral edema  Neuro nonfocal   CBG (last 3)  No results for input(s): GLUCAP in the last 72 hours.   Labs:   Urine Studies No results for input(s): UHGB, CRYS in the last 72 hours.  Invalid input(s): UACOL, UAPR, USPG, UPH, UTP, UGL, UKET, UBIL, UNIT, UROB, [place], UEPI, UWBC, [place], [place]  Basic Metabolic Panel: Recent Labs  Lab 08/23/20 1007 08/23/20 1456 08/24/20 0519 08/25/20 0803 08/26/20 0519  NA 134* 136 135 135 138  K 4.5 4.4 4.6 4.1 4.2  CL 99 102 102 102 110  CO2 27 26 28 25  20*  GLUCOSE 111* 103* 121* 107* 109*  BUN 15 13 16 13 12   CREATININE 0.78 0.66 0.89 0.80 0.78  CALCIUM 9.3 9.0 8.6* 8.7* 8.6*  MG  --   --   --  2.0 1.7  PHOS  --   --   --  3.2 3.9   GFR Estimated Creatinine Clearance: 70.6 mL/min (by C-G formula based on SCr of 0.78 mg/dL). Liver Function Tests: Recent Labs  Lab 08/23/20 1007 08/23/20 1456 08/24/20 0519 08/25/20 0803 08/26/20 0519  AST 29 22 69* 39 37  ALT 39 32 76* 69* 53*  ALKPHOS 96 81 82 91 107  BILITOT 0.4 0.9 0.5 0.5 0.4   PROT 7.6 6.8 6.6 6.4* 5.9*  ALBUMIN 3.9 3.6 3.4* 3.1* 3.0*   Recent Labs  Lab 08/23/20 1007  LIPASE 28   No results for input(s): AMMONIA in the last 168 hours. Coagulation profile Recent Labs  Lab 08/23/20 1043  INR 1.0    CBC: Recent Labs  Lab 08/23/20 1245 08/23/20 1355 08/24/20 0519 08/24/20 0859 08/25/20 0803 08/26/20 0519  WBC  --  1.5* 2.2* DUP 13.3* 45.5*  NEUTROABS 1.0*  --  0.4* PENDING 6.9 29.2*  HGB  --  12.1 12.0 DUP 12.1 11.4*  HCT  --  37.2 37.3 DUP 37.7 36.8  MCV  --  86.3 87.1 DUP 85.9 89.1  PLT  --  145* 153 DUP 188 197   Cardiac Enzymes: No results for input(s): CKTOTAL, CKMB, CKMBINDEX, TROPONINI in the last 168 hours. BNP: Invalid input(s): POCBNP CBG: No results for input(s): GLUCAP in the last 168 hours. D-Dimer No results for input(s): DDIMER in the last 72 hours. Hgb A1c No results for input(s): HGBA1C in the last 72 hours. Lipid Profile No results for input(s): CHOL, HDL, LDLCALC, TRIG, CHOLHDL, LDLDIRECT in  the last 72 hours. Thyroid function studies No results for input(s): TSH, T4TOTAL, T3FREE, THYROIDAB in the last 72 hours.  Invalid input(s): FREET3 Anemia work up No results for input(s): VITAMINB12, FOLATE, FERRITIN, TIBC, IRON, RETICCTPCT in the last 72 hours. Microbiology Recent Results (from the past 240 hour(s))  Resp Panel by RT-PCR (Flu A&B, Covid) Nasopharyngeal Swab     Status: None   Collection Time: 08/23/20 10:45 AM   Specimen: Nasopharyngeal Swab; Nasopharyngeal(NP) swabs in vial transport medium  Result Value Ref Range Status   SARS Coronavirus 2 by RT PCR NEGATIVE NEGATIVE Final    Comment: (NOTE) SARS-CoV-2 target nucleic acids are NOT DETECTED.  The SARS-CoV-2 RNA is generally detectable in upper respiratory specimens during the acute phase of infection. The lowest concentration of SARS-CoV-2 viral copies this assay can detect is 138 copies/mL. A negative result does not preclude SARS-Cov-2 infection and  should not be used as the sole basis for treatment or other patient management decisions. A negative result may occur with  improper specimen collection/handling, submission of specimen other than nasopharyngeal swab, presence of viral mutation(s) within the areas targeted by this assay, and inadequate number of viral copies(<138 copies/mL). A negative result must be combined with clinical observations, patient history, and epidemiological information. The expected result is Negative.  Fact Sheet for Patients:  EntrepreneurPulse.com.au  Fact Sheet for Healthcare Providers:  IncredibleEmployment.be  This test is no t yet approved or cleared by the [place] FDA and  has been authorized for detection and/or diagnosis of SARS-CoV-2 by FDA under an Emergency Use Authorization (EUA). This EUA will remain  in effect (meaning this test can be used) for the duration of the COVID-19 declaration under Section 564(b)(1) of the Act, 21 U.S.C.section 360bbb-3(b)(1), unless the authorization is terminated  or revoked sooner.       Influenza A by PCR NEGATIVE NEGATIVE Final   Influenza B by PCR NEGATIVE NEGATIVE Final    Comment: (NOTE) The Xpert Xpress SARS-CoV-2/FLU/RSV plus assay is intended as an aid in the diagnosis of influenza from Nasopharyngeal swab specimens and should not be used as a sole basis for treatment. Nasal washings and aspirates are unacceptable for Xpert Xpress SARS-CoV-2/FLU/RSV testing.  Fact Sheet for Patients: EntrepreneurPulse.com.au  Fact Sheet for Healthcare Providers: IncredibleEmployment.be  This test is not yet approved or cleared by the [place] FDA and has been authorized for detection and/or diagnosis of SARS-CoV-2 by FDA under an Emergency Use Authorization (EUA). This EUA will remain in effect (meaning this test can be used) for the duration of the COVID-19 declaration  under Section 564(b)(1) of the Act, 21 U.S.C. section 360bbb-3(b)(1), unless the authorization is terminated or revoked.  Performed at [facility], [place] [address] [place], [place] [address]   Urine culture     Status: Abnormal   Collection Time: 08/23/20 10:57 AM   Specimen: In/Out Cath Urine  Result Value Ref Range Status   Specimen Description   Final    IN/OUT CATH URINE Performed at [facility] [address] [place], [place] [address]    Special Requests   Final    NONE Performed at [facility], [place] [address], [place],  [address]    Culture MULTIPLE SPECIES PRESENT, SUGGEST RECOLLECTION (A)  Final   Report Status 08/24/2020 FINAL  Final  Blood Culture (routine x 2)     Status: None (Preliminary result)   Collection Time: 08/23/20 11:02 AM   Specimen: BLOOD  Result Value Ref Range Status   Specimen Description   Final    BLOOD BLOOD RIGHT FOREARM Performed at [place] [address], [place], [place] [address]    Special Requests   Final    BOTTLES DRAWN AEROBIC AND ANAEROBIC Blood Culture adequate volume Performed at [place] [address], [place], [place] [address]    Culture   Final    NO GROWTH 3 DAYS Performed at [facility], [place] [address], [place], [place] [address]    Report Status PENDING  Incomplete  Blood Culture (routine x 2)     Status: None (Preliminary result)   Collection Time: 08/23/20 11:24 AM   Specimen: BLOOD  Result Value Ref Range Status   Specimen Description   Final    BLOOD RIGHT ANTECUBITAL Performed at [place] [address] [place], [place] [address]    Special Requests   Final    BOTTLES DRAWN AEROBIC AND ANAEROBIC Blood Culture adequate volume Performed at [place] [address] [place], [place] [address]    Culture   Final    NO GROWTH 3 DAYS Performed  at [facility], [place] [address], [place], [place] [address]    Report Status PENDING  Incomplete  Culture, Urine     Status: None   Collection Time: 08/24/20  7:48 PM   Specimen: Urine, Random  Result Value Ref Range Status   Specimen Description   Final    URINE, RANDOM Performed at [place] [address], [place], [place] [address]    Special Requests   Final    NONE Performed at [facility], [place] [address], [place], [place] [address]    Culture   Final    NO GROWTH Performed at [facility], [place] [address], [place], [place] [address]    Report Status 08/26/2020 FINAL  Final  Respiratory (~20 pathogens) panel by PCR     Status: None   Collection Time: 08/26/20 12:05 PM   Specimen: Nasopharyngeal Swab; Respiratory  Result Value Ref Range Status   Adenovirus NOT DETECTED NOT DETECTED Final   Coronavirus 229E NOT DETECTED NOT DETECTED Final    Comment: (NOTE) The Coronavirus on the Respiratory Panel, DOES NOT test for the novel  Coronavirus (2019 nCoV)    Coronavirus HKU1 NOT DETECTED NOT DETECTED Final   Coronavirus NL63 NOT DETECTED NOT DETECTED Final   Coronavirus OC43 NOT DETECTED NOT DETECTED Final   Metapneumovirus NOT DETECTED NOT DETECTED Final   Rhinovirus / Enterovirus NOT DETECTED NOT DETECTED Final   Influenza A NOT DETECTED NOT DETECTED Final   Influenza B NOT DETECTED NOT DETECTED Final   Parainfluenza Virus 1 NOT DETECTED NOT DETECTED Final   Parainfluenza Virus 2 NOT DETECTED NOT DETECTED Final   Parainfluenza Virus 3 NOT DETECTED NOT DETECTED Final   Parainfluenza Virus 4 NOT DETECTED NOT DETECTED Final   Respiratory Syncytial Virus NOT DETECTED NOT DETECTED Final   Bordetella pertussis NOT DETECTED NOT DETECTED Final   Bordetella Parapertussis NOT DETECTED NOT DETECTED Final   Chlamydophila pneumoniae NOT DETECTED NOT DETECTED Final   Mycoplasma pneumoniae NOT DETECTED NOT DETECTED Final     Comment: Performed at [facility], [place] [address] [place], [place] [address]      Studies:  DG CHEST PORT 1 VIEW  Result Date: 08/25/2020 CLINICAL DATA:  Chest congestion. EXAM: PORTABLE CHEST  1 VIEW COMPARISON:  08/23/2020 FINDINGS: Stable appearance of the left subclavian Port-A-Cath with the tip near the superior cavoatrial junction. Lungs are clear. Heart and mediastinum are within normal limits and stable. Again noted are surgical clips in the left axilla and left breast. Trachea is midline. IMPRESSION: No active disease. Electronically Signed   By: Markus Daft M.D.   On: 08/25/2020 09:54    Assessment: 55 y.o. with history of hypertension, anxiety, breast cancer stage II, on adjuvant chemotherapy, presented with severe abdominal pain and neutropenic fever.  1.  Neutropenic fever, UTI, blood and urine culture negative  2.  Abdominal pain, improved  3.  Stage IIa breast cancer, on adjuvant chemotherapy 4. HTN 5. Anxiety  6. Chest tightness and cough, cxr(-)   Plan -She is overall doing better, if no more fever tonight, probably okay to discharge tomorrow. -Her next chemo is scheduled within 2 weeks, hopefully she will recover well next week.  We discussed possibility of postponing her next chemo if she needs more time to recover. -We will likely reduce her chemo dose and increase supportive care after next cycle chemo  Truitt Merle, MD  08/26/2020

## 2020-08-26 NOTE — Progress Notes (Signed)
PROGRESS NOTE    Sherry Carrillo  ZJQ:[id number] DOB: 1965/06/25 DOA: 08/23/2020 PCP: Vivi Barrack, MD   Brief Narrative:  The patient is a 55 year old obese female with a past medical history significant for but not limited to hypertension, anxiety, history of stage IIa breast history currently on adjuvant chemotherapy as well as other comorbidities who presented with abdominal pain.  She reported that she had chemotherapy 5 days ago and was doing well until yesterday.  She noted to have umbilical pain that was sharp and constant was a 9 out of 10 in severity.  She states that she tried a heating pad which helped some but did not give her complete relief.  She denies any changes in her stool and denied any nausea or vomiting.  Because the pain persisted she became concerned and came to the ED.  She is found to be febrile and neutropenic and was admitted for febrile neutropenia and started on IV Zosyn but this has been escalated to IV vancomycin and IV cefepime and Flagyl given her continued fevers.  TRH was consulted for admission and medical oncology was consulted for assistance in management.  Patient continues to feel extremely weak but thinks she is improving and her main concern again was her chest congestion and feels as if she has a cold now.  Her LFTs became abnormal but started improving now.  Assessment & Plan:   Active Problems:   Febrile neutropenia (HCC)  Febrile Neutropenia, improving slowly -Patient presented with a Temp of 101.2 and a WBC of 1.9 and ANC of 1000; her [place] now [address] and WBC is 45.5; she has not been febrile overnight and her T-max was 100.2 in the last 24 hours and states that she is discharged feeling bad last night when her temperature was increasing -Likely 2/2 to suspected UTI but could be secondary to pneumonia -U/A done and showed Hazy appearance, Large Leukocytes, Rare Bacteria, 0-5 Non Squamous Epithelial Cells, 0-5 RBC/HPF, 6-10 Squamous Epithelial Cell/HPF  and >50 WBC and unfortunately no urine culture was sent so we will order 1 now and this is still pending -CT Abdomen and Pelvis done and showed "No acute abnormality seen in the abdomen pelvis" -CXR done and showed "Mild hypoentilation with mild bibasilar atelectasis. No definite pneumonia or effusion. Negative for heart failure. Interval placement of left jugular Port-A-Cath with the tip in the SVC." -Blood Cx x2 showing NGTD at 3 Days -Heme/Onc consulted for further evaluation and Recommendations -LA went from 1.6 -> 1.3 -Empiric Abx with IV Zosyn changed to IV cefepime and Flagyl plus IV vancomycin given her continued fevers; we will check a respiratory virus panel given her chest congestion -CXR today done and was read as no active disease and  showed "Stable appearance of the left subclavian Port-A-Cath with the tip near the superior cavoatrial junction. Lungs are clear. Heart and mediastinum are within normal limits and stable. Again noted are surgical clips in the left axilla and left   breast. Trachea is midline."  -Appreciate oncology evaluation  Abdominal Pain, intermittent and improved now -Admitted to Inpatient Telemetry -Lipase Level was Normal; LFTs were worsening but now slightly improving now -CT Abdomen and Pelvis done and as above  -Could be secondary to GERD so we will continue PPI but have increased at the twice daily now and added Tums  Chest Congestion -Says she has pain in her chest when she coughs significantl and is unable to produce sputum -Start Flutter Valve, Incentive Spirometry,  and Add Guaifenesin 1200 mg po BID  -CXR as above -Chaning Abx as above as well -Given IV Ketorolac 15 mg x1 for MSK pain from coughing  -We will check a respiratory virus panel given her cold-like symptoms  Hyponatremia -Mild and Improved. Patient's Na+ went from 134 -> 136 -> 135 and today is 138 -Continue to Monitor and Trend and repeat CMP in the AM   Metabolic  Acidosis -Mild -Patient CO2 is not 20, anion gap is 8, chloride level is 110 -Continue to monitor and trend repeat CMP in the a.m.  Stage IIa Breast Cancer currently on Chemotherapy -Patient started adjuvant chemotherapy last week -Per oncology she received G-CSF last week after her chemotherapy -Oncology considering slight dose reduction in next 3 remaining cycles-supportive care  HTN -BP is well controlled -She takes lisinopril-hydrochlorothiazide 20-12.5 mg once a day -Continue to Monitor BP per Protocol -Last BP was 145/68  Anxiety  -Reported to be no longer taking Venlafaxine 37.5 milligrams p.o. twice daily  GERD -Continue with Pantoprazole 40 mg p.o. daily but will increase to BID  Thrombocytopenia, improving  -Patient's Platelet Count went from 145 -> 153 -> 188 -> 197 -Continue to Monitor and Trend  Normocytic Anemia -Patient's hemoglobin/hematocrit dropped from 12.1/37.7 and is now 11.4/36.8 -Check anemia panel in the a.m. -Continue to monitor for signs and symptoms of bleeding; currently no overt bleeding noted -Repeat CBC in the AM  Abnormal LFT's -Patient's AST went from 22 -> 69 -> 39 -> 37 and ALT went from 32 -> 76 -> 69 ->53 -Likely reactive and ? Abx Related -Continue to Monitor and Trend Hepatic Fxn Panel and if not improving or worsening will consider obtaining a RUQ U/S and an Acute Hepatitis Panel -Repeat CMP in the AM   Obesity -Complicates overall prognosis and care -Estimated body mass index is 31.25 kg/m as calculated from the following:   Height as of this encounter: 5' (1.524 m).   Weight as of this encounter: 72.6 kg. -Weight Loss and Dietary Counseling given   DVT prophylaxis: SCDs Code Status: FULL CODE  Family Communication: Discussed with Husband at bedside  Disposition Plan: Pending further clinical improvement and clearance by Heme/Onc  Status is: Inpatient  Remains inpatient appropriate because:Unsafe d/c plan, IV treatments  appropriate due to intensity of illness or inability to take PO and Inpatient level of care appropriate due to severity of illness   Dispo: The patient is from: Home              Anticipated d/c is to: Home              Patient currently is not medically stable to d/c.   Difficult to place patient No  Consultants:   Medical Oncology   Procedures: None  Antimicrobials:  Anti-infectives (From admission, onward)   Start     Dose/Rate Route Frequency Ordered Stop   08/26/20 1200  vancomycin (VANCOREADY) IVPB 1000 mg/200 mL        1,000 mg 200 mL/hr over 60 Minutes Intravenous Every 24 hours 08/25/20 1023     08/25/20 1400  ceFEPIme (MAXIPIME) 2 g in sodium chloride 0.9 % 100 mL IVPB        2 g 200 mL/hr over 30 Minutes Intravenous Every 8 hours 08/25/20 0943     08/25/20 1400  metroNIDAZOLE (FLAGYL) IVPB 500 mg        500 mg 100 mL/hr over 60 Minutes Intravenous Every 8 hours 08/25/20 0943  08/25/20 1030  vancomycin (VANCOREADY) IVPB 1500 mg/300 mL        1,500 mg 150 mL/hr over 120 Minutes Intravenous  Once 08/25/20 0944 08/25/20 1326   08/23/20 2200  piperacillin-tazobactam (ZOSYN) IVPB 3.375 g  Status:  Discontinued        3.375 g 12.5 mL/hr over 240 Minutes Intravenous Every 8 hours 08/23/20 1520 08/25/20 0913   08/23/20 1530  piperacillin-tazobactam (ZOSYN) IVPB 3.375 g        3.375 g 100 mL/hr over 30 Minutes Intravenous  Once 08/23/20 1518 08/23/20 1613   08/23/20 1245  piperacillin-tazobactam (ZOSYN) IVPB 3.375 g  Status:  Discontinued        3.375 g 100 mL/hr over 30 Minutes Intravenous  Once 08/23/20 1240 08/23/20 1517        Subjective: Seen and examined at bedside and thinks overall that she is doing better than she came in.  States that her abdominal pain and discomfort has improved significantly.  Still has this chest discomfort and chest congestion and states it is very hard for her to cough up sputum but states it is slowly breaking up and had now starting.   States that she had an elevated temperature last night and started feeling better.  No nausea or vomiting.  Hoping to ambulate today.  Thinks that the room is making her sick and thinks it is too cold in her room.  No other concerns or complaints at this time.  Objective: Vitals:   08/25/20 1958 08/25/20 2054 08/25/20 2210 08/26/20 0443  BP: (!) 149/76   (!) 145/68  Pulse: (!) 110   99  Resp: 14   16  Temp: 100.2 F (37.9 C) 98.6 F (37 C) 98.5 F (36.9 C) 98.1 F (36.7 C)  TempSrc: Oral Oral Oral Oral  SpO2: 96%   95%  Weight:      Height:        Intake/Output Summary (Last 24 hours) at 08/26/2020 1152 Last data filed at 08/26/2020 4680 Gross per 24 hour  Intake 922.97 ml  Output --  Net 922.97 ml   Filed Weights   08/23/20 1001  Weight: 72.6 kg   Examination: Physical Exam:  Constitutional: WN/WD obese female in NAD and appears anxious but comfortable Eyes: Lids and conjunctivae normal, sclerae anicteric  ENMT: External Ears, Nose appear normal. Grossly normal hearing. Neck: Appears normal, supple, no cervical masses, normal ROM, no appreciable thyromegaly; no JVD Respiratory: Diminished to auscultation bilaterally, no wheezing, rales, rhonchi or crackles. Normal respiratory effort and patient is not tachypenic. No accessory muscle use. Unlabored breathing  Cardiovascular: RRR, no murmurs / rubs / gallops. S1 and S2 auscultated. Trace edema Abdomen: Soft, non-tender, Distended 2/2 body habitus. Bowel sounds positive.  GU: Deferred. Musculoskeletal: No clubbing / cyanosis of digits/nails. No joint deformity upper and lower extremities.  Skin: No rashes, lesions, ulcers on a limited skin evaluation. No induration; Warm and dry.  Neurologic: CN 2-12 grossly intact with no focal deficits. Romberg sign and cerebellar reflexes not assessed.  Psychiatric: Normal judgment and insight. Alert and oriented x 3. Anxious mood and appropriate affect.   Data Reviewed: I have personally  reviewed following labs and imaging studies  CBC: Recent Labs  Lab 08/23/20 1245 08/23/20 1355 08/24/20 0519 08/24/20 0859 08/25/20 0803 08/26/20 0519  WBC  --  1.5* 2.2* DUP 13.3* 45.5*  NEUTROABS 1.0*  --  0.4* PENDING 6.9 29.2*  HGB  --  12.1 12.0 DUP 12.1 11.4*  HCT  --  37.2 37.3 DUP 37.7 36.8  MCV  --  86.3 87.1 DUP 85.9 89.1  PLT  --  145* 153 DUP 188 681   Basic Metabolic Panel: Recent Labs  Lab 08/23/20 1007 08/23/20 1456 08/24/20 0519 08/25/20 0803 08/26/20 0519  NA 134* 136 135 135 138  K 4.5 4.4 4.6 4.1 4.2  CL 99 102 102 102 110  CO2 27 26 28 25  20*  GLUCOSE 111* 103* 121* 107* 109*  BUN 15 13 16 13 12   CREATININE 0.78 0.66 0.89 0.80 0.78  CALCIUM 9.3 9.0 8.6* 8.7* 8.6*  MG  --   --   --  2.0 1.7  PHOS  --   --   --  3.2 3.9   GFR: Estimated Creatinine Clearance: 70.6 mL/min (by C-G formula based on SCr of 0.78 mg/dL). Liver Function Tests: Recent Labs  Lab 08/23/20 1007 08/23/20 1456 08/24/20 0519 08/25/20 0803 08/26/20 0519  AST 29 22 69* 39 37  ALT 39 32 76* 69* 53*  ALKPHOS 96 81 82 91 107  BILITOT 0.4 0.9 0.5 0.5 0.4  PROT 7.6 6.8 6.6 6.4* 5.9*  ALBUMIN 3.9 3.6 3.4* 3.1* 3.0*   Recent Labs  Lab 08/23/20 1007  LIPASE 28   No results for input(s): AMMONIA in the last 168 hours. Coagulation Profile: Recent Labs  Lab 08/23/20 1043  INR 1.0   Cardiac Enzymes: No results for input(s): CKTOTAL, CKMB, CKMBINDEX, TROPONINI in the last 168 hours. BNP (last 3 results) No results for input(s): PROBNP in the last 8760 hours. HbA1C: No results for input(s): HGBA1C in the last 72 hours. CBG: No results for input(s): GLUCAP in the last 168 hours. Lipid Profile: No results for input(s): CHOL, HDL, LDLCALC, TRIG, CHOLHDL, LDLDIRECT in the last 72 hours. Thyroid Function Tests: No results for input(s): TSH, T4TOTAL, FREET4, T3FREE, THYROIDAB in the last 72 hours. Anemia Panel: No results for input(s): VITAMINB12, FOLATE, FERRITIN, TIBC,  IRON, RETICCTPCT in the last 72 hours. Sepsis Labs: Recent Labs  Lab 08/23/20 1124 08/23/20 1355  LATICACIDVEN 1.6 1.3    Recent Results (from the past 240 hour(s))  Resp Panel by RT-PCR (Flu A&B, Covid) Nasopharyngeal Swab     Status: None   Collection Time: 08/23/20 10:45 AM   Specimen: Nasopharyngeal Swab; Nasopharyngeal(NP) swabs in vial transport medium  Result Value Ref Range Status   SARS Coronavirus 2 by RT PCR NEGATIVE NEGATIVE Final    Comment: (NOTE) SARS-CoV-2 target nucleic acids are NOT DETECTED.  The SARS-CoV-2 RNA is generally detectable in upper respiratory specimens during the acute phase of infection. The lowest concentration of SARS-CoV-2 viral copies this assay can detect is 138 copies/mL. A negative result does not preclude SARS-Cov-2 infection and should not be used as the sole basis for treatment or other patient management decisions. A negative result may occur with  improper specimen collection/handling, submission of specimen other than nasopharyngeal swab, presence of viral mutation(s) within the areas targeted by this assay, and inadequate number of viral copies(<138 copies/mL). A negative result must be combined with clinical observations, patient history, and epidemiological information. The expected result is Negative.  Fact Sheet for Patients:  EntrepreneurPulse.com.au  Fact Sheet for Healthcare Providers:  IncredibleEmployment.be  This test is no t yet approved or cleared by the [place] FDA and  has been authorized for detection and/or diagnosis of SARS-CoV-2 by FDA under an Emergency Use Authorization (EUA). This EUA will remain  in effect (meaning this test can be used) for  the duration of the COVID-19 declaration under Section 564(b)(1) of the Act, 21 U.S.C.section 360bbb-3(b)(1), unless the authorization is terminated  or revoked sooner.       Influenza A by PCR NEGATIVE NEGATIVE Final    Influenza B by PCR NEGATIVE NEGATIVE Final    Comment: (NOTE) The Xpert Xpress SARS-CoV-2/FLU/RSV plus assay is intended as an aid in the diagnosis of influenza from Nasopharyngeal swab specimens and should not be used as a sole basis for treatment. Nasal washings and aspirates are unacceptable for Xpert Xpress SARS-CoV-2/FLU/RSV testing.  Fact Sheet for Patients: EntrepreneurPulse.com.au  Fact Sheet for Healthcare Providers: IncredibleEmployment.be  This test is not yet approved or cleared by the [place] FDA and has been authorized for detection and/or diagnosis of SARS-CoV-2 by FDA under an Emergency Use Authorization (EUA). This EUA will remain in effect (meaning this test can be used) for the duration of the COVID-19 declaration under Section 564(b)(1) of the Act, 21 U.S.C. section 360bbb-3(b)(1), unless the authorization is terminated or revoked.  Performed at [facility], [place] [address], [place], [place] [address]   Urine culture     Status: Abnormal   Collection Time: 08/23/20 10:57 AM   Specimen: In/Out Cath Urine  Result Value Ref Range Status   Specimen Description   Final    IN/OUT CATH URINE Performed at [place] [address] [place], [place] [address]    Special Requests   Final    NONE Performed at [facility], [place] [address], [place], [place] [address]    Culture MULTIPLE SPECIES PRESENT, SUGGEST RECOLLECTION (A)  Final   Report Status 08/24/2020 FINAL  Final  Blood Culture (routine x 2)     Status: None (Preliminary result)   Collection Time: 08/23/20 11:02 AM   Specimen: BLOOD  Result Value Ref Range Status   Specimen Description   Final    BLOOD BLOOD RIGHT FOREARM Performed at [place] [address], [place], [place] [address]    Special Requests   Final    BOTTLES DRAWN AEROBIC AND ANAEROBIC Blood Culture adequate  volume Performed at [place] [address], [place], [place] [address]    Culture   Final    NO GROWTH 3 DAYS Performed at [facility], [place] [address], [place], [place] [address]    Report Status PENDING  Incomplete  Blood Culture (routine x 2)     Status: None (Preliminary result)   Collection Time: 08/23/20 11:24 AM   Specimen: BLOOD  Result Value Ref Range Status   Specimen Description   Final    BLOOD RIGHT ANTECUBITAL Performed at [place] [address], [place], [place] [address]    Special Requests   Final    BOTTLES DRAWN AEROBIC AND ANAEROBIC Blood Culture adequate volume Performed at [place] [address], [place], [place] [address]    Culture   Final    NO GROWTH 3 DAYS Performed at [facility], [place] [address], [place], [place] [address]    Report Status PENDING  Incomplete  Culture, Urine     Status: None   Collection Time: 08/24/20  7:48 PM   Specimen: Urine, Random  Result Value Ref Range Status   Specimen Description   Final    URINE, RANDOM Performed at [place] [address], [place],  [address]    Special Requests  Final    NONE Performed at [facility], [place] [address] [place], [place] [address]    Culture   Final    NO GROWTH Performed at [facility], [place] [address] [place], [place] [address]    Report Status 08/26/2020 FINAL  Final     RN Pressure Injury Documentation:     Estimated body mass index is 31.25 kg/m as calculated from the following:   Height as of this encounter: 5' (1.524 m).   Weight as of this encounter: 72.6 kg.  Malnutrition Type:   Malnutrition Characteristics:   Nutrition Interventions:     Radiology Studies: DG CHEST PORT 1 VIEW  Result Date: 08/25/2020 CLINICAL DATA:  Chest congestion. EXAM: PORTABLE CHEST 1 VIEW COMPARISON:  08/23/2020 FINDINGS: Stable appearance  of the left subclavian Port-A-Cath with the tip near the superior cavoatrial junction. Lungs are clear. Heart and mediastinum are within normal limits and stable. Again noted are surgical clips in the left axilla and left breast. Trachea is midline. IMPRESSION: No active disease. Electronically Signed   By: Markus Daft M.D.   On: 08/25/2020 09:54   Scheduled Meds: . calcium carbonate  1 tablet Oral BID  . guaiFENesin  1,200 mg Oral BID  . pantoprazole  40 mg Oral BID   Continuous Infusions: . ceFEPime (MAXIPIME) IV 200 mL/hr at 08/26/20 0619  . metronidazole Stopped (08/26/20 0604)  . vancomycin 1,000 mg (08/26/20 1126)    LOS: 3 days   Kerney Elbe, DO [facility] PAGER is on [place]  If 7PM-7AM, please contact night-coverage [email or web address]

## 2020-08-27 LAB — CBC WITH DIFFERENTIAL/PLATELET
Abs Immature Granulocytes: 0.5 10*3/uL — ABNORMAL HIGH (ref 0.00–0.07)
Basophils Absolute: 0 10*3/uL (ref 0.0–0.1)
Basophils Relative: 0 %
Eosinophils Absolute: 0 10*3/uL (ref 0.0–0.5)
Eosinophils Relative: 0 %
HCT: 34.2 % — ABNORMAL LOW (ref 36.0–46.0)
Hemoglobin: 11.1 g/dL — ABNORMAL LOW (ref 12.0–15.0)
Lymphocytes Relative: 11 %
Lymphs Abs: 5.3 10*3/uL — ABNORMAL HIGH (ref 0.7–4.0)
MCH: 28.2 pg (ref 26.0–34.0)
MCHC: 32.5 g/dL (ref 30.0–36.0)
MCV: 86.8 fL (ref 80.0–100.0)
Monocytes Absolute: 0.5 10*3/uL (ref 0.1–1.0)
Monocytes Relative: 1 %
Myelocytes: 1 %
Neutro Abs: 41.8 10*3/uL — ABNORMAL HIGH (ref 1.7–7.7)
Neutrophils Relative %: 87 %
Platelets: 199 10*3/uL (ref 150–400)
RBC: 3.94 MIL/uL (ref 3.87–5.11)
RDW: 13.2 % (ref 11.5–15.5)
WBC Morphology: ABNORMAL
WBC: 48 10*3/uL — ABNORMAL HIGH (ref 4.0–10.5)
nRBC: 0.1 % (ref 0.0–0.2)

## 2020-08-27 LAB — PHOSPHORUS: Phosphorus: 3.6 mg/dL (ref 2.5–4.6)

## 2020-08-27 LAB — COMPREHENSIVE METABOLIC PANEL
ALT: 51 U/L — ABNORMAL HIGH (ref 0–44)
AST: 35 U/L (ref 15–41)
Albumin: 3 g/dL — ABNORMAL LOW (ref 3.5–5.0)
Alkaline Phosphatase: 117 U/L (ref 38–126)
Anion gap: 6 (ref 5–15)
BUN: 10 mg/dL (ref 6–20)
CO2: 23 mmol/L (ref 22–32)
Calcium: 8.3 mg/dL — ABNORMAL LOW (ref 8.9–10.3)
Chloride: 109 mmol/L (ref 98–111)
Creatinine, Ser: 0.65 mg/dL (ref 0.44–1.00)
GFR, Estimated: >60 mL/min (ref >60)
Glucose, Bld: 121 mg/dL — ABNORMAL HIGH (ref 70–99)
Potassium: 3.7 mmol/L (ref 3.5–5.1)
Sodium: 138 mmol/L (ref 135–145)
Total Bilirubin: 0.3 mg/dL (ref 0.3–1.2)
Total Protein: 5.9 g/dL — ABNORMAL LOW (ref 6.5–8.1)

## 2020-08-27 LAB — MAGNESIUM: Magnesium: 2.2 mg/dL (ref 1.7–2.4)

## 2020-08-27 MED ORDER — CALCIUM CARBONATE ANTACID 500 MG PO CHEW: 1.0000 | CHEWABLE_TABLET | Freq: Two times a day (BID) | ORAL | 0 refills | Status: DC

## 2020-08-27 MED ORDER — GUAIFENESIN ER 600 MG PO TB12: 600.0000 mg | ORAL_TABLET | Freq: Two times a day (BID) | ORAL | 0 refills | Status: AC

## 2020-08-27 MED ORDER — ACETAMINOPHEN 325 MG PO TABS: 650.0000 mg | ORAL_TABLET | Freq: Four times a day (QID) | ORAL | 0 refills | Status: DC | PRN

## 2020-08-27 MED ORDER — HEPARIN SOD (PORK) LOCK FLUSH 100 UNIT/ML IV SOLN
500.0000 [IU] | Freq: Once | INTRAVENOUS | Status: AC
  Administered 2020-08-27: 500 [IU] via INTRAVENOUS
  Filled 2020-08-27: qty 5

## 2020-08-27 MED ORDER — PANTOPRAZOLE SODIUM 40 MG PO TBEC: 40.0000 mg | DELAYED_RELEASE_TABLET | Freq: Every day | ORAL | 0 refills | Status: DC

## 2020-08-27 NOTE — Discharge Summary (Signed)
Physician Discharge Summary  Sherry Carrillo SAY:[id number] DOB: 01-29-1966 DOA: 08/23/2020  PCP: Sherry Barrack, MD  Admit date: 08/23/2020 Discharge date: 08/27/2020  Admitted From: Home Disposition: Home  Recommendations for Outpatient Follow-up:  1. Follow up with PCP in 1-2 weeks 2. Follow up with Medical Oncology within 1-2 weeks 3. Please obtain CMP/CBC, Mag, Phos in one week 4. Please follow up on the following pending results:  Home Health: No Equipment/Devices: None  Discharge Condition: Stable  CODE STATUS: FULL CODE Diet recommendation: Heart Healthy Diet  Brief/Interim Summary: The patient is a 55 year old obese female with a past medical history significant for but not limited to hypertension, anxiety, history of stage IIa breast history currently on adjuvant chemotherapy as well as other comorbidities who presented with abdominal pain.  She reported that she had chemotherapy 5 days ago and was doing well until yesterday.  She noted to have umbilical pain that was sharp and constant was a 9 out of 10 in severity.  She states that she tried a heating pad which helped some but did not give her complete relief.  She denies any changes in her stool and denied any nausea or vomiting.  Because the pain persisted she became concerned and came to the ED.  She is found to be febrile and neutropenic and was admitted for febrile neutropenia and started on IV Zosyn but this has been escalated to IV vancomycin and IV cefepime and Flagyl given her continued fevers.  TRH was consulted for admission and medical oncology was consulted for assistance in management.  Patient continues to feel extremely weak but thinks she is improving and her main concern again was her chest congestion and feels as if she has a cold now.  Her LFTs became abnormal but started improving now.  Her respiratory virus panel was negative and her symptoms improved and she felt close to baseline.  She was deemed medically stable  to be discharged and follow-up with her PCP as well as her oncologist in the outpatient setting and have further discussions about reducing her chemotherapy dose.  Discharge Diagnoses:  Active Problems:   Febrile neutropenia (HCC)  Febrile Neutropenia, improving slowly -Patient presented with a Temp of 101.2 and a WBC of 1.9 and ANC of 1000; her [place] now [address] and WBC is 48.0 in the setting of G-CSF last week; she has not been febrile overnight and her T-max 98.9 -Likely 2/2 to suspected UTI but could be secondary to pneumonia -U/A done and showed Hazy appearance, Large Leukocytes, Rare Bacteria, 0-5 Non Squamous Epithelial Cells, 0-5 RBC/HPF, 6-10 Squamous Epithelial Cell/HPF and >50 WBC and unfortunately no urine culture was sent so we will order 1 now and this is still pending -CT Abdomen and Pelvis done and showed "No acute abnormality seen in the abdomen pelvis" -CXR done and showed "Mild hypoentilation with mild bibasilar atelectasis. No definite pneumonia or effusion. Negative for heart failure. Interval placement of left jugular Port-A-Cath with the tip in the SVC." -Blood Cx x2 showing NGTD at 4 Days -Heme/Onc consulted for further evaluation and Recommendations -LA went from 1.6 -> 1.3 -Empiric Abx with IV Zosyn changed to IV cefepime and Flagyl plus IV vancomycin given her continued fevers; we will check a respiratory virus panel given her chest congestion and this was Negative  -CXR today done and was read as no active disease and  showed "Stable appearance of the left subclavian Port-A-Cath with the tip near the superior cavoatrial junction. Lungs are  clear. Heart and mediastinum are within normal limits and stable. Again noted are surgical clips in the left axilla and left   breast. Trachea is midline."  -Appreciate oncology evaluation and will follow up as an outpatient   Abdominal Pain, intermittent and improved now -Admitted to Inpatient Telemetry -Lipase Level was Normal;  LFTs were worsening but now slightly improving now -CT Abdomen and Pelvis done and as above  -Could be secondary to GERD so we will continue PPI but have increased at the twice daily now and added Tums  Chest Congestion -Says she has pain in her chest when she coughs significantl and is unable to produce sputum -Start Flutter Valve, Incentive Spirometry, and Add Guaifenesin 1200 mg po BID  -CXR as above -Chaning Abx as above as well -Given IV Ketorolac 15 mg x1 for MSK pain from coughing  -We will check a respiratory virus panel given her cold-like symptoms and this was Neative -CXR improved   Hyponatremia -Mild and Improved. Patient's Na+ went from 134 -> 136 -> 135 and today is 138 again -Continue to Monitor and Trend and repeat CMP in the AM   Metabolic Acidosis -Mild -Patient CO2 is now 23, anion gap is 10, chloride level is 109 -Continue to monitor and trend repeat CMP in the a.m.  Stage IIa Breast Cancer currently on Chemotherapy -Patient started adjuvant chemotherapy last week -Per oncology she received G-CSF last week after her chemotherapy -Oncology considering slight dose reduction in next 3 remaining cycles-supportive care  HTN -BP is well controlled -She takes lisinopril-hydrochlorothiazide 20-12.5 mg once a day -Continue to Monitor BP per Protocol -Last BP was 139/87  Anxiety  -Reported to be no longer taking Venlafaxine 37.5 milligrams p.o. twice daily  GERD -Continue with Pantoprazole 40 mg p.o. daily but will increase to BID  Thrombocytopenia, improving  -Patient's Platelet Count went from 145 -> 153 -> 188 -> 197 -> 199 -Continue to Monitor and Trend  Normocytic Anemia -Patient's hemoglobin/hematocrit dropped from 12.1/37.7 and is now 11.1/34.2 -Check anemia panel as an outpatient  -Continue to monitor for signs and symptoms of bleeding; currently no overt bleeding noted -Repeat CBC in the AM  Abnormal LFT's -Patient's AST went from 22 ->  69 -> 39 -> 37 -> 35 and ALT went from 32 -> 76 -> 69 ->53 -> 51 -Likely reactive and ? Abx Related -Continue to Monitor and Trend Hepatic Fxn Panel and if not improving or worsening will consider obtaining a RUQ U/S and an Acute Hepatitis Panel -Repeat CMP in the AM   Obesity -Complicates overall prognosis and care -Estimated body mass index is 31.25 kg/m as calculated from the following:   Height as of this encounter: 5' (1.524 m).   Weight as of this encounter: 72.6 kg. -Weight Loss and Dietary Counseling given   Discharge Instructions  Discharge Instructions    Call MD for:  difficulty breathing, headache or visual disturbances   Complete by: As directed    Call MD for:  extreme fatigue   Complete by: As directed    Call MD for:  hives   Complete by: As directed    Call MD for:  persistant dizziness or light-headedness   Complete by: As directed    Call MD for:  persistant nausea and vomiting   Complete by: As directed    Call MD for:  redness, tenderness, or signs of infection (pain, swelling, redness, odor or green/yellow discharge around incision site)   Complete by: As directed  Call MD for:  severe uncontrolled pain   Complete by: As directed    Call MD for:  temperature >100.4   Complete by: As directed    Diet - low sodium heart healthy   Complete by: As directed    Discharge instructions   Complete by: As directed    You were cared for by a hospitalist during your hospital stay. If you have any questions about your discharge medications or the care you received while you were in the hospital after you are discharged, you can call the unit and ask to speak with the hospitalist on call if the hospitalist that took care of you is not available. Once you are discharged, your primary care physician will handle any further medical issues. Please note that NO REFILLS for any discharge medications will be authorized once you are discharged, as it is imperative that you  return to your primary care physician (or establish a relationship with a primary care physician if you do not have one) for your aftercare needs so that they can reassess your need for medications and monitor your lab values.  Follow up with PCP and Medical Oncology within 1-2 weeks. Take all medications as prescribed. If symptoms change or worsen please return to the ED for evaluation   Increase activity slowly   Complete by: As directed      Allergies as of 08/27/2020      Reactions   Sulfa Antibiotics Nausea Only      Medication List    STOP taking these medications   venlafaxine 37.5 MG tablet Commonly known as: EFFEXOR     TAKE these medications   acetaminophen 325 MG tablet Commonly known as: TYLENOL Take 2 tablets (650 mg total) by mouth every 6 (six) hours as needed for mild pain (or Fever >/= 101).   calcium carbonate 500 MG chewable tablet Commonly known as: TUMS - dosed in mg elemental calcium Chew 1 tablet (200 mg of elemental calcium total) by mouth 2 (two) times daily.   dexamethasone 4 MG tablet Commonly known as: DECADRON Take 1 tablet (4 mg total) by mouth 2 (two) times daily. Start the day before Taxotere. Then again 1 tab daily the day after chemo for 3 days.   guaiFENesin 600 MG 12 hr tablet Commonly known as: MUCINEX Take 1 tablet (600 mg total) by mouth 2 (two) times daily for 5 days.   lidocaine-prilocaine cream Commonly known as: EMLA Apply to affected area as needed What changed:   how much to take  how to take this  when to take this  reasons to take this  additional instructions   lisinopril-hydrochlorothiazide 20-12.5 MG tablet Commonly known as: ZESTORETIC Take 1 tablet by mouth once daily   ondansetron 8 MG tablet Commonly known as: Zofran Take 1 tablet (8 mg total) by mouth 2 (two) times daily as needed for refractory nausea / vomiting. Start on day 3 after chemo.   pantoprazole 40 MG tablet Commonly known as: PROTONIX Take 1  tablet (40 mg total) by mouth daily.   prochlorperazine 10 MG tablet Commonly known as: COMPAZINE Take 1 tablet (10 mg total) by mouth every 6 (six) hours as needed (Nausea or vomiting).       Allergies  Allergen Reactions  . Sulfa Antibiotics Nausea Only    Consultations:  Medical Oncology  Procedures/Studies: CT ABDOMEN PELVIS WO CONTRAST  Result Date: 08/23/2020 CLINICAL DATA:  Abdominal pain, fever. EXAM: CT ABDOMEN AND PELVIS WITHOUT CONTRAST  TECHNIQUE: Multidetector CT imaging of the abdomen and pelvis was performed following the standard protocol without IV contrast. COMPARISON:  July 20, 2020. FINDINGS: Lower chest: No acute abnormality. Hepatobiliary: No focal liver abnormality is seen. No gallstones, gallbladder wall thickening, or biliary dilatation. Pancreas: Unremarkable. No pancreatic ductal dilatation or surrounding inflammatory changes. Spleen: Normal in size without focal abnormality. Adrenals/Urinary Tract: Adrenal glands are unremarkable. Kidneys are normal, without renal calculi, focal lesion, or hydronephrosis. Bladder is unremarkable. Stomach/Bowel: Stomach is within normal limits. Appendix appears normal. No evidence of bowel wall thickening, distention, or inflammatory changes. Vascular/Lymphatic: No significant vascular findings are present. No enlarged abdominal or pelvic lymph nodes. Reproductive: Uterus and bilateral adnexa are unremarkable. Other: No abdominal wall hernia or abnormality. No abdominopelvic ascites. Musculoskeletal: No acute or significant osseous findings. IMPRESSION: No acute abnormality seen in the abdomen pelvis. Electronically Signed   By: Marijo Conception M.D.   On: 08/23/2020 14:53   DG CHEST PORT 1 VIEW  Result Date: 08/25/2020 CLINICAL DATA:  Chest congestion. EXAM: PORTABLE CHEST 1 VIEW COMPARISON:  08/23/2020 FINDINGS: Stable appearance of the left subclavian Port-A-Cath with the tip near the superior cavoatrial junction. Lungs are clear.  Heart and mediastinum are within normal limits and stable. Again noted are surgical clips in the left axilla and left breast. Trachea is midline. IMPRESSION: No active disease. Electronically Signed   By: Markus Daft M.D.   On: 08/25/2020 09:54   DG Chest Port 1 View  Result Date: 08/23/2020 CLINICAL DATA:  Question sepsis EXAM: PORTABLE CHEST 1 VIEW COMPARISON:  05/15/2019 FINDINGS: Mild hypoventilation with mild bibasilar atelectasis. No definite pneumonia or effusion. Negative for heart failure. Interval placement of left jugular Port-A-Cath with the tip in the SVC. IMPRESSION: Hypoventilation with mild bibasilar atelectasis. Electronically Signed   By: Franchot Gallo M.D.   On: 08/23/2020 12:18      Subjective:   Discharge Exam: Vitals:   08/26/20 2113 08/27/20 0540  BP: 120/75 139/87  Pulse: 88 80  Resp: 18 14  Temp: 98.9 F (37.2 C) 98.1 F (36.7 C)  SpO2: 94% 95%   Vitals:   08/26/20 1509 08/26/20 1657 08/26/20 2113 08/27/20 0540  BP: 132/81  120/75 139/87  Pulse: 100  88 80  Resp: 16  18 14   Temp: 98 F (36.7 C) 97.9 F (36.6 C) 98.9 F (37.2 C) 98.1 F (36.7 C)  TempSrc: Oral Oral Oral Oral  SpO2: 94%  94% 95%  Weight:      Height:       General: Pt is alert, awake, not in acute distress Cardiovascular: RRR, S1/S2 +, no rubs, no gallops Respiratory: Mildly diminished bilaterally, no wheezing, no rhonchi; unlabored breathing and not wearing a supplemental oxygen via nasal cannula Abdominal: Soft, NT, distended secondary body habitus, bowel sounds + Extremities: Minimal edema, no cyanosis  The results of significant diagnostics from this hospitalization (including imaging, microbiology, ancillary and laboratory) are listed below for reference.    Microbiology: Recent Results (from the past 240 hour(s))  Resp Panel by RT-PCR (Flu A&B, Covid) Nasopharyngeal Swab     Status: None   Collection Time: 08/23/20 10:45 AM   Specimen: Nasopharyngeal Swab;  Nasopharyngeal(NP) swabs in vial transport medium  Result Value Ref Range Status   SARS Coronavirus 2 by RT PCR NEGATIVE NEGATIVE Final    Comment: (NOTE) SARS-CoV-2 target nucleic acids are NOT DETECTED.  The SARS-CoV-2 RNA is generally detectable in upper respiratory specimens during the acute phase of infection.  The lowest concentration of SARS-CoV-2 viral copies this assay can detect is 138 copies/mL. A negative result does not preclude SARS-Cov-2 infection and should not be used as the sole basis for treatment or other patient management decisions. A negative result may occur with  improper specimen collection/handling, submission of specimen other than nasopharyngeal swab, presence of viral mutation(s) within the areas targeted by this assay, and inadequate number of viral copies(<138 copies/mL). A negative result must be combined with clinical observations, patient history, and epidemiological information. The expected result is Negative.  Fact Sheet for Patients:  EntrepreneurPulse.com.au  Fact Sheet for Healthcare Providers:  IncredibleEmployment.be  This test is no t yet approved or cleared by the [place] FDA and  has been authorized for detection and/or diagnosis of SARS-CoV-2 by FDA under an Emergency Use Authorization (EUA). This EUA will remain  in effect (meaning this test can be used) for the duration of the COVID-19 declaration under Section 564(b)(1) of the Act, 21 U.S.C.section 360bbb-3(b)(1), unless the authorization is terminated  or revoked sooner.       Influenza A by PCR NEGATIVE NEGATIVE Final   Influenza B by PCR NEGATIVE NEGATIVE Final    Comment: (NOTE) The Xpert Xpress SARS-CoV-2/FLU/RSV plus assay is intended as an aid in the diagnosis of influenza from Nasopharyngeal swab specimens and should not be used as a sole basis for treatment. Nasal washings and aspirates are unacceptable for Xpert Xpress  SARS-CoV-2/FLU/RSV testing.  Fact Sheet for Patients: EntrepreneurPulse.com.au  Fact Sheet for Healthcare Providers: IncredibleEmployment.be  This test is not yet approved or cleared by the [place] FDA and has been authorized for detection and/or diagnosis of SARS-CoV-2 by FDA under an Emergency Use Authorization (EUA). This EUA will remain in effect (meaning this test can be used) for the duration of the COVID-19 declaration under Section 564(b)(1) of the Act, 21 U.S.C. section 360bbb-3(b)(1), unless the authorization is terminated or revoked.  Performed at [facility], [place] [address], [place], [place] [address]   Urine culture     Status: Abnormal   Collection Time: 08/23/20 10:57 AM   Specimen: In/Out Cath Urine  Result Value Ref Range Status   Specimen Description   Final    IN/OUT CATH URINE Performed at [place] [address], [place], [place] [address]    Special Requests   Final    NONE Performed at [facility], [place] [address], [place], [place] [address]    Culture MULTIPLE SPECIES PRESENT, SUGGEST RECOLLECTION (A)  Final   Report Status 08/24/2020 FINAL  Final  Blood Culture (routine x 2)     Status: None (Preliminary result)   Collection Time: 08/23/20 11:02 AM   Specimen: BLOOD  Result Value Ref Range Status   Specimen Description   Final    BLOOD BLOOD RIGHT FOREARM Performed at [place] [address] [place], [place] [address]    Special Requests   Final    BOTTLES DRAWN AEROBIC AND ANAEROBIC Blood Culture adequate volume Performed at [facility] [address] [place], [place] [address]    Culture   Final    NO GROWTH 4 DAYS Performed at [facility], [place] [address], [place], [place] [address]    Report Status PENDING  Incomplete  Blood Culture (routine x 2)     Status: None (Preliminary result)    Collection Time: 08/23/20 11:24 AM   Specimen: BLOOD  Result Value Ref  Range Status   Specimen Description   Final    BLOOD RIGHT ANTECUBITAL Performed at [facility] [address] [place], [place] [address]    Special Requests   Final    BOTTLES DRAWN AEROBIC AND ANAEROBIC Blood Culture adequate volume Performed at [place] [address] [place], [place] [address]    Culture   Final    NO GROWTH 4 DAYS Performed at [facility], [place] [address] [place], [place] [address]    Report Status PENDING  Incomplete  Culture, Urine     Status: None   Collection Time: 08/24/20  7:48 PM   Specimen: Urine, Random  Result Value Ref Range Status   Specimen Description   Final    URINE, RANDOM Performed at [facility] [address] [place], [place] [address]    Special Requests   Final    NONE Performed at [facility], [place] [address], [place], [place] [address]    Culture   Final    NO GROWTH Performed at [facility], [place] [address], [place], [place] [address]    Report Status 08/26/2020 FINAL  Final  Respiratory (~20 pathogens) panel by PCR     Status: None   Collection Time: 08/26/20 12:05 PM   Specimen: Nasopharyngeal Swab; Respiratory  Result Value Ref Range Status   Adenovirus NOT DETECTED NOT DETECTED Final   Coronavirus 229E NOT DETECTED NOT DETECTED Final    Comment: (NOTE) The Coronavirus on the Respiratory Panel, DOES NOT test for the novel  Coronavirus (2019 nCoV)    Coronavirus HKU1 NOT DETECTED NOT DETECTED Final   Coronavirus NL63 NOT DETECTED NOT DETECTED Final   Coronavirus OC43 NOT DETECTED NOT DETECTED Final   Metapneumovirus NOT DETECTED NOT DETECTED Final   Rhinovirus / Enterovirus NOT DETECTED NOT DETECTED Final   Influenza A NOT DETECTED NOT DETECTED Final   Influenza B NOT DETECTED NOT DETECTED Final   Parainfluenza Virus 1 NOT DETECTED NOT DETECTED  Final   Parainfluenza Virus 2 NOT DETECTED NOT DETECTED Final   Parainfluenza Virus 3 NOT DETECTED NOT DETECTED Final   Parainfluenza Virus 4 NOT DETECTED NOT DETECTED Final   Respiratory Syncytial Virus NOT DETECTED NOT DETECTED Final   Bordetella pertussis NOT DETECTED NOT DETECTED Final   Bordetella Parapertussis NOT DETECTED NOT DETECTED Final   Chlamydophila pneumoniae NOT DETECTED NOT DETECTED Final   Mycoplasma pneumoniae NOT DETECTED NOT DETECTED Final    Comment: Performed at [facility], [place] [address] [place], [place] [address]    Labs: BNP (last 3 results) No results for input(s): BNP in the last 8760 hours. Basic Metabolic Panel: Recent Labs  Lab 08/23/20 1456 08/24/20 0519 08/25/20 0803 08/26/20 0519 08/27/20 0537  NA 136 135 135 138 138  K 4.4 4.6 4.1 4.2 3.7  CL 102 102 102 110 109  CO2 26 28 25  20* 23  GLUCOSE 103* 121* 107* 109* 121*  BUN 13 16 13 12 10   CREATININE 0.66 0.89 0.80 0.78 0.65  CALCIUM 9.0 8.6* 8.7* 8.6* 8.3*  MG  --   --  2.0 1.7 2.2  PHOS  --   --  3.2 3.9 3.6   Liver Function Tests: Recent Labs  Lab 08/23/20 1456 08/24/20 0519 08/25/20 0803 08/26/20 0519 08/27/20 0537  AST 22 69* 39 37 35  ALT 32 76* 69* 53* 51*  ALKPHOS 81 82 91 107 117  BILITOT 0.9 0.5 0.5 0.4 0.3  PROT 6.8 6.6 6.4* 5.9* 5.9*  ALBUMIN 3.6 3.4* 3.1* 3.0* 3.0*   Recent Labs  Lab 08/23/20 1007  LIPASE 28   No results for input(s): AMMONIA in the last 168 hours. CBC: Recent Labs  Lab 08/24/20 0519 08/24/20 0859 08/25/20 0803 08/26/20 0519 08/27/20 0537  WBC 2.2* DUP 13.3* 45.5* 48.0*  NEUTROABS 0.4* PENDING 6.9 29.2* 41.8*  HGB 12.0 DUP 12.1 11.4* 11.1*  HCT 37.3 DUP 37.7 36.8 34.2*  MCV 87.1 DUP 85.9 89.1 86.8  PLT 153 DUP 188 197 199   Cardiac Enzymes: No results for input(s): CKTOTAL, CKMB, CKMBINDEX, TROPONINI in the last 168 hours. BNP: Invalid input(s): POCBNP CBG: No results for input(s): GLUCAP in the last 168 hours. D-Dimer No  results for input(s): DDIMER in the last 72 hours. Hgb A1c No results for input(s): HGBA1C in the last 72 hours. Lipid Profile No results for input(s): CHOL, HDL, LDLCALC, TRIG, CHOLHDL, LDLDIRECT in the last 72 hours. Thyroid function studies No results for input(s): TSH, T4TOTAL, T3FREE, THYROIDAB in the last 72 hours.  Invalid input(s): FREET3 Anemia work up No results for input(s): VITAMINB12, FOLATE, FERRITIN, TIBC, IRON, RETICCTPCT in the last 72 hours. Urinalysis    Component Value Date/Time   COLORURINE YELLOW 08/23/2020 1124   APPEARANCEUR HAZY (A) 08/23/2020 1124   LABSPEC 1.024 08/23/2020 1124   PHURINE 6.0 08/23/2020 1124   GLUCOSEU NEGATIVE 08/23/2020 1124   HGBUR NEGATIVE 08/23/2020 1124   BILIRUBINUR NEGATIVE 08/23/2020 1124   KETONESUR NEGATIVE 08/23/2020 1124   PROTEINUR NEGATIVE 08/23/2020 1124   UROBILINOGEN 0.2 02/04/2007 0049   NITRITE NEGATIVE 08/23/2020 1124   LEUKOCYTESUR LARGE (A) 08/23/2020 1124   Sepsis Labs Invalid input(s): PROCALCITONIN,  WBC,  LACTICIDVEN Microbiology Recent Results (from the past 240 hour(s))  Resp Panel by RT-PCR (Flu A&B, Covid) Nasopharyngeal Swab     Status: None   Collection Time: 08/23/20 10:45 AM   Specimen: Nasopharyngeal Swab; Nasopharyngeal(NP) swabs in vial transport medium  Result Value Ref Range Status   SARS Coronavirus 2 by RT PCR NEGATIVE NEGATIVE Final    Comment: (NOTE) SARS-CoV-2 target nucleic acids are NOT DETECTED.  The SARS-CoV-2 RNA is generally detectable in upper respiratory specimens during the acute phase of infection. The lowest concentration of SARS-CoV-2 viral copies this assay can detect is 138 copies/mL. A negative result does not preclude SARS-Cov-2 infection and should not be used as the sole basis for treatment or other patient management decisions. A negative result may occur with  improper specimen collection/handling, submission of specimen other than nasopharyngeal swab, presence of  viral mutation(s) within the areas targeted by this assay, and inadequate number of viral copies(<138 copies/mL). A negative result must be combined with clinical observations, patient history, and epidemiological information. The expected result is Negative.  Fact Sheet for Patients:  EntrepreneurPulse.com.au  Fact Sheet for Healthcare Providers:  IncredibleEmployment.be  This test is no t yet approved or cleared by the [place] FDA and  has been authorized for detection and/or diagnosis of SARS-CoV-2 by FDA under an Emergency Use Authorization (EUA). This EUA will remain  in effect (meaning this test can be used) for the duration of the COVID-19 declaration under Section 564(b)(1) of the Act, 21 U.S.C.section 360bbb-3(b)(1), unless the authorization is terminated  or revoked sooner.       Influenza A by PCR NEGATIVE NEGATIVE Final   Influenza B by PCR NEGATIVE NEGATIVE Final    Comment: (NOTE) The Xpert  Xpress SARS-CoV-2/FLU/RSV plus assay is intended as an aid in the diagnosis of influenza from Nasopharyngeal swab specimens and should not be used as a sole basis for treatment. Nasal washings and aspirates are unacceptable for Xpert Xpress SARS-CoV-2/FLU/RSV testing.  Fact Sheet for Patients: EntrepreneurPulse.com.au  Fact Sheet for Healthcare Providers: IncredibleEmployment.be  This test is not yet approved or cleared by the [place] FDA and has been authorized for detection and/or diagnosis of SARS-CoV-2 by FDA under an Emergency Use Authorization (EUA). This EUA will remain in effect (meaning this test can be used) for the duration of the COVID-19 declaration under Section 564(b)(1) of the Act, 21 U.S.C. section 360bbb-3(b)(1), unless the authorization is terminated or revoked.  Performed at [facility], [place] [address] [place], [place] [address]   Urine culture      Status: Abnormal   Collection Time: 08/23/20 10:57 AM   Specimen: In/Out Cath Urine  Result Value Ref Range Status   Specimen Description   Final    IN/OUT CATH URINE Performed at [facility] [address] [place], [place] [address]    Special Requests   Final    NONE Performed at [facility], [place] [address], [place], [place] [address]    Culture MULTIPLE SPECIES PRESENT, SUGGEST RECOLLECTION (A)  Final   Report Status 08/24/2020 FINAL  Final  Blood Culture (routine x 2)     Status: None (Preliminary result)   Collection Time: 08/23/20 11:02 AM   Specimen: BLOOD  Result Value Ref Range Status   Specimen Description   Final    BLOOD BLOOD RIGHT FOREARM Performed at [facility] [address], [place], [place] [address]    Special Requests   Final    BOTTLES DRAWN AEROBIC AND ANAEROBIC Blood Culture adequate volume Performed at [facility] [address] [place], [place] [address]    Culture   Final    NO GROWTH 4 DAYS Performed at [facility], [place] [address] [place], [place] [address]    Report Status PENDING  Incomplete  Blood Culture (routine x 2)     Status: None (Preliminary result)   Collection Time: 08/23/20 11:24 AM   Specimen: BLOOD  Result Value Ref Range Status   Specimen Description   Final    BLOOD RIGHT ANTECUBITAL Performed at [facility] [address], [place], [place] [address]    Special Requests   Final    BOTTLES DRAWN AEROBIC AND ANAEROBIC Blood Culture adequate volume Performed at [place] [address], [place], [place] [address]    Culture   Final    NO GROWTH 4 DAYS Performed at [facility], [place] [address] [place], [place] [address]    Report Status PENDING  Incomplete  Culture, Urine     Status: None   Collection Time: 08/24/20  7:48 PM   Specimen: Urine, Random  Result Value Ref Range Status    Specimen Description   Final    URINE, RANDOM Performed at [facility] [address] [place], [place] [address]    Special Requests   Final    NONE Performed at [facility], [place] [address], [place], [place] [address]    Culture   Final    NO GROWTH Performed at [facility], [place] [address] [place], [place] [address]    Report Status 08/26/2020 FINAL  Final  Respiratory (~20 pathogens) panel by PCR     Status: None   Collection Time: 08/26/20 12:05 PM   Specimen: Nasopharyngeal Swab; Respiratory  Result Value Ref Range Status   Adenovirus NOT DETECTED NOT DETECTED Final   Coronavirus 229E NOT DETECTED NOT DETECTED Final    Comment: (NOTE) The Coronavirus on the Respiratory Panel, DOES NOT test for the novel  Coronavirus (2019 nCoV)    Coronavirus HKU1 NOT DETECTED NOT DETECTED Final   Coronavirus NL63 NOT DETECTED NOT DETECTED Final   Coronavirus OC43 NOT DETECTED NOT DETECTED Final   Metapneumovirus NOT DETECTED NOT DETECTED Final   Rhinovirus / Enterovirus NOT DETECTED NOT DETECTED Final   Influenza A NOT DETECTED NOT DETECTED Final   Influenza B NOT DETECTED NOT DETECTED Final   Parainfluenza Virus 1 NOT DETECTED NOT DETECTED Final   Parainfluenza Virus 2 NOT DETECTED NOT DETECTED Final   Parainfluenza Virus 3 NOT DETECTED NOT DETECTED Final   Parainfluenza Virus 4 NOT DETECTED NOT DETECTED Final   Respiratory Syncytial Virus NOT DETECTED NOT DETECTED Final   Bordetella pertussis NOT DETECTED NOT DETECTED Final   Bordetella Parapertussis NOT DETECTED NOT DETECTED Final   Chlamydophila pneumoniae NOT DETECTED NOT DETECTED Final   Mycoplasma pneumoniae NOT DETECTED NOT DETECTED Final    Comment: Performed at [facility], [address] [place], [place] [address]   Time coordinating discharge: 35 minutes  SIGNED:  Kerney Elbe, DO [facility] 08/27/2020, 5:56 PM Pager is on [place]  If 7PM-7AM, please  contact night-coverage [email or web address]

## 2020-08-27 NOTE — Progress Notes (Signed)
Pt discharged home with all belongings. Discharge education completed. No questions or concerns at this time. Encouraged pt to follow up with PCP if any issues following discharge.

## 2020-08-28 LAB — CULTURE, BLOOD (ROUTINE X 2)
Culture: NO GROWTH
Culture: NO GROWTH
Special Requests: ADEQUATE
Special Requests: ADEQUATE

## 2020-08-29 ENCOUNTER — Telehealth: Payer: Self-pay

## 2020-08-29 ENCOUNTER — Telehealth: Payer: Self-pay | Admitting: *Deleted

## 2020-08-29 NOTE — Telephone Encounter (Signed)
Received call from patient stating she is not sure she can endure anymore chemo.  She stated she had a terrible time with the 1st one and ended up in the hospital and had a terrible experience there.  She states she is starting to feel better and is aware of her appt with Dr. Burr Medico on 6/16. I encouraged her to discuss her concerns with her regarding treatment at that visit. She had multiple complaints regarding her care at the hospital and with her 1st chemo tx. Informed her I would let our nurse manager know about her 1st experience for chemo. Encouraged her to call with any other concerns or questions.

## 2020-08-29 NOTE — Telephone Encounter (Cosign Needed)
Transition Care Management Unsuccessful Follow-up Telephone Call  Date of discharge and from where:  08/29/20  Attempts:  2nd Attempt  Reason for unsuccessful TCM follow-up call:  Left voice message

## 2020-09-07 NOTE — Progress Notes (Addendum)
[facility]   [phone] [phone]   Clinic Follow up Note   Patient Care Team: Vivi Barrack, MD as PCP - General (Family Medicine) Mauro Kaufmann, RN as Oncology Nurse Navigator Rockwell Germany, RN as Oncology Nurse Navigator [name], MD as Consulting Physician (General Surgery) Kyung Rudd, MD as Consulting Physician (Radiation Oncology) Sherry Merle, MD as Consulting Physician (Hematology)  Date of Service:  09/08/2020  CHIEF COMPLAINT: f/u of left breast cancer  SUMMARY OF ONCOLOGIC HISTORY: Oncology History Overview Note  Cancer Staging Malignant neoplasm of upper-outer quadrant of left breast, estrogen receptor positive ([place]) Staging form: Breast, AJCC 8th Edition - Clinical stage from 05/31/2020: Stage IIA (cT2, cN0, cM0, G3, ER+, PR+, HER2-) - Signed by Sherry Merle, MD on 06/01/2020 Stage prefix: Initial diagnosis    Malignant neoplasm of upper-outer quadrant of left breast, estrogen receptor positive ([place])  05/17/2020 Mammogram   IMPRESSION: 1. Highly suspicious 2.3 cm mass involving the UPPER OUTER QUADRANT of the LEFT breast at the 1 o'clock position approximately 8 cm from the nipple. 2. No pathologic LEFT axillary lymphadenopathy. 3. No mammographic evidence of malignancy involving the RIGHT breast.   05/17/2020 Initial Biopsy   Diagnosis Breast, left, needle core biopsy, 1 o'clock - INVASIVE MAMMARY CARCINOMA.  Microscopic Comment The carcinoma appears grade 3. The greatest linear extent of tumor in any one core is 10 mm. E-cadherin will be reported separately. Ancillary studies will be reported separately. Results reported to [facility] on 05/18/2020. Dr. Tresa Moore reviewed the case. ADDENDUM: Immunohistochemistry for E-cadherin is positive supporting a ductal phenotype.    05/17/2020 Receptors her2   PROGNOSTIC INDICATORS Results: IMMUNOHISTOCHEMICAL AND MORPHOMETRIC ANALYSIS PERFORMED  MANUALLY The tumor cells are NEGATIVE for Her2 (1+). Estrogen Receptor: 90%, POSITIVE, STRONG STAINING INTENSITY Progesterone Receptor: 60%, POSITIVE, STRONG STAINING INTENSITY Proliferation Marker Ki67: 70%   05/31/2020 Initial Diagnosis   Malignant neoplasm of upper-outer quadrant of left breast, estrogen receptor positive ([place])    05/31/2020 Cancer Staging   Staging form: Breast, AJCC 8th Edition - Clinical stage from 05/31/2020: Stage IIA (cT2, cN0, cM0, G3, ER+, PR+, HER2-) - Signed by Sherry Merle, MD on 06/01/2020  Stage prefix: Initial diagnosis    06/03/2020 Cancer Staging   Staging form: Breast, AJCC 8th Edition - Pathologic stage from 06/03/2020: Stage IIA (pT2, pN1a, cM0, G3, ER+, PR+, HER2-) - Signed by Sherry Merle, MD on 06/29/2020  Stage prefix: Initial diagnosis  Multigene prognostic tests performed: MammaPrint  Histologic grading system: 3 grade system    06/03/2020 Surgery   Left breast lumpectomy with left axillary sentinel lymph node  biopsies  A. 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  - High grade ductal carcinoma in situ.  - Invasive carcinoma is at the inferior margin focally and <0.1 cm from  the anterior margin focally.  - In situ carcinoma is <0.1 cm from the anterior margin focally.  - Biopsy site.  - See oncology table.   B. 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   C. LYMPH NODE, LEFT AXILLARY, #2, SENTINEL, EXCISION:  - One of one lymph nodes negative for carcinoma (0/1).   07/20/2020 Imaging   CT CAP  IMPRESSION: 1. Surgical clips in the area of the LEFT breast laterally along the margin of water density well-circumscribed area that measures approximately 4.2 x 3.7 cm. This is likely  a postoperative seroma. Correlate with any pain or developing symptoms that would suggest infection. No gas or secondary findings to suggest this at  this time. 2. No evidence of metastatic disease in the chest, abdomen or pelvis. 3. Hepatic steatosis without focal lesion. 4. Calcified coronary artery disease. 5. Aortic atherosclerosis. 6. Mildly nodular thyroid. 7. Lesion in the RIGHT thyroid measuring up to 1.8 cm. Recommend thyroid US (ref: J Am Coll Radiol. 2015 Feb;12(2): 143-50).     07/20/2020 Imaging   Bone Scan  IMPRESSION: Signs of spondylosis of the thoracic and lumbar spine. No scintigraphic evidence of metastatic disease.     07/27/2020 Surgery   RE-EXCISION OF LEFT BREAST LUMPECTOMY and PAC placement by Dr Barry Dienes  FINAL MICROSCOPIC DIAGNOSIS:   A. BREAST, LEFT ANTERIOR MARGIN, EXCISION:  - Prior procedure site changes.  No carcinoma identified.   B. BREAST, LEFT INFERIOR MARGIN, EXCISION:  - Prior procedure site changes.  No carcinoma identified.    07/27/2020 Pathology Results   FINAL MICROSCOPIC DIAGNOSIS:   A. BREAST, LEFT ANTERIOR MARGIN, EXCISION:  - Prior procedure site changes.  No carcinoma identified.   B. BREAST, LEFT INFERIOR MARGIN, EXCISION:  - Prior procedure site changes.  No carcinoma identified.     08/18/2020 -  Chemotherapy   docetaxel and Cytoxan (TC) Q3weeks for 4 cycles.        CURRENT THERAPY:  docetaxel and Cytoxan (TC) Q3weeks for 4 cycles starting 08/18/20, dose reduced with C2  INTERVAL HISTORY:  Sherry Carrillo is here for a follow up of breast cancer. She was last seen by me in the office on 08/10/20. I saw her in the hospital during her admission from 08/23/20 to 08/27/20. She presents to the clinic accompanied by her husband. Following her last cycle, she did well for the first 4 days following treatment. She notes Sunday night was when her symptoms started, including increased stomach pain and weakness. She reports feeling much better since discharge. She disliked being in the hospital-- she was given strong pain medicine on an empty stomach, she felt weaker than before. She  reports her pain resolved with discharge, and she went back to eating. She has a history of headaches, previously migraines, for at least the last 30 years. She had been seen by a doctor and tested with no abnormalities. She takes Advil for her headaches, which she notes helps her. She reports a headache today and notes the Dignicap also hurts (but she would not like to stop using it). 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  All other systems were reviewed with the patient and are negative.  MEDICAL HISTORY:  Past Medical History:  Diagnosis Date   Abnormal pap 2009   PT HAD SURGERY BUT DON'T KNOW WHAT TYPE   Breast cancer ([place]) 04/27/2020   Headache(784.0)    otc meds prn   Hypertension    Hyperthyroidism    SVD (spontaneous vaginal delivery)    x 2    SURGICAL HISTORY: Past Surgical History:  Procedure Laterality Date   BREAST LUMPECTOMY WITH RADIOACTIVE SEED AND SENTINEL LYMPH NODE BIOPSY Left 06/03/2020   Procedure: RADIOACTIVE SEED GUIDED LEFT BREAST LUMPECTOMY, LEFT AXILLARY SENTINEL LYMPH NODE BIOPSY;  Surgeon: [name], MD;  Location: [facility];  Service: General;  Laterality: Left;   FOOT SURGERY     left   PORTACATH PLACEMENT Left 07/27/2020   Procedure: INSERTION PORT-A-CATH;  Surgeon: [name], MD;  Location: [facility];  Service: General;  Laterality:  Left;   RE-EXCISION OF BREAST LUMPECTOMY Left 07/27/2020   Procedure: RE-EXCISION OF LEFT BREAST LUMPECTOMY;  Surgeon: [name], MD;  Location: [facility];  Service: General;  Laterality: Left;   TUBAL LIGATION     WISDOM TOOTH EXTRACTION      I have reviewed the social history and family history with the patient and they are unchanged from previous note.  ALLERGIES:  is allergic to sulfa antibiotics.  MEDICATIONS:  Current Outpatient Medications  Medication Sig Dispense Refill   acetaminophen-codeine (TYLENOL #3) 300-30 MG tablet Take 1 tablet by mouth every 8 (eight) hours as needed for moderate pain. 30  tablet 0   acetaminophen (TYLENOL) 325 MG tablet Take 2 tablets (650 mg total) by mouth every 6 (six) hours as needed for mild pain (or Fever >/= 101). 20 tablet 0   calcium carbonate (TUMS - DOSED IN MG ELEMENTAL CALCIUM) 500 MG chewable tablet Chew 1 tablet (200 mg of elemental calcium total) by mouth 2 (two) times daily. 30 tablet 0   dexamethasone (DECADRON) 4 MG tablet Take 1 tablet (4 mg total) by mouth 2 (two) times daily. Start the day before Taxotere. Then again 1 tab daily the day after chemo for 3 days. 30 tablet 1   lidocaine-prilocaine (EMLA) cream Apply to affected area as needed (Patient taking differently: Apply 1 application topically as needed (port access).) 30 g 3   lisinopril-hydrochlorothiazide (ZESTORETIC) 20-12.5 MG tablet Take 1 tablet by mouth once daily (Patient taking differently: Take 1 tablet by mouth daily.) 90 tablet 0   ondansetron (ZOFRAN) 8 MG tablet Take 1 tablet (8 mg total) by mouth 2 (two) times daily as needed for refractory nausea / vomiting. Start on day 3 after chemo. 30 tablet 1   pantoprazole (PROTONIX) 40 MG tablet Take 1 tablet (40 mg total) by mouth daily. 30 tablet 0   prochlorperazine (COMPAZINE) 10 MG tablet Take 1 tablet (10 mg total) by mouth every 6 (six) hours as needed (Nausea or vomiting). 30 tablet 1   No current facility-administered medications for this visit.   Facility-Administered Medications Ordered in Other Visits  Medication Dose Route Frequency Provider Last Rate Last Admin   cyclophosphamide (CYTOXAN) 880 mg in sodium chloride 0.9 % 250 mL chemo infusion  500 mg/m2 (Treatment Plan Recorded) Intravenous Once Sherry Merle, MD       dexamethasone (DECADRON) 10 mg in sodium chloride 0.9 % 50 mL IVPB  10 mg Intravenous Once Sherry Merle, MD       DOCEtaxel (TAXOTERE) 110 mg in sodium chloride 0.9 % 250 mL chemo infusion  60 mg/m2 (Treatment Plan Recorded) Intravenous Once Sherry Merle, MD       heparin lock flush 100 unit/mL  500 Units  Intracatheter Once PRN Sherry Merle, MD       sodium chloride flush (NS) 0.9 % injection 10 mL  10 mL Intracatheter PRN Sherry Merle, MD        PHYSICAL EXAMINATION: ECOG PERFORMANCE STATUS: 1 - Symptomatic but completely ambulatory  Vitals:   09/08/20 1016  BP: (!) 150/90  Pulse: 98  Resp: 18  Temp: 97.8 F (36.6 C)  SpO2: 98%   Filed Weights   09/08/20 1016  Weight: 168 lb 11.2 oz (76.5 kg)    Due to COVID19 we will limit examination to appearance. Patient had no complaints.  GENERAL:alert, no distress and comfortable SKIN: skin color normal, no rashes or significant lesions EYES: normal, Conjunctiva are pink and non-injected, sclera clear  NEURO: alert & oriented x 3 with fluent speech  LABORATORY DATA:  I have reviewed the data as listed CBC Latest Ref Rng & Units 09/08/2020 08/27/2020 08/26/2020  WBC 4.0 - 10.5 K/uL 11.3(H) 48.0(H) 45.5(H)  Hemoglobin 12.0 - 15.0 g/dL 11.3(L) 11.1(L) 11.4(L)  Hematocrit 36.0 - 46.0 % 34.8(L) 34.2(L) 36.8  Platelets 150 - 400 K/uL 478(H) 199 197     CMP Latest Ref Rng & Units 09/08/2020 08/27/2020 08/26/2020  Glucose 70 - 99 mg/dL 177(H) 121(H) 109(H)  BUN 6 - 20 mg/dL _0 Creatinine 0.44 - 1.00 mg/dL 0.73 0.65 0.78  Sodium 135 - 145 mmol/L 139 138 138  Potassium 3.5 - 5.1 mmol/L 3.9 3.7 4.2  Chloride 98 - 111 mmol/L 108 109 110  CO2 22 - 32 mmol/L 23 23 20(L)  Calcium 8.9 - 10.3 mg/dL 9.2 8.3(L) 8.6(L)  Total Protein 6.5 - 8.1 g/dL 6.8 5.9(L) 5.9(L)  Total Bilirubin 0.3 - 1.2 mg/dL 0.2(L) 0.3 0.4  Alkaline Phos 38 - 126 U/L 80 117 107  AST 15 - 41 U/L 14(L) 35 37  ALT 0 - 44 U/L 25 51(H) 53(H)      RADIOGRAPHIC STUDIES: I have personally reviewed the radiological images as listed and agreed with the findings in the report. No results found.   ASSESSMENT & PLAN:  VERTIS BAUDER is a 55 y.o. female with   1. Malignant neoplasm of upper-outer quadrant of left breast, Stage IIA, pT2N1M0, stage IIA, ER+/PR+/HER2-, Grade III,  Mammaprint high risk -She underwent Left breast lumpectomy with left axillary sentinel lymph node biopsies on 06/03/20 under Dr. Barry Dienes. Pathology showed: 2.4 cm invasive ductal carcinoma, grade 3; DCIS; one positive lymph node (1/2); positive inferior margin. Her Mammaprint showed high risk luminal type.  -She underwent re-excision surgery on 07/27/20 with Dr Barry Dienes. Path showed no residual cancer. -Her CT CAP and bone scan from 07/27/20 were negative for distant mets. -To reduce her risk of cancer recurrence, I recommended adjuvant chemo with docetaxel and Cytoxan (TC) Q3weeks for 4 cycles. She did not tolerate cycle 1 well-- she experienced severe stomach pain on day 5 requiring hospitalization. -she has recovered well from her recent hospital admission, abdominal pain resolved  -we had long conversation about management of AEs from chemo and dose adjustment about chemotherapy to make more tolerable.  She is still willing to continue chemo, but very anxious and is very reluctant to take medications.  -Lab reviewed, adequate for treatment.  She will proceed with cycle 2 today with reduced dose. She declined benadryl as it did not work with cycle 1.  I will add on IV Ativan as premedication to help her go through the [place] and chemo today.  2. Symptom management: stomach pain -secondary to TC. She usually takes Advil for her chronic headaches because Tylenol doesn't work for her. -her job is also very stressful, which could be contributing to her headaches and stomach pain. -I advised her to avoid Advil or any NSAIDs given her recent stomach issues after chemo. -I prescribed tylenol 3# today for her to try for moderate or severe pain -she agrees to take Protonix daily after chemo, she will also use Tums as needed.   3. Hot flashes -She was seen to be post-menopausal on 12/2017 labs with [facility] [facility] -She started having hot flashes in Fall 2021. Her gyn started on her Estradial patch in  04/2020 which helps very well. Due to ER/PR positive disease, she stopped in  March.  -I prescribed SSRI with Effexor to help her hot flashes on 06/01/20, but she never started it. She notes she did pick it up. I recommended she start and take once a day, at the same time each day. I think this will also help her headaches, which could be worsened by stress.  After lengthy discussion, she finally agreed to try. -Since she would prefer to avoid medication, I discussed she could try acupuncture.  She will think about it.   4. Social Support -She is married with 2 adult daughters. Her family is aware of her breast cancer diagnosis. -She is working on coping better with diagnosis and chemo.    5. She declined Genetic Testing (08/10/20)     PLAN:  -proceed with C2 TC at reduced dose of docetaxel and Cytoxan due to her poor tolerance to cycle 1 -She will start effexor (previously prescribed) -I prescribed tylenol #3 for her to try for headaches and gastric pain if she has, she will pick up Protonix also. -Lab, flush, and TC on 7/7, 7/28 -Due to severe headache from [place], I will give her IV Ativan 2 mg before she starts. -F/u next Monday for toxicity checkup    No problem-specific Assessment & Plan notes found for this encounter.   No orders of the defined types were placed in this encounter.  All questions were answered. The patient knows to call the clinic with any problems, questions or concerns. No barriers to learning was detected. The total time spent in the appointment was 40 minutes.     Sherry Merle, MD 09/08/2020   I, Wilburn Mylar, am acting as scribe for Sherry Merle, MD.   I have reviewed the above documentation for accuracy and completeness, and I agree with the above.

## 2020-09-08 ENCOUNTER — Encounter: Payer: Self-pay | Admitting: Hematology

## 2020-09-08 ENCOUNTER — Other Ambulatory Visit: Payer: Self-pay

## 2020-09-08 ENCOUNTER — Inpatient Hospital Stay (HOSPITAL_BASED_OUTPATIENT_CLINIC_OR_DEPARTMENT_OTHER): Payer: BC Managed Care – PPO | Admitting: Hematology

## 2020-09-08 ENCOUNTER — Inpatient Hospital Stay: Payer: BC Managed Care – PPO | Attending: Hematology

## 2020-09-08 ENCOUNTER — Inpatient Hospital Stay: Payer: BC Managed Care – PPO

## 2020-09-08 VITALS — BP 150/90 | HR 98 | Temp 97.8°F | Resp 18 | Ht 60.0 in | Wt 168.7 lb

## 2020-09-08 DIAGNOSIS — K760 Fatty (change of) liver, not elsewhere classified: Secondary | ICD-10-CM | POA: Diagnosis not present

## 2020-09-08 DIAGNOSIS — Z170 Estrogen receptor positive status [ER+]: Secondary | ICD-10-CM

## 2020-09-08 DIAGNOSIS — C50412 Malignant neoplasm of upper-outer quadrant of left female breast: Secondary | ICD-10-CM | POA: Diagnosis not present

## 2020-09-08 DIAGNOSIS — E041 Nontoxic single thyroid nodule: Secondary | ICD-10-CM | POA: Diagnosis not present

## 2020-09-08 DIAGNOSIS — I700 Atherosclerosis of aorta: Secondary | ICD-10-CM | POA: Insufficient documentation

## 2020-09-08 DIAGNOSIS — R109 Unspecified abdominal pain: Secondary | ICD-10-CM | POA: Diagnosis not present

## 2020-09-08 DIAGNOSIS — Z95828 Presence of other vascular implants and grafts: Secondary | ICD-10-CM

## 2020-09-08 DIAGNOSIS — Z5111 Encounter for antineoplastic chemotherapy: Secondary | ICD-10-CM | POA: Diagnosis not present

## 2020-09-08 DIAGNOSIS — Z5189 Encounter for other specified aftercare: Secondary | ICD-10-CM | POA: Diagnosis not present

## 2020-09-08 DIAGNOSIS — I2510 Atherosclerotic heart disease of native coronary artery without angina pectoris: Secondary | ICD-10-CM | POA: Insufficient documentation

## 2020-09-08 DIAGNOSIS — R232 Flushing: Secondary | ICD-10-CM | POA: Diagnosis not present

## 2020-09-08 LAB — CMP (CANCER CENTER ONLY)
ALT: 25 U/L (ref 0–44)
AST: 14 U/L — ABNORMAL LOW (ref 15–41)
Albumin: 3.3 g/dL — ABNORMAL LOW (ref 3.5–5.0)
Alkaline Phosphatase: 80 U/L (ref 38–126)
Anion gap: 8 (ref 5–15)
BUN: 16 mg/dL (ref 6–20)
CO2: 23 mmol/L (ref 22–32)
Calcium: 9.2 mg/dL (ref 8.9–10.3)
Chloride: 108 mmol/L (ref 98–111)
Creatinine: 0.73 mg/dL (ref 0.44–1.00)
GFR, Estimated: >60 mL/min (ref >60)
Glucose, Bld: 177 mg/dL — ABNORMAL HIGH (ref 70–99)
Potassium: 3.9 mmol/L (ref 3.5–5.1)
Sodium: 139 mmol/L (ref 135–145)
Total Bilirubin: 0.2 mg/dL — ABNORMAL LOW (ref 0.3–1.2)
Total Protein: 6.8 g/dL (ref 6.5–8.1)

## 2020-09-08 LAB — CBC WITH DIFFERENTIAL (CANCER CENTER ONLY)
Abs Immature Granulocytes: 0.04 10*3/uL (ref 0.00–0.07)
Basophils Absolute: 0 10*3/uL (ref 0.0–0.1)
Basophils Relative: 0 %
Eosinophils Absolute: 0 10*3/uL (ref 0.0–0.5)
Eosinophils Relative: 0 %
HCT: 34.8 % — ABNORMAL LOW (ref 36.0–46.0)
Hemoglobin: 11.3 g/dL — ABNORMAL LOW (ref 12.0–15.0)
Immature Granulocytes: 0 %
Lymphocytes Relative: 12 %
Lymphs Abs: 1.4 10*3/uL (ref 0.7–4.0)
MCH: 27.9 pg (ref 26.0–34.0)
MCHC: 32.5 g/dL (ref 30.0–36.0)
MCV: 85.9 fL (ref 80.0–100.0)
Monocytes Absolute: 0.6 10*3/uL (ref 0.1–1.0)
Monocytes Relative: 5 %
Neutro Abs: 9.3 10*3/uL — ABNORMAL HIGH (ref 1.7–7.7)
Neutrophils Relative %: 83 %
Platelet Count: 478 10*3/uL — ABNORMAL HIGH (ref 150–400)
RBC: 4.05 MIL/uL (ref 3.87–5.11)
RDW: 13.7 % (ref 11.5–15.5)
WBC Count: 11.3 10*3/uL — ABNORMAL HIGH (ref 4.0–10.5)
nRBC: 0 % (ref 0.0–0.2)

## 2020-09-08 MED ORDER — HEPARIN SOD (PORK) LOCK FLUSH 100 UNIT/ML IV SOLN
500.0000 [IU] | Freq: Once | INTRAVENOUS | Status: AC | PRN
  Administered 2020-09-08: 500 [IU]
  Filled 2020-09-08: qty 5

## 2020-09-08 MED ORDER — LORAZEPAM 2 MG/ML IJ SOLN
2.0000 mg | Freq: Once | INTRAMUSCULAR | Status: AC
  Administered 2020-09-08: 2 mg via INTRAVENOUS

## 2020-09-08 MED ORDER — ACETAMINOPHEN-CODEINE #3 300-30 MG PO TABS: 1.0000 | ORAL_TABLET | Freq: Three times a day (TID) | ORAL | 0 refills | Status: DC | PRN

## 2020-09-08 MED ORDER — PALONOSETRON HCL INJECTION 0.25 MG/5ML
0.2500 mg | Freq: Once | INTRAVENOUS | Status: AC
  Administered 2020-09-08: 0.25 mg via INTRAVENOUS

## 2020-09-08 MED ORDER — LORAZEPAM 2 MG/ML IJ SOLN
INTRAMUSCULAR | Status: AC
  Filled 2020-09-08: qty 1

## 2020-09-08 MED ORDER — SODIUM CHLORIDE 0.9% FLUSH
10.0000 mL | Freq: Once | INTRAVENOUS | Status: AC
  Administered 2020-09-08: 10 mL
  Filled 2020-09-08: qty 10

## 2020-09-08 MED ORDER — SODIUM CHLORIDE 0.9 % IV SOLN
10.0000 mg | Freq: Once | INTRAVENOUS | Status: AC
  Administered 2020-09-08: 10 mg via INTRAVENOUS
  Filled 2020-09-08: qty 10

## 2020-09-08 MED ORDER — SODIUM CHLORIDE 0.9 % IV SOLN
60.0000 mg/m2 | Freq: Once | INTRAVENOUS | Status: AC
  Administered 2020-09-08: 110 mg via INTRAVENOUS
  Filled 2020-09-08: qty 11

## 2020-09-08 MED ORDER — SODIUM CHLORIDE 0.9 % IV SOLN
500.0000 mg/m2 | Freq: Once | INTRAVENOUS | Status: AC
  Administered 2020-09-08: 880 mg via INTRAVENOUS
  Filled 2020-09-08: qty 44

## 2020-09-08 MED ORDER — PALONOSETRON HCL INJECTION 0.25 MG/5ML
INTRAVENOUS | Status: AC
  Filled 2020-09-08: qty 5

## 2020-09-08 MED ORDER — SODIUM CHLORIDE 0.9% FLUSH
10.0000 mL | INTRAVENOUS | Status: DC | PRN
  Administered 2020-09-08: 10 mL
  Filled 2020-09-08: qty 10

## 2020-09-08 MED ORDER — SODIUM CHLORIDE 0.9 % IV SOLN
Freq: Once | INTRAVENOUS | Status: AC
  Filled 2020-09-08: qty 250

## 2020-09-08 NOTE — Patient Instructions (Signed)
[facility]  Discharge Instructions: Thank you for choosing [facility] to provide your oncology and hematology care.   If you have a lab appointment with the [facility], please go directly to the [facility] and check in at the registration area.   Wear comfortable clothing and clothing appropriate for easy access to any Portacath or PICC line.   We strive to give you quality time with your provider. You may need to reschedule your appointment if you arrive late (15 or more minutes).  Arriving late affects you and other patients whose appointments are after yours.  Also, if you miss three or more appointments without notifying the office, you may be dismissed from the clinic at the provider's discretion.      For prescription refill requests, have your pharmacy contact our office and allow 72 hours for refills to be completed.    Today you received the following chemotherapy and/or immunotherapy agents Taxotere & Cytoxan      To help prevent nausea and vomiting after your treatment, we encourage you to take your nausea medication as directed.  BELOW ARE SYMPTOMS THAT SHOULD BE REPORTED IMMEDIATELY: *FEVER GREATER THAN 100.4 F (38 C) OR HIGHER *CHILLS OR SWEATING *NAUSEA AND VOMITING THAT IS NOT CONTROLLED WITH YOUR NAUSEA MEDICATION *UNUSUAL SHORTNESS OF BREATH *UNUSUAL BRUISING OR BLEEDING *URINARY PROBLEMS (pain or burning when urinating, or frequent urination) *BOWEL PROBLEMS (unusual diarrhea, constipation, pain near the anus) TENDERNESS IN MOUTH AND THROAT WITH OR WITHOUT PRESENCE OF ULCERS (sore throat, sores in mouth, or a toothache) UNUSUAL RASH, SWELLING OR PAIN  UNUSUAL VAGINAL DISCHARGE OR ITCHING   Items with * indicate a potential emergency and should be followed up as soon as possible or go to the Emergency Department if any problems should occur.  Please show the CHEMOTHERAPY ALERT CARD or IMMUNOTHERAPY ALERT CARD at  check-in to the Emergency Department and triage nurse.  Should you have questions after your visit or need to cancel or reschedule your appointment, please contact [facility]  Dept: [phone]  and follow the prompts.  Office hours are 8:00 a.m. to 4:30 p.m. Monday - Friday. Please note that voicemails left after 4:00 p.m. may not be returned until the following business day.  We are closed weekends and major holidays. You have access to a nurse at all times for urgent questions. Please call the main number to the clinic Dept: [phone] and follow the prompts.   For any non-urgent questions, you may also contact your provider using MyChart. We now offer e-Visits for anyone 4 and older to request care online for non-urgent symptoms. For details visit [email or web address].   Also download the MyChart app! Go to the app store, search "MyChart", open the app, select [facility], and log in with your MyChart username and password.  Due to Covid, a mask is required upon entering the hospital/clinic. If you do not have a mask, one will be given to you upon arrival. For doctor visits, patients may have 1 support person aged 27 or older with them. For treatment visits, patients cannot have anyone with them due to current Covid guidelines and our immunocompromised population.   Docetaxel injection What is this medication? DOCETAXEL (doe se TAX el) is a chemotherapy drug. It targets fast dividing cells, like cancer cells, and causes these cells to die. This medicine is used to treat many types of cancers like breast cancer, certain stomach cancers,head and neck  cancer, lung cancer, and prostate cancer. This medicine may be used for other purposes; ask your health care provider orpharmacist if you have questions. COMMON BRAND NAME(S): Docefrez, Taxotere What should I tell my care team before I take this medication? They need to know if you have any of these  conditions: infection (especially a virus infection such as chickenpox, cold sores, or herpes) liver disease low blood counts, like low white cell, platelet, or red cell counts an unusual or allergic reaction to docetaxel, polysorbate 80, other chemotherapy agents, other medicines, foods, dyes, or preservatives pregnant or trying to get pregnant breast-feeding How should I use this medication? This drug is given as an infusion into a vein. It is administered in a hospitalor clinic by a specially trained health care professional. Talk to your pediatrician regarding the use of this medicine in children.Special care may be needed. Overdosage: If you think you have taken too much of this medicine contact apoison control center or emergency room at once. NOTE: This medicine is only for you. Do not share this medicine with others. What if I miss a dose? It is important not to miss your dose. Call your doctor or health careprofessional if you are unable to keep an appointment. What may interact with this medication? Do not take this medicine with any of the following medications: live virus vaccines This medicine may also interact with the following medications: aprepitant certain antibiotics like erythromycin or clarithromycin certain antivirals for HIV or hepatitis certain medicines for fungal infections like fluconazole, itraconazole, ketoconazole, posaconazole, or voriconazole cimetidine ciprofloxacin conivaptan cyclosporine dronedarone fluvoxamine grapefruit juice imatinib verapamil This list may not describe all possible interactions. Give your health care provider a list of all the medicines, herbs, non-prescription drugs, or dietary supplements you use. Also tell them if you smoke, drink alcohol, or use illegaldrugs. Some items may interact with your medicine. What should I watch for while using this medication? Your condition will be monitored carefully while you are receiving this  medicine. You will need important blood work done while you are taking thismedicine. Call your doctor or health care professional for advice if you get a fever, chills or sore throat, or other symptoms of a cold or flu. Do not treat yourself. This drug decreases your body's ability to fight infections. Try toavoid being around people who are sick. Some products may contain alcohol. Ask your health care professional if this medicine contains alcohol. Be sure to tell all health care professionals you are taking this medicine. Certain medicines, like metronidazole and disulfiram, can cause an unpleasant reaction when taken with alcohol. The reaction includes flushing, headache, nausea, vomiting, sweating, and increased thirst. Thereaction can last from 30 minutes to several hours. You may get drowsy or dizzy. Do not drive, use machinery, or do anything that needs mental alertness until you know how this medicine affects you. Do not stand or sit up quickly, especially if you are an older patient. This reduces the risk of dizzy or fainting spells. Alcohol may interfere with the effect ofthis medicine. Talk to your health care professional about your risk of cancer. You may bemore at risk for certain types of cancer if you take this medicine. Do not become pregnant while taking this medicine or for 6 months after stopping it. Women should inform their doctor if they wish to become pregnant or think they might be pregnant. There is a potential for serious side effects to an unborn child. Talk to your health care professional or  pharmacist for more information. Do not breast-feed an infant while taking this medicine orfor 1 week after stopping it. Males who get this medicine must use a condom during sex with females who can get pregnant. If you get a woman pregnant, the baby could have birth defects. The baby could die before they are born. You will need to continue wearing a condom for 3 months after stopping the  medicine. Tell your health care providerright away if your partner becomes pregnant while you are taking this medicine. This may interfere with the ability to father a child. You should talk to yourdoctor or health care professional if you are concerned about your fertility. What side effects may I notice from receiving this medication? Side effects that you should report to your doctor or health care professionalas soon as possible: allergic reactions like skin rash, itching or hives, swelling of the face, lips, or tongue blurred vision breathing problems changes in vision low blood counts - This drug may decrease the number of white blood cells, red blood cells and platelets. You may be at increased risk for infections and bleeding. nausea and vomiting pain, redness or irritation at site where injected pain, tingling, numbness in the hands or feet redness, blistering, peeling, or loosening of the skin, including inside the mouth signs of decreased platelets or bleeding - bruising, pinpoint red spots on the skin, black, tarry stools, nosebleeds signs of decreased red blood cells - unusually weak or tired, fainting spells, lightheadedness signs of infection - fever or chills, cough, sore throat, pain or difficulty passing urine swelling of the ankle, feet, hands Side effects that usually do not require medical attention (report to yourdoctor or health care professional if they continue or are bothersome): constipation diarrhea fingernail or toenail changes hair loss loss of appetite mouth sores muscle pain This list may not describe all possible side effects. Call your doctor for medical advice about side effects. You may report side effects to FDA at1-800-FDA-1088. Where should I keep my medication? This drug is given in a hospital or clinic and will not be stored at home. NOTE: This sheet is a summary. It may not cover all possible information. If you have questions about this medicine,  talk to your doctor, pharmacist, orhealth care provider.  2022 Elsevier/Gold Standard (2019-02-09 19:50:31)  Cyclophosphamide Injection What is this medication? CYCLOPHOSPHAMIDE (sye kloe FOSS fa mide) is a chemotherapy drug. It slows the growth of cancer cells. This medicine is used to treat many types of cancer like lymphoma, myeloma, leukemia, breast cancer, and ovarian cancer, to name afew. This medicine may be used for other purposes; ask your health care provider orpharmacist if you have questions. COMMON BRAND NAME(S): Cytoxan, Neosar What should I tell my care team before I take this medication? They need to know if you have any of these conditions: heart disease history of irregular heartbeat infection kidney disease liver disease low blood counts, like white cells, platelets, or red blood cells on hemodialysis recent or ongoing radiation therapy scarring or thickening of the lungs trouble passing urine an unusual or allergic reaction to cyclophosphamide, other medicines, foods, dyes, or preservatives pregnant or trying to get pregnant breast-feeding How should I use this medication? This drug is usually given as an injection into a vein or muscle or by infusion into a vein. It is administered in a hospital or clinic by a specially trainedhealth care professional. Talk to your pediatrician regarding the use of this medicine in children.Special care may be  needed. Overdosage: If you think you have taken too much of this medicine contact apoison control center or emergency room at once. NOTE: This medicine is only for you. Do not share this medicine with others. What if I miss a dose? It is important not to miss your dose. Call your doctor or health careprofessional if you are unable to keep an appointment. What may interact with this medication? amphotericin B azathioprine certain antivirals for HIV or hepatitis certain medicines for blood pressure, heart disease, irregular  heart beat certain medicines that treat or prevent blood clots like warfarin certain other medicines for cancer cyclosporine etanercept indomethacin medicines that relax muscles for surgery medicines to increase blood counts metronidazole This list may not describe all possible interactions. Give your health care provider a list of all the medicines, herbs, non-prescription drugs, or dietary supplements you use. Also tell them if you smoke, drink alcohol, or use illegaldrugs. Some items may interact with your medicine. What should I watch for while using this medication? Your condition will be monitored carefully while you are receiving thismedicine. You may need blood work done while you are taking this medicine. Drink water or other fluids as directed. Urinate often, even at night. Some products may contain alcohol. Ask your health care professional if this medicine contains alcohol. Be sure to tell all health care professionals you are taking this medicine. Certain medicines, like metronidazole and disulfiram, can cause an unpleasant reaction when taken with alcohol. The reaction includes flushing, headache, nausea, vomiting, sweating, and increased thirst. Thereaction can last from 30 minutes to several hours. Do not become pregnant while taking this medicine or for 1 year after stopping it. Women should inform their health care professional if they wish to become pregnant or think they might be pregnant. Men should not father a child while taking this medicine and for 4 months after stopping it. There is potential for serious side effects to an unborn child. Talk to your health care professionalfor more information. Do not breast-feed an infant while taking this medicine or for 1 week afterstopping it. This medicine has caused ovarian failure in some women. This medicine may make it more difficult to get pregnant. Talk to your health care professional if [place] concerned about your  fertility. This medicine has caused decreased sperm counts in some men. This may make it more difficult to father a child. Talk to your health care professional if [place] concerned about your fertility. Call your health care professional for advice if you get a fever, chills, or sore throat, or other symptoms of a cold or flu. Do not treat yourself. This medicine decreases your body's ability to fight infections. Try to avoid beingaround people who are sick. Avoid taking medicines that contain aspirin, acetaminophen, ibuprofen, naproxen, or ketoprofen unless instructed by your health care professional.These medicines may hide a fever. Talk to your health care professional about your risk of cancer. You may bemore at risk for certain types of cancer if you take this medicine. If you are going to need surgery or other procedure, tell your health careprofessional that you are using this medicine. Be careful brushing or flossing your teeth or using a toothpick because you may get an infection or bleed more easily. If you have any dental work done, Primary school teacher you are receiving this medicine. What side effects may I notice from receiving this medication? Side effects that you should report to your doctor or health care professionalas soon as possible: allergic reactions like skin  rash, itching or hives, swelling of the face, lips, or tongue breathing problems nausea, vomiting signs and symptoms of bleeding such as bloody or black, tarry stools; red or dark brown urine; spitting up blood or brown material that looks like coffee grounds; red spots on the skin; unusual bruising or bleeding from the eyes, gums, or nose signs and symptoms of heart failure like fast, irregular heartbeat, sudden weight gain; swelling of the ankles, feet, hands signs and symptoms of infection like fever; chills; cough; sore throat; pain or trouble passing urine signs and symptoms of kidney injury like trouble passing urine or  change in the amount of urine signs and symptoms of liver injury like dark yellow or brown urine; general ill feeling or flu-like symptoms; light-colored stools; loss of appetite; nausea; right upper belly pain; unusually weak or tired; yellowing of the eyes or skin Side effects that usually do not require medical attention (report to yourdoctor or health care professional if they continue or are bothersome): confusion decreased hearing diarrhea facial flushing hair loss headache loss of appetite missed menstrual periods signs and symptoms of low red blood cells or anemia such as unusually weak or tired; feeling faint or lightheaded; falls skin discoloration This list may not describe all possible side effects. Call your doctor for medical advice about side effects. You may report side effects to FDA at1-800-FDA-1088. Where should I keep my medication? This drug is given in a hospital or clinic and will not be stored at home. NOTE: This sheet is a summary. It may not cover all possible information. If you have questions about this medicine, talk to your doctor, pharmacist, orhealth care provider.  2022 Elsevier/Gold Standard (2018-12-15 09:53:29)

## 2020-09-08 NOTE — Progress Notes (Signed)
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.  Pt gave me consent to apply in her behalf so I enroll her in the [employer] program for [employer] for $15,000 for 12 months from 09/08/20.  Pt is eligible to have $0 OOP costs for each [employer].  I went over the [employer] and gave her the income requirement.  Pt stated she exceeds the income requirement so she doesn't qualify for the [employer] grant at this time.  She has my card for any questions or concerns she may have in the future.

## 2020-09-09 ENCOUNTER — Telehealth: Payer: Self-pay | Admitting: Hematology

## 2020-09-09 NOTE — Telephone Encounter (Signed)
Scheduled follow-up appointment per 6/16 los. Patient is aware. 

## 2020-09-09 NOTE — Telephone Encounter (Signed)
Scheduled follow-up appointment per 6/17 secure chat. Patient is aware.

## 2020-09-10 ENCOUNTER — Other Ambulatory Visit: Payer: Self-pay

## 2020-09-10 ENCOUNTER — Inpatient Hospital Stay: Payer: BC Managed Care – PPO

## 2020-09-10 VITALS — BP 145/89 | HR 83 | Temp 97.7°F | Resp 18

## 2020-09-10 DIAGNOSIS — Z170 Estrogen receptor positive status [ER+]: Secondary | ICD-10-CM

## 2020-09-10 DIAGNOSIS — C50412 Malignant neoplasm of upper-outer quadrant of left female breast: Secondary | ICD-10-CM

## 2020-09-10 MED ORDER — PEGFILGRASTIM-CBQV 6 MG/0.6ML ~~LOC~~ SOSY
6.0000 mg | PREFILLED_SYRINGE | Freq: Once | SUBCUTANEOUS | Status: AC
  Administered 2020-09-10: 6 mg via SUBCUTANEOUS

## 2020-09-12 ENCOUNTER — Emergency Department (HOSPITAL_COMMUNITY): Payer: BC Managed Care – PPO

## 2020-09-12 ENCOUNTER — Emergency Department (HOSPITAL_COMMUNITY)
Admission: EM | Admit: 2020-09-12 | Discharge: 2020-09-12 | Disposition: A | Discharge status: Home / Self Care | Payer: BC Managed Care – PPO | Attending: Emergency Medicine | Admitting: Emergency Medicine

## 2020-09-12 ENCOUNTER — Inpatient Hospital Stay: Payer: BC Managed Care – PPO

## 2020-09-12 ENCOUNTER — Other Ambulatory Visit: Payer: Self-pay

## 2020-09-12 ENCOUNTER — Encounter (HOSPITAL_COMMUNITY): Payer: Self-pay | Admitting: Emergency Medicine

## 2020-09-12 ENCOUNTER — Inpatient Hospital Stay: Payer: BC Managed Care – PPO | Admitting: Hematology

## 2020-09-12 DIAGNOSIS — I10 Essential (primary) hypertension: Secondary | ICD-10-CM | POA: Diagnosis not present

## 2020-09-12 DIAGNOSIS — Z79899 Other long term (current) drug therapy: Secondary | ICD-10-CM | POA: Insufficient documentation

## 2020-09-12 DIAGNOSIS — R55 Syncope and collapse: Secondary | ICD-10-CM | POA: Insufficient documentation

## 2020-09-12 DIAGNOSIS — I959 Hypotension, unspecified: Secondary | ICD-10-CM | POA: Diagnosis not present

## 2020-09-12 DIAGNOSIS — C50919 Malignant neoplasm of unspecified site of unspecified female breast: Secondary | ICD-10-CM | POA: Insufficient documentation

## 2020-09-12 LAB — CBC WITH DIFFERENTIAL/PLATELET
Abs Immature Granulocytes: 0 10*3/uL (ref 0.00–0.07)
Basophils Absolute: 0 10*3/uL (ref 0.0–0.1)
Basophils Relative: 0 %
Eosinophils Absolute: 0 10*3/uL (ref 0.0–0.5)
Eosinophils Relative: 0 %
HCT: 36.3 % (ref 36.0–46.0)
Hemoglobin: 11.3 g/dL — ABNORMAL LOW (ref 12.0–15.0)
Lymphocytes Relative: 15 %
Lymphs Abs: 1.3 10*3/uL (ref 0.7–4.0)
MCH: 27.8 pg (ref 26.0–34.0)
MCHC: 31.1 g/dL (ref 30.0–36.0)
MCV: 89.2 fL (ref 80.0–100.0)
Monocytes Absolute: 0 10*3/uL — ABNORMAL LOW (ref 0.1–1.0)
Monocytes Relative: 0 %
Neutro Abs: 7.1 10*3/uL (ref 1.7–7.7)
Neutrophils Relative %: 85 %
Platelets: 254 10*3/uL (ref 150–400)
RBC: 4.07 MIL/uL (ref 3.87–5.11)
RDW: 14 % (ref 11.5–15.5)
WBC: 8.4 10*3/uL (ref 4.0–10.5)
nRBC: 0 % (ref 0.0–0.2)
nRBC: 0 /100 WBC

## 2020-09-12 LAB — COMPREHENSIVE METABOLIC PANEL
ALT: 17 U/L (ref 0–44)
AST: 15 U/L (ref 15–41)
Albumin: 3 g/dL — ABNORMAL LOW (ref 3.5–5.0)
Alkaline Phosphatase: 68 U/L (ref 38–126)
Anion gap: 8 (ref 5–15)
BUN: 16 mg/dL (ref 6–20)
CO2: 21 mmol/L — ABNORMAL LOW (ref 22–32)
Calcium: 8.2 mg/dL — ABNORMAL LOW (ref 8.9–10.3)
Chloride: 108 mmol/L (ref 98–111)
Creatinine, Ser: 0.6 mg/dL (ref 0.44–1.00)
GFR, Estimated: >60 mL/min (ref >60)
Glucose, Bld: 78 mg/dL (ref 70–99)
Potassium: 4 mmol/L (ref 3.5–5.1)
Sodium: 137 mmol/L (ref 135–145)
Total Bilirubin: 0.8 mg/dL (ref 0.3–1.2)
Total Protein: 5.7 g/dL — ABNORMAL LOW (ref 6.5–8.1)

## 2020-09-12 LAB — TSH: TSH: 0.349 u[IU]/mL — ABNORMAL LOW (ref 0.350–4.500)

## 2020-09-12 LAB — PHOSPHORUS: Phosphorus: 3 mg/dL (ref 2.5–4.6)

## 2020-09-12 LAB — LIPASE, BLOOD: Lipase: 40 U/L (ref 11–51)

## 2020-09-12 LAB — TROPONIN I (HIGH SENSITIVITY): Troponin I (High Sensitivity): 14 ng/L (ref <18)

## 2020-09-12 LAB — MAGNESIUM: Magnesium: 1.9 mg/dL (ref 1.7–2.4)

## 2020-09-12 LAB — LACTIC ACID, PLASMA: Lactic Acid, Venous: 1.5 mmol/L (ref 0.5–1.9)

## 2020-09-12 MED ORDER — ONDANSETRON HCL 4 MG/2ML IJ SOLN
4.0000 mg | Freq: Once | INTRAMUSCULAR | Status: AC
  Administered 2020-09-12: 4 mg via INTRAVENOUS
  Filled 2020-09-12: qty 2

## 2020-09-12 MED ORDER — SODIUM CHLORIDE 0.9 % IV BOLUS
1000.0000 mL | Freq: Once | INTRAVENOUS | Status: AC
  Administered 2020-09-12: 1000 mL via INTRAVENOUS

## 2020-09-12 NOTE — ED Triage Notes (Signed)
Pt BIB [facility] from home. Complaint of syncopal episode at work, Chemo pt tx Thursday. Bp down to 82/64 with EMS. Lft arm restriction.

## 2020-09-12 NOTE — Discharge Instructions (Addendum)
1.  See your oncologist for recheck within the next 24 to 48 hours. 2.  Stay hydrated.  Drink fluids that also have potassium sodium and sugar in them.  Working large amounts of plain water can drive your sodium level down.  Try to eat nutritious snacks. 3.  If you feel lightheaded or dizzy lie down and raise her legs immediately.  Also, if you develop any concerning symptoms such as shortness of breath, feeling like he will pass out again, chest pain, fever or abdominal pain, return to the emergency department immediately for recheck.

## 2020-09-12 NOTE — ED Provider Notes (Signed)
[facility] EMERGENCY DEPARTMENT Provider Note   CSN: [id number] Arrival date & time: 09/12/20  1042     History Chief Complaint  Patient presents with   Loss of Consciousness    Sherry Carrillo is a 55 y.o. female.  HPI The patient is a 55 year old female with a past medical history significant for but not limited to hypertension, anxiety, history of stage IIa breast history currently on adjuvant chemotherapy.  Patient had a chemotherapy treatment 5 days ago.  She reports she tolerated it well.  She has not been having problems with vomiting or diarrhea.  She denies has been having pain.  She reports she went to work this morning and just started feeling weak and somewhat nauseated.  She reports that she came close to or passed out.  She reports on EMS arrival her blood pressures were low.  She reports she has been eating and drinking although maybe she has not been taking enough fluids.  She has not been running a fever.  She has no positive review of systems for suspected focal infection.  She did try a medication prescribed for hot flashes today.  She reports she is only tried it once before.  Lying in the stretcher, she reports she still feels really weak and generally poorly.  She still denies any focus of pain.     Past Medical History:  Diagnosis Date   Abnormal pap 2009   PT HAD SURGERY BUT DON'T KNOW WHAT TYPE   Breast cancer ([place]) 04/27/2020   Headache(784.0)    otc meds prn   Hypertension    Hyperthyroidism    SVD (spontaneous vaginal delivery)    x 2    Patient Active Problem List   Diagnosis Date Noted   Febrile neutropenia ([place]) 08/23/2020   Port-A-Cath in place 08/18/2020   Malignant neoplasm of upper-outer quadrant of left breast, estrogen receptor positive ([place]) 05/31/2020   Hyperthyroidism 05/16/2020   GERD (gastroesophageal reflux disease) 04/03/2019   Menopause 01/06/2018   Dyslipidemia 09/05/2017   Hyperglycemia 09/05/2017   Abdominal  pain, bilateral upper quadrant 09/03/2017   Pain of finger of left hand 04/16/2017   Headache disorder 09/04/2016   Other fatigue 12/19/2015   Obesity 10/05/2015   Left shoulder pain 03/07/2015   Essential hypertension 03/07/2015    Past Surgical History:  Procedure Laterality Date   BREAST LUMPECTOMY WITH RADIOACTIVE SEED AND SENTINEL LYMPH NODE BIOPSY Left 06/03/2020   Procedure: RADIOACTIVE SEED GUIDED LEFT BREAST LUMPECTOMY, LEFT AXILLARY SENTINEL LYMPH NODE BIOPSY;  Surgeon: [name], MD;  Location: [place];  Service: General;  Laterality: Left;   FOOT SURGERY     left   PORTACATH PLACEMENT Left 07/27/2020   Procedure: INSERTION PORT-A-CATH;  Surgeon: [name], MD;  Location: [place];  Service: General;  Laterality: Left;   RE-EXCISION OF BREAST LUMPECTOMY Left 07/27/2020   Procedure: RE-EXCISION OF LEFT BREAST LUMPECTOMY;  Surgeon: [name], MD;  Location: [place];  Service: General;  Laterality: Left;   TUBAL LIGATION     WISDOM TOOTH EXTRACTION       OB History     Gravida  2   Para      Term      Preterm      AB      Living  2      SAB      IAB      Ectopic      Multiple  Live Births              Family History  Problem Relation Age of Onset   Cancer Mother        LUNG   Hypertension Mother    Diabetes Mother    Heart disease Father    Diabetes Maternal Grandmother    Heart disease Paternal Grandmother    Heart disease Paternal Grandfather    Thyroid disease Sister    Diabetes Brother    Cancer Maternal Aunt 65       breast cancer     Social History   Tobacco Use   Smoking status: Never   Smokeless tobacco: Never  Vaping Use   Vaping Use: Never used  Substance Use Topics   Alcohol use: No   Drug use: No    Home Medications Prior to Admission medications   Medication Sig Start Date End Date Taking? Authorizing Provider  acetaminophen (TYLENOL) 325 MG tablet Take 2 tablets (650 mg total) by mouth every 6 (six) hours  as needed for mild pain (or Fever >/= 101). 08/27/20   Sheikh, Georgina Quint Latif, DO  acetaminophen-codeine (TYLENOL #3) 300-30 MG tablet Take 1 tablet by mouth every 8 (eight) hours as needed for moderate pain. 09/08/20   Truitt Merle, MD  calcium carbonate (TUMS - DOSED IN MG ELEMENTAL CALCIUM) 500 MG chewable tablet Chew 1 tablet (200 mg of elemental calcium total) by mouth 2 (two) times daily. 08/27/20   Sheikh, Omair Latif, DO  dexamethasone (DECADRON) 4 MG tablet Take 1 tablet (4 mg total) by mouth 2 (two) times daily. Start the day before Taxotere. Then again 1 tab daily the day after chemo for 3 days. 08/10/20   Truitt Merle, MD  lidocaine-prilocaine (EMLA) cream Apply to affected area as needed Patient taking differently: Apply 1 application topically as needed (port access). 08/12/20   Truitt Merle, MD  lisinopril-hydrochlorothiazide (ZESTORETIC) 20-12.5 MG tablet Take 1 tablet by mouth once daily Patient taking differently: Take 1 tablet by mouth daily. 07/04/20   Vivi Barrack, MD  ondansetron (ZOFRAN) 8 MG tablet Take 1 tablet (8 mg total) by mouth 2 (two) times daily as needed for refractory nausea / vomiting. Start on day 3 after chemo. 08/10/20   Truitt Merle, MD  pantoprazole (PROTONIX) 40 MG tablet Take 1 tablet (40 mg total) by mouth daily. 08/27/20   Raiford Noble Latif, DO  prochlorperazine (COMPAZINE) 10 MG tablet Take 1 tablet (10 mg total) by mouth every 6 (six) hours as needed (Nausea or vomiting). 08/10/20   Truitt Merle, MD    Allergies    Sulfa antibiotics  Review of Systems   Review of Systems 10 systems reviewed and negative except as per HPI Physical Exam Updated Vital Signs BP (!) 132/106   Pulse (!) 106   Temp 98.2 F (36.8 C)   Resp 15   LMP 11/12/2011 Comment: pt denies preg  SpO2 99%   Physical Exam Constitutional:      Appearance: Normal appearance.  HENT:     Head: Normocephalic and atraumatic.     Nose: Nose normal.     Mouth/Throat:     Mouth: Mucous membranes are moist.      Pharynx: Oropharynx is clear.  Eyes:     Extraocular Movements: Extraocular movements intact.     Pupils: Pupils are equal, round, and reactive to light.  Cardiovascular:     Rate and Rhythm: Normal rate and regular rhythm.  Pulmonary:  Effort: Pulmonary effort is normal.     Breath sounds: Normal breath sounds.     Comments: Breasts are soft and pliable.  No lesions of the anterior chest wall.  Patient has a well-healed incision just at the left axilla that was a lymph node biopsy source. Abdominal:     General: There is no distension.     Palpations: Abdomen is soft.     Tenderness: There is no abdominal tenderness. There is no guarding.  Musculoskeletal:        General: No swelling, tenderness or deformity. Normal range of motion.     Right lower leg: No edema.     Left lower leg: No edema.  Skin:    General: Skin is warm and dry.  Neurological:     General: No focal deficit present.     Mental Status: She is alert and oriented to person, place, and time.     Motor: No weakness.     Coordination: Coordination normal.    ED Results / Procedures / Treatments   Labs (all labs ordered are listed, but only abnormal results are displayed) Labs Reviewed  COMPREHENSIVE METABOLIC PANEL - Abnormal; Notable for the following components:      Result Value   CO2 21 (*)    Calcium 8.2 (*)    Total Protein 5.7 (*)    Albumin 3.0 (*)    All other components within normal limits  CBC WITH DIFFERENTIAL/PLATELET - Abnormal; Notable for the following components:   Hemoglobin 11.3 (*)    Monocytes Absolute 0.0 (*)    All other components within normal limits  TSH - Abnormal; Notable for the following components:   TSH 0.349 (*)    All other components within normal limits  RESP PANEL BY RT-PCR (FLU A&B, COVID) ARPGX2  CULTURE, BLOOD (ROUTINE X 2)  CULTURE, BLOOD (ROUTINE X 2)  LIPASE, BLOOD  LACTIC ACID, PLASMA  MAGNESIUM  PHOSPHORUS  LACTIC ACID, PLASMA  URINALYSIS,  ROUTINE W REFLEX MICROSCOPIC  PROTIME-INR  D-DIMER, QUANTITATIVE  TROPONIN I (HIGH SENSITIVITY)  TROPONIN I (HIGH SENSITIVITY)    EKG EKG Interpretation  Date/Time:  Monday September 12 2020 11:37:12 EDT Ventricular Rate:  74 PR Interval:  154 QRS Duration: 82 QT Interval:  383 QTC Calculation: 425 R Axis:   58 Text Interpretation: Sinus rhythm normal, no change Confirmed by Charlesetta Shanks [phone]) on 09/12/2020 2:11:33 PM  Radiology No results found.  Procedures Procedures   Medications Ordered in ED Medications  sodium chloride 0.9 % bolus 1,000 mL (0 mLs Intravenous Stopped 09/12/20 1304)  ondansetron (ZOFRAN) injection 4 mg (4 mg Intravenous Given 09/12/20 1232)    ED Course  I have reviewed the triage vital signs and the nursing notes.  Pertinent labs & imaging results that were available during my care of the patient were reviewed by me and considered in my medical decision making (see chart for details).    MDM Rules/Calculators/A&P                          Patient presents as outlined.  She arrived with normal blood pressure.  She does have report of hypotension by EMS.  Patient describes a near syncopal episode.  While in the emergency department, supine without position change, she did develop a second episode of hypotension with generalized symptoms of weakness and vague nausea.  There was no focal pain symptom that could be identified.  Patient's blood pressures  dropped into the 80s and then rebounded spontaneously.  We will proceed with broad diagnostic evaluation.  Patient is not clearly septic at this time.  She is afebrile without review of systems to suggest any focus of infection.  Patient was rehydrated and felt much better.  She was up and ambulatory with stable blood pressure.  She felt asymptomatic.  At that time, patient decided she wished to leave and schedule the rest of her treatment on outpatient basis with her oncologist.  I did plan to obtain D-dimer and  rule out PE as a potential occult source of symptoms.  Patient does not have any shortness of breath, hypoxia or chest pain.  I did however review with her the risks of silent PE and the fact that she does have increased risk factors.  We reviewed the severity and potential mortality associated with pulmonary embolus.  Patient decided at this time she does not wish to proceed with D-dimer and complete rule out for PE.  She advises that she wants to make her follow-up appointments and will return if she has any kind of symptoms.  Patient given strong return precautions. Final Clinical Impression(s) / ED Diagnoses Final diagnoses:  Syncope and collapse  Hypotension, unspecified hypotension type  Malignant neoplasm of female breast, unspecified estrogen receptor status, unspecified laterality, unspecified site of breast [facility])    Rx / [place] Orders ED Discharge Orders     None        Charlesetta Shanks, MD 09/12/20 1609

## 2020-09-15 ENCOUNTER — Telehealth: Payer: Self-pay

## 2020-09-15 NOTE — Telephone Encounter (Signed)
Ms Wiens called stating she was in the ED on Monday for dehydration and hypotension.  She cancelled her appt on 6/20 here secondary to ED visit.  She has some concerns regarding this.  Appt made with Dr Burr Medico for tomorrow morning.

## 2020-09-16 ENCOUNTER — Other Ambulatory Visit: Payer: Self-pay

## 2020-09-16 ENCOUNTER — Inpatient Hospital Stay (HOSPITAL_BASED_OUTPATIENT_CLINIC_OR_DEPARTMENT_OTHER): Payer: BC Managed Care – PPO | Admitting: Hematology

## 2020-09-16 ENCOUNTER — Encounter: Payer: Self-pay | Admitting: Hematology

## 2020-09-16 VITALS — BP 117/70 | HR 99 | Temp 97.9°F | Resp 18 | Ht 60.0 in | Wt 165.9 lb

## 2020-09-16 DIAGNOSIS — Z170 Estrogen receptor positive status [ER+]: Secondary | ICD-10-CM

## 2020-09-16 DIAGNOSIS — C50412 Malignant neoplasm of upper-outer quadrant of left female breast: Secondary | ICD-10-CM | POA: Diagnosis not present

## 2020-09-16 MED ORDER — LISINOPRIL 20 MG PO TABS: 20.0000 mg | ORAL_TABLET | Freq: Every day | ORAL | 0 refills | Status: DC

## 2020-09-16 MED ORDER — SUCRALFATE 1 G PO TABS: 1.0000 g | ORAL_TABLET | Freq: Three times a day (TID) | ORAL | 2 refills | Status: DC

## 2020-09-16 NOTE — Progress Notes (Signed)
[facility]   [phone] [phone]   Clinic Follow up Note   Patient Care Team: Vivi Barrack, MD as PCP - General (Family Medicine) Mauro Kaufmann, RN as Oncology Nurse Navigator Rockwell Germany, RN as Oncology Nurse Navigator [name], MD as Consulting Physician (General Surgery) Kyung Rudd, MD as Consulting Physician (Radiation Oncology) Truitt Merle, MD as Consulting Physician (Hematology)  Date of Service:  09/16/2020  CHIEF COMPLAINT: f/u of left breast cancer  SUMMARY OF ONCOLOGIC HISTORY: Oncology History Overview Note  Cancer Staging Malignant neoplasm of upper-outer quadrant of left breast, estrogen receptor positive ([place]) Staging form: Breast, AJCC 8th Edition - Clinical stage from 05/31/2020: Stage IIA (cT2, cN0, cM0, G3, ER+, PR+, HER2-) - Signed by Truitt Merle, MD on 06/01/2020 Stage prefix: Initial diagnosis    Malignant neoplasm of upper-outer quadrant of left breast, estrogen receptor positive ([place])  05/17/2020 Mammogram   IMPRESSION: 1. Highly suspicious 2.3 cm mass involving the UPPER OUTER QUADRANT of the LEFT breast at the 1 o'clock position approximately 8 cm from the nipple. 2. No pathologic LEFT axillary lymphadenopathy. 3. No mammographic evidence of malignancy involving the RIGHT breast.   05/17/2020 Initial Biopsy   Diagnosis Breast, left, needle core biopsy, 1 o'clock - INVASIVE MAMMARY CARCINOMA.  Microscopic Comment The carcinoma appears grade 3. The greatest linear extent of tumor in any one core is 10 mm. E-cadherin will be reported separately. Ancillary studies will be reported separately. Results reported to [facility] on 05/18/2020. Dr. Tresa Moore reviewed the case. ADDENDUM: Immunohistochemistry for E-cadherin is positive supporting a ductal phenotype.    05/17/2020 Receptors her2   PROGNOSTIC INDICATORS Results: IMMUNOHISTOCHEMICAL AND MORPHOMETRIC ANALYSIS PERFORMED  MANUALLY The tumor cells are NEGATIVE for Her2 (1+). Estrogen Receptor: 90%, POSITIVE, STRONG STAINING INTENSITY Progesterone Receptor: 60%, POSITIVE, STRONG STAINING INTENSITY Proliferation Marker Ki67: 70%   05/31/2020 Initial Diagnosis   Malignant neoplasm of upper-outer quadrant of left breast, estrogen receptor positive ([place])    05/31/2020 Cancer Staging   Staging form: Breast, AJCC 8th Edition - Clinical stage from 05/31/2020: Stage IIA (cT2, cN0, cM0, G3, ER+, PR+, HER2-) - Signed by Truitt Merle, MD on 06/01/2020  Stage prefix: Initial diagnosis    06/03/2020 Cancer Staging   Staging form: Breast, AJCC 8th Edition - Pathologic stage from 06/03/2020: Stage IIA (pT2, pN1a, cM0, G3, ER+, PR+, HER2-) - Signed by Truitt Merle, MD on 06/29/2020  Stage prefix: Initial diagnosis  Multigene prognostic tests performed: MammaPrint  Histologic grading system: 3 grade system    06/03/2020 Surgery   Left breast lumpectomy with left axillary sentinel lymph node  biopsies  A. 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  - High grade ductal carcinoma in situ.  - Invasive carcinoma is at the inferior margin focally and <0.1 cm from  the anterior margin focally.  - In situ carcinoma is <0.1 cm from the anterior margin focally.  - Biopsy site.  - See oncology table.   B. 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   C. LYMPH NODE, LEFT AXILLARY, #2, SENTINEL, EXCISION:  - One of one lymph nodes negative for carcinoma (0/1).   07/20/2020 Imaging   CT CAP  IMPRESSION: 1. Surgical clips in the area of the LEFT breast laterally along the margin of water density well-circumscribed area that measures approximately 4.2 x 3.7 cm. This is likely  a postoperative seroma. Correlate with any pain or developing symptoms that would suggest infection. No gas or secondary findings to suggest this at  this time. 2. No evidence of metastatic disease in the chest, abdomen or pelvis. 3. Hepatic steatosis without focal lesion. 4. Calcified coronary artery disease. 5. Aortic atherosclerosis. 6. Mildly nodular thyroid. 7. Lesion in the RIGHT thyroid measuring up to 1.8 cm. Recommend thyroid US (ref: J Am Coll Radiol. 2015 Feb;12(2): 143-50).     07/20/2020 Imaging   Bone Scan  IMPRESSION: Signs of spondylosis of the thoracic and lumbar spine. No scintigraphic evidence of metastatic disease.     07/27/2020 Surgery   RE-EXCISION OF LEFT BREAST LUMPECTOMY and PAC placement by Dr Barry Dienes  FINAL MICROSCOPIC DIAGNOSIS:   A. BREAST, LEFT ANTERIOR MARGIN, EXCISION:  - Prior procedure site changes.  No carcinoma identified.   B. BREAST, LEFT INFERIOR MARGIN, EXCISION:  - Prior procedure site changes.  No carcinoma identified.    07/27/2020 Pathology Results   FINAL MICROSCOPIC DIAGNOSIS:   A. BREAST, LEFT ANTERIOR MARGIN, EXCISION:  - Prior procedure site changes.  No carcinoma identified.   B. BREAST, LEFT INFERIOR MARGIN, EXCISION:  - Prior procedure site changes.  No carcinoma identified.     08/18/2020 -  Chemotherapy   docetaxel and Cytoxan (TC) Q3weeks for 4 cycles.        CURRENT THERAPY:  docetaxel and Cytoxan (TC) Q3weeks for 4 cycles starting 08/18/20-10/20/20, dose reduced with C2  INTERVAL HISTORY:  Sherry Carrillo is here for a follow up of breast cancer. She was last seen by me 09/08/20. She presents to the clinic with her daughter. She notes earlier this week she felt mildly weak when she went to work and ended up passing out at work, before vomiting. She is concerned her HTN medication contributed to her dehydration. She was told she was dehydrated and felt better after IV Fluids. She notes every times she eats or drinks her stomach hurts. She notes this is now becoming constant. She has tried PPI which has not helped. She denies nausea. She notes Tylenol #3 has helped her  pain some. She feels like she is eating enough overall.   REVIEW OF SYSTEMS:   Constitutional: Denies fevers, chills or abnormal weight loss Eyes: Denies blurriness of vision Ears, nose, mouth, throat, and face: Denies mucositis or sore throat Respiratory: Denies cough, dyspnea or wheezes Cardiovascular: Denies palpitation, chest discomfort or lower extremity swelling Gastrointestinal:  Denies nausea, heartburn or change in bowel habits (+) Abdominal pain  Skin: Denies abnormal skin rashes Lymphatics: Denies new lymphadenopathy or easy bruising Neurological:Denies numbness, tingling or new weaknesses Behavioral/Psych: Mood is stable, no new changes  All other systems were reviewed with the patient and are negative.  MEDICAL HISTORY:  Past Medical History:  Diagnosis Date   Abnormal pap 2009   PT HAD SURGERY BUT DON'T KNOW WHAT TYPE   Breast cancer ([facility]) 04/27/2020   Headache(784.0)    otc meds prn   Hypertension    Hyperthyroidism    SVD (spontaneous vaginal delivery)    x 2    SURGICAL HISTORY: Past Surgical History:  Procedure Laterality Date   BREAST LUMPECTOMY WITH RADIOACTIVE SEED AND SENTINEL LYMPH NODE BIOPSY Left 06/03/2020   Procedure: RADIOACTIVE SEED GUIDED LEFT BREAST LUMPECTOMY, LEFT AXILLARY SENTINEL LYMPH NODE BIOPSY;  Surgeon: [name], MD;  Location: [facility];  Service: General;  Laterality: Left;   FOOT SURGERY     left   PORTACATH  PLACEMENT Left 07/27/2020   Procedure: INSERTION PORT-A-CATH;  Surgeon: [name], MD;  Location: [facility];  Service: General;  Laterality: Left;   RE-EXCISION OF BREAST LUMPECTOMY Left 07/27/2020   Procedure: RE-EXCISION OF LEFT BREAST LUMPECTOMY;  Surgeon: [name], MD;  Location: [facility];  Service: General;  Laterality: Left;   TUBAL LIGATION     WISDOM TOOTH EXTRACTION      I have reviewed the social history and family history with the patient and they are unchanged from previous note.  ALLERGIES:  is allergic to sulfa  antibiotics.  MEDICATIONS:  Current Outpatient Medications  Medication Sig Dispense Refill   lisinopril (ZESTRIL) 20 MG tablet Take 1 tablet (20 mg total) by mouth daily. 30 tablet 0   sucralfate (CARAFATE) 1 g tablet Take 1 tablet (1 g total) by mouth 4 (four) times daily -  with meals and at bedtime. 60 tablet 2   acetaminophen (TYLENOL) 325 MG tablet Take 2 tablets (650 mg total) by mouth every 6 (six) hours as needed for mild pain (or Fever >/= 101). 20 tablet 0   acetaminophen-codeine (TYLENOL #3) 300-30 MG tablet Take 1 tablet by mouth every 8 (eight) hours as needed for moderate pain. 30 tablet 0   calcium carbonate (TUMS - DOSED IN MG ELEMENTAL CALCIUM) 500 MG chewable tablet Chew 1 tablet (200 mg of elemental calcium total) by mouth 2 (two) times daily. 30 tablet 0   dexamethasone (DECADRON) 4 MG tablet Take 1 tablet (4 mg total) by mouth 2 (two) times daily. Start the day before Taxotere. Then again 1 tab daily the day after chemo for 3 days. 30 tablet 1   lidocaine-prilocaine (EMLA) cream Apply to affected area as needed (Patient taking differently: Apply 1 application topically as needed (port access).) 30 g 3   ondansetron (ZOFRAN) 8 MG tablet Take 1 tablet (8 mg total) by mouth 2 (two) times daily as needed for refractory nausea / vomiting. Start on day 3 after chemo. 30 tablet 1   pantoprazole (PROTONIX) 40 MG tablet Take 1 tablet (40 mg total) by mouth daily. 30 tablet 0   prochlorperazine (COMPAZINE) 10 MG tablet Take 1 tablet (10 mg total) by mouth every 6 (six) hours as needed (Nausea or vomiting). 30 tablet 1   No current facility-administered medications for this visit.   Facility-Administered Medications Ordered in Other Visits  Medication Dose Route Frequency Provider Last Rate Last Admin   sodium chloride flush (NS) 0.9 % injection 10 mL  10 mL Intracatheter PRN Truitt Merle, MD   10 mL at 09/08/20 1437    PHYSICAL EXAMINATION: ECOG PERFORMANCE STATUS: 1 - Symptomatic but  completely ambulatory  Vitals:   09/16/20 0924  BP: 117/70  Pulse: 99  Resp: 18  Temp: 97.9 F (36.6 C)  SpO2: 99%   Filed Weights   09/16/20 0924  Weight: 165 lb 14.4 oz (75.3 kg)    Due to COVID19 we will limit examination to appearance. Patient had no complaints.  GENERAL:alert, no distress and comfortable SKIN: skin color normal, no rashes or significant lesions EYES: normal, Conjunctiva are pink and non-injected, sclera clear  NEURO: alert & oriented x 3 with fluent speech   LABORATORY DATA:  I have reviewed the data as listed CBC Latest Ref Rng & Units 09/12/2020 09/08/2020 08/27/2020  WBC 4.0 - 10.5 K/uL 8.4 11.3(H) 48.0(H)  Hemoglobin 12.0 - 15.0 g/dL 11.3(L) 11.3(L) 11.1(L)  Hematocrit 36.0 - 46.0 % 36.3 34.8(L) 34.2(L)  Platelets 150 -  400 K/uL 254 478(H) 199     CMP Latest Ref Rng & Units 09/12/2020 09/08/2020 08/27/2020  Glucose 70 - 99 mg/dL 78 177(H) 121(H)  BUN 6 - 20 mg/dL _0 Creatinine 0.44 - 1.00 mg/dL 0.60 0.73 0.65  Sodium 135 - 145 mmol/L 137 139 138  Potassium 3.5 - 5.1 mmol/L 4.0 3.9 3.7  Chloride 98 - 111 mmol/L 108 108 109  CO2 22 - 32 mmol/L 21(L) 23 23  Calcium 8.9 - 10.3 mg/dL 8.2(L) 9.2 8.3(L)  Total Protein 6.5 - 8.1 g/dL 5.7(L) 6.8 5.9(L)  Total Bilirubin 0.3 - 1.2 mg/dL 0.8 0.2(L) 0.3  Alkaline Phos 38 - 126 U/L 68 80 117  AST 15 - 41 U/L 15 14(L) 35  ALT 0 - 44 U/L 17 25 51(H)      RADIOGRAPHIC STUDIES: I have personally reviewed the radiological images as listed and agreed with the findings in the report. No results found.   ASSESSMENT & PLAN:  JERZIE BIERI is a 55 y.o. female with    1. Malignant neoplasm of upper-outer quadrant of left breast, Stage IIA, pT2N1M0, stage IIA, ER+/PR+/HER2-, Grade III, Mammaprint high risk -She underwent Left breast lumpectomy with left axillary sentinel lymph node biopsies on 06/03/20 under Dr. Barry Dienes. Pathology showed: 2.4 cm invasive ductal carcinoma, grade 3; DCIS; one positive lymph node  (1/2); positive inferior margin. Her Mammaprint showed high risk luminal type.  -She underwent re-excision surgery on 07/27/20 with Dr Barry Dienes. Path showed no residual cancer. -Her CT CAP and bone scan from 07/27/20 were negative for distant mets. -To reduce her risk of cancer recurrence, I started her on adjuvant chemo with docetaxel and Cytoxan (TC) Q3weeks for 4 cycles starting 08/18/20. She did not tolerate cycle 1 and 2 well-- she experienced severe stomach pain on day 5 requiring hospitalization. -I added IV Ativan from C2 due to severe headache from [place]. This has helped.  -She has improved after recent syncope S/p C2. I reviewed management for her stomach pain and dehydration. Will add sucralfate, change HTN med to lisinopril and add more IV Fluids on week 1 treatment. She is agreeable -F/u on 7/7 with C3.     2. Symptom management: Stomach pain, Dehydration -secondary to TC. Exacerbated by eating and drinking.  -Protonix alone has not helped. I will call in sucralfate (09/16/20). I also recommend more bland diet with smaller frequent meals.  -She can continue Tylenol #3.  -Given she is dehydrated will given IV Fluids twice the week of chemo infusion. She is agreeable.  -I will also change her HTN medication to Lisinopril without HCTZ.    3. Hot flashes -She was seen to be post-menopausal on 12/2017 labs with [facility] -She started having hot flashes in Fall 2021. Her gyn started on her Estradial patch in 04/2020 which helps very well. Due to ER/PR positive disease, she stopped in March.  -Has improved on Effexor.    4. Social Support, Anxiety  -She is married with 2 adult daughters. Her family is aware of her breast cancer diagnosis. -She is working on coping better with diagnosis and chemo.    5. She declined Genetic Testing (08/10/20)     PLAN:  -I called in Sucralfate and lisinopril today, she will stop Lisinopril-HCTZ -IV fluids tomorrow, 1L over 2 hr  -IV Fluids on  7/11 and 8/1 -Lab, flush, F/u and TC on 7/7, 7/28    No problem-specific Assessment & Plan notes found for  this encounter.   No orders of the defined types were placed in this encounter.  All questions were answered. The patient knows to call the clinic with any problems, questions or concerns. No barriers to learning was detected. The total time spent in the appointment was 25 minutes.     Truitt Merle, MD 09/16/2020   I, Joslyn Devon, am acting as scribe for Truitt Merle, MD.   I have reviewed the above documentation for accuracy and completeness, and I agree with the above.

## 2020-09-17 ENCOUNTER — Inpatient Hospital Stay: Payer: BC Managed Care – PPO

## 2020-09-17 VITALS — BP 123/75 | HR 84 | Temp 98.2°F | Resp 16

## 2020-09-17 DIAGNOSIS — C50412 Malignant neoplasm of upper-outer quadrant of left female breast: Secondary | ICD-10-CM

## 2020-09-17 DIAGNOSIS — Z95828 Presence of other vascular implants and grafts: Secondary | ICD-10-CM

## 2020-09-17 DIAGNOSIS — Z170 Estrogen receptor positive status [ER+]: Secondary | ICD-10-CM

## 2020-09-17 LAB — CULTURE, BLOOD (ROUTINE X 2): Culture: NO GROWTH

## 2020-09-17 MED ORDER — HEPARIN SOD (PORK) LOCK FLUSH 100 UNIT/ML IV SOLN
500.0000 [IU] | Freq: Once | INTRAVENOUS | Status: AC | PRN
  Administered 2020-09-17: 500 [IU]
  Filled 2020-09-17: qty 5

## 2020-09-17 MED ORDER — SODIUM CHLORIDE 0.9% FLUSH
10.0000 mL | Freq: Once | INTRAVENOUS | Status: AC | PRN
  Administered 2020-09-17: 10 mL
  Filled 2020-09-17: qty 10

## 2020-09-17 MED ORDER — SODIUM CHLORIDE 0.9 % IV SOLN
Freq: Once | INTRAVENOUS | Status: AC
Start: 2020-09-17 — End: 2020-09-17
  Filled 2020-09-17: qty 250

## 2020-09-17 NOTE — Patient Instructions (Signed)
[facility] ONCOLOGY  Discharge Instructions: Thank you for choosing [facility] to provide your oncology and hematology care.   If you have a lab appointment with the [facility], please go directly to the [facility] and check in at the registration area.   Wear comfortable clothing and clothing appropriate for easy access to any Portacath or PICC line.   We strive to give you quality time with your provider. You may need to reschedule your appointment if you arrive late (15 or more minutes).  Arriving late affects you and other patients whose appointments are after yours.  Also, if you miss three or more appointments without notifying the office, you may be dismissed from the clinic at the provider's discretion.      For prescription refill requests, have your pharmacy contact our office and allow 72 hours for refills to be completed.    Today you received the following: IV fluids   To help prevent nausea and vomiting after your treatment, we encourage you to take your nausea medication as directed.  BELOW ARE SYMPTOMS THAT SHOULD BE REPORTED IMMEDIATELY: *FEVER GREATER THAN 100.4 F (38 C) OR HIGHER *CHILLS OR SWEATING *NAUSEA AND VOMITING THAT IS NOT CONTROLLED WITH YOUR NAUSEA MEDICATION *UNUSUAL SHORTNESS OF BREATH *UNUSUAL BRUISING OR BLEEDING *URINARY PROBLEMS (pain or burning when urinating, or frequent urination) *BOWEL PROBLEMS (unusual diarrhea, constipation, pain near the anus) TENDERNESS IN MOUTH AND THROAT WITH OR WITHOUT PRESENCE OF ULCERS (sore throat, sores in mouth, or a toothache) UNUSUAL RASH, SWELLING OR PAIN  UNUSUAL VAGINAL DISCHARGE OR ITCHING   Items with * indicate a potential emergency and should be followed up as soon as possible or go to the Emergency Department if any problems should occur.  Please show the CHEMOTHERAPY ALERT CARD or IMMUNOTHERAPY ALERT CARD at check-in to the Emergency Department and triage  nurse.  Should you have questions after your visit or need to cancel or reschedule your appointment, please contact [facility]  Dept: [phone]  and follow the prompts.  Office hours are 8:00 a.m. to 4:30 p.m. Monday - Friday. Please note that voicemails left after 4:00 p.m. may not be returned until the following business day.  We are closed weekends and major holidays. You have access to a nurse at all times for urgent questions. Please call the main number to the clinic Dept: [phone] and follow the prompts.   For any non-urgent questions, you may also contact your provider using MyChart. We now offer e-Visits for anyone 20 and older to request care online for non-urgent symptoms. For details visit [email or web address].   Also download the MyChart app! Go to the app store, search "MyChart", open the app, select [facility], and log in with your MyChart username and password.  Due to Covid, a mask is required upon entering the hospital/clinic. If you do not have a mask, one will be given to you upon arrival. For doctor visits, patients may have 1 support person aged 81 or older with them. For treatment visits, patients cannot have anyone with them due to current Covid guidelines and our immunocompromised population.

## 2020-09-23 ENCOUNTER — Telehealth: Payer: Self-pay | Admitting: Hematology

## 2020-09-23 NOTE — Telephone Encounter (Signed)
Sch per 04/29 staff msg, left message.

## 2020-09-28 NOTE — Progress Notes (Signed)
[place]   Telephone:([phone]) [phone] Fax:([phone]) [phone]   Clinic Follow up Note   Patient Care Team: Vivi Barrack, MD as PCP - General (Family Medicine) Mauro Kaufmann, RN as Oncology Nurse Navigator Rockwell Germany, RN as Oncology Nurse Navigator [name], MD as Consulting Physician (General Surgery) Kyung Rudd, MD as Consulting Physician (Radiation Oncology) Truitt Merle, MD as Consulting Physician (Hematology)  Date of Service:  09/29/2020  CHIEF COMPLAINT: f/u of left breast cancer  SUMMARY OF ONCOLOGIC HISTORY: Oncology History Overview Note  Cancer Staging Malignant neoplasm of upper-outer quadrant of left breast, estrogen receptor positive ([place]) Staging form: Breast, AJCC 8th Edition - Clinical stage from 05/31/2020: Stage IIA (cT2, cN0, cM0, G3, ER+, PR+, HER2-) - Signed by Truitt Merle, MD on 06/01/2020 Stage prefix: Initial diagnosis    Malignant neoplasm of upper-outer quadrant of left breast, estrogen receptor positive ([place])  05/17/2020 Mammogram   IMPRESSION: 1. Highly suspicious 2.3 cm mass involving the UPPER OUTER QUADRANT of the LEFT breast at the 1 o'clock position approximately 8 cm from the nipple. 2. No pathologic LEFT axillary lymphadenopathy. 3. No mammographic evidence of malignancy involving the RIGHT breast.   05/17/2020 Initial Biopsy   Diagnosis Breast, left, needle core biopsy, 1 o'clock - INVASIVE MAMMARY CARCINOMA.  Microscopic Comment The carcinoma appears grade 3. The greatest linear extent of tumor in any one core is 10 mm. E-cadherin will be reported separately. Ancillary studies will be reported separately. Results reported to [facility] on 05/18/2020. Dr. Tresa Moore reviewed the case. ADDENDUM: Immunohistochemistry for E-cadherin is positive supporting a ductal phenotype.    05/17/2020 Receptors her2   PROGNOSTIC INDICATORS Results: IMMUNOHISTOCHEMICAL AND MORPHOMETRIC ANALYSIS PERFORMED MANUALLY The  tumor cells are NEGATIVE for Her2 (1+). Estrogen Receptor: 90%, POSITIVE, STRONG STAINING INTENSITY Progesterone Receptor: 60%, POSITIVE, STRONG STAINING INTENSITY Proliferation Marker Ki67: 70%   05/31/2020 Initial Diagnosis   Malignant neoplasm of upper-outer quadrant of left breast, estrogen receptor positive ([place])    05/31/2020 Cancer Staging   Staging form: Breast, AJCC 8th Edition - Clinical stage from 05/31/2020: Stage IIA (cT2, cN0, cM0, G3, ER+, PR+, HER2-) - Signed by Truitt Merle, MD on 06/01/2020  Stage prefix: Initial diagnosis    06/03/2020 Cancer Staging   Staging form: Breast, AJCC 8th Edition - Pathologic stage from 06/03/2020: Stage IIA (pT2, pN1a, cM0, G3, ER+, PR+, HER2-) - Signed by Truitt Merle, MD on 06/29/2020  Stage prefix: Initial diagnosis  Multigene prognostic tests performed: MammaPrint  Histologic grading system: 3 grade system    06/03/2020 Surgery   Left breast lumpectomy with left axillary sentinel lymph node  biopsies  A. 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  - High grade ductal carcinoma in situ.  - Invasive carcinoma is at the inferior margin focally and <0.1 cm from  the anterior margin focally.  - In situ carcinoma is <0.1 cm from the anterior margin focally.  - Biopsy site.  - See oncology table.   B. 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   C. LYMPH NODE, LEFT AXILLARY, #2, SENTINEL, EXCISION:  - One of one lymph nodes negative for carcinoma (0/1).   07/20/2020 Imaging   CT CAP  IMPRESSION: 1. Surgical clips in the area of the LEFT breast laterally along the margin of water density well-circumscribed area that measures approximately 4.2 x 3.7 cm. This is likely  a postoperative seroma. Correlate with any pain or developing symptoms that would suggest infection. No gas or secondary findings to suggest this at this time. 2. No  evidence of metastatic disease in the chest, abdomen or pelvis. 3. Hepatic steatosis without focal lesion. 4. Calcified coronary artery disease. 5. Aortic atherosclerosis. 6. Mildly nodular thyroid. 7. Lesion in the RIGHT thyroid measuring up to 1.8 cm. Recommend thyroid US (ref: J Am Coll Radiol. 2015 Feb;12(2): 143-50).     07/20/2020 Imaging   Bone Scan  IMPRESSION: Signs of spondylosis of the thoracic and lumbar spine. No scintigraphic evidence of metastatic disease.     07/27/2020 Surgery   RE-EXCISION OF LEFT BREAST LUMPECTOMY and PAC placement by Dr Barry Dienes  FINAL MICROSCOPIC DIAGNOSIS:   A. BREAST, LEFT ANTERIOR MARGIN, EXCISION:  - Prior procedure site changes.  No carcinoma identified.   B. BREAST, LEFT INFERIOR MARGIN, EXCISION:  - Prior procedure site changes.  No carcinoma identified.    07/27/2020 Pathology Results   FINAL MICROSCOPIC DIAGNOSIS:   A. BREAST, LEFT ANTERIOR MARGIN, EXCISION:  - Prior procedure site changes.  No carcinoma identified.   B. BREAST, LEFT INFERIOR MARGIN, EXCISION:  - Prior procedure site changes.  No carcinoma identified.     08/18/2020 -  Chemotherapy   docetaxel and Cytoxan (TC) Q3weeks for 4 cycles.        CURRENT THERAPY:  docetaxel and Cytoxan (TC) Q3weeks for 4 cycles starting 08/18/20-10/20/20, dose reduced with C2  INTERVAL HISTORY:  Sherry Carrillo is here for a follow up of breast cancer. She was last seen by me on 09/16/2020. She presents to the clinic accompanied by her husband. She reports her Dignicap has not arrived yet. She expressed concern with not having this today and refused to continue without it. She reports her hot flashes have increased, especially at night. She notes she tried the Effexor, but it made her sick. She reports vomiting. She notes, however, that it did help with her hot flashes.  All other systems were reviewed with the patient and are negative.  MEDICAL HISTORY:  Past Medical History:   Diagnosis Date   Abnormal pap 2009   PT HAD SURGERY BUT DON'T KNOW WHAT TYPE   Breast cancer ([place]) 04/27/2020   Headache(784.0)    otc meds prn   Hypertension    Hyperthyroidism    SVD (spontaneous vaginal delivery)    x 2    SURGICAL HISTORY: Past Surgical History:  Procedure Laterality Date   BREAST LUMPECTOMY WITH RADIOACTIVE SEED AND SENTINEL LYMPH NODE BIOPSY Left 06/03/2020   Procedure: RADIOACTIVE SEED GUIDED LEFT BREAST LUMPECTOMY, LEFT AXILLARY SENTINEL LYMPH NODE BIOPSY;  Surgeon: [name], MD;  Location: [facility];  Service: General;  Laterality: Left;   FOOT SURGERY     left   PORTACATH PLACEMENT Left 07/27/2020   Procedure: INSERTION PORT-A-CATH;  Surgeon: [name], MD;  Location: [facility];  Service: General;  Laterality: Left;   RE-EXCISION OF BREAST LUMPECTOMY Left 07/27/2020   Procedure: RE-EXCISION OF LEFT BREAST LUMPECTOMY;  Surgeon: [name], MD;  Location: [facility];  Service: General;  Laterality: Left;   TUBAL LIGATION     WISDOM TOOTH EXTRACTION      I have reviewed the social history and family history with the patient and they are unchanged from previous note.  ALLERGIES:  is allergic to sulfa antibiotics.  MEDICATIONS:  Current Outpatient Medications  Medication Sig Dispense Refill   gabapentin (NEURONTIN) 100 MG capsule Take 1  capsule (100 mg total) by mouth 3 (three) times daily. 30 capsule 0   acetaminophen (TYLENOL) 325 MG tablet Take 2 tablets (650 mg total) by mouth every 6 (six) hours as needed for mild pain (or Fever >/= 101). 20 tablet 0   acetaminophen-codeine (TYLENOL #3) 300-30 MG tablet Take 1 tablet by mouth every 8 (eight) hours as needed for moderate pain. 30 tablet 0   calcium carbonate (TUMS - DOSED IN MG ELEMENTAL CALCIUM) 500 MG chewable tablet Chew 1 tablet (200 mg of elemental calcium total) by mouth 2 (two) times daily. 30 tablet 0   dexamethasone (DECADRON) 4 MG tablet Take 1 tablet (4 mg total) by mouth 2 (two) times daily.  Start the day before Taxotere. Then again 1 tab daily the day after chemo for 3 days. 30 tablet 1   lidocaine-prilocaine (EMLA) cream Apply to affected area as needed (Patient taking differently: Apply 1 application topically as needed (port access).) 30 g 3   lisinopril (ZESTRIL) 20 MG tablet Take 1 tablet (20 mg total) by mouth daily. 30 tablet 0   ondansetron (ZOFRAN) 8 MG tablet Take 1 tablet (8 mg total) by mouth 2 (two) times daily as needed for refractory nausea / vomiting. Start on day 3 after chemo. 30 tablet 1   pantoprazole (PROTONIX) 40 MG tablet Take 1 tablet (40 mg total) by mouth daily. 30 tablet 0   prochlorperazine (COMPAZINE) 10 MG tablet Take 1 tablet (10 mg total) by mouth every 6 (six) hours as needed (Nausea or vomiting). 30 tablet 1   sucralfate (CARAFATE) 1 g tablet Take 1 tablet (1 g total) by mouth 4 (four) times daily -  with meals and at bedtime. 60 tablet 2   No current facility-administered medications for this visit.   Facility-Administered Medications Ordered in Other Visits  Medication Dose Route Frequency Provider Last Rate Last Admin   sodium chloride flush (NS) 0.9 % injection 10 mL  10 mL Intracatheter PRN Truitt Merle, MD   10 mL at 09/08/20 1437    PHYSICAL EXAMINATION: ECOG PERFORMANCE STATUS: 1 - Symptomatic but completely ambulatory  Vitals:   09/29/20 0842  BP: 129/81  Pulse: 80  Resp: 18  Temp: 97.7 F (36.5 C)  SpO2: 100%   Filed Weights   09/29/20 0842  Weight: 169 lb 9.6 oz (76.9 kg)    Due to COVID19 we will limit examination to appearance. Patient had no complaints.  GENERAL:alert, no distress and comfortable SKIN: skin color normal, no rashes or significant lesions EYES: normal, Conjunctiva are pink and non-injected, sclera clear  NEURO: alert & oriented x 3 with fluent speech  LABORATORY DATA:  I have reviewed the data as listed CBC Latest Ref Rng & Units 09/29/2020 09/12/2020 09/08/2020  WBC 4.0 - 10.5 K/uL 7.3 8.4 11.3(H)   Hemoglobin 12.0 - 15.0 g/dL 11.6(L) 11.3(L) 11.3(L)  Hematocrit 36.0 - 46.0 % 35.3(L) 36.3 34.8(L)  Platelets 150 - 400 K/uL 335 254 478(H)     CMP Latest Ref Rng & Units 09/29/2020 09/12/2020 09/08/2020  Glucose 70 - 99 mg/dL 130(H) 78 177(H)  BUN 6 - 20 mg/dL _0 Creatinine 0.44 - 1.00 mg/dL 0.68 0.60 0.73  Sodium 135 - 145 mmol/L 141 137 139  Potassium 3.5 - 5.1 mmol/L 4.4 4.0 3.9  Chloride 98 - 111 mmol/L 108 108 108  CO2 22 - 32 mmol/L 24 21(L) 23  Calcium 8.9 - 10.3 mg/dL 9.3 8.2(L) 9.2  Total Protein 6.5 -  8.1 g/dL 6.7 5.7(L) 6.8  Total Bilirubin 0.3 - 1.2 mg/dL 0.3 0.8 0.2(L)  Alkaline Phos 38 - 126 U/L 102 68 80  AST 15 - 41 U/L 33 15 14(L)  ALT 0 - 44 U/L 58(H) 17 25      RADIOGRAPHIC STUDIES: I have personally reviewed the radiological images as listed and agreed with the findings in the report. No results found.   ASSESSMENT & PLAN:  Sherry Carrillo is a 55 y.o. female with   1. Malignant neoplasm of upper-outer quadrant of left breast, Stage IIA, pT2N1M0, stage IIA, ER+/PR+/HER2-, Grade III, Mammaprint high risk -She underwent Left breast lumpectomy with left axillary sentinel lymph node biopsies on 06/03/20 under Dr. Barry Dienes. Pathology showed: 2.4 cm invasive ductal carcinoma, grade 3; DCIS; one positive lymph node (1/2); positive inferior margin. Her Mammaprint showed high risk luminal type.  -She underwent re-excision surgery on 07/27/20 with Dr Barry Dienes. Path showed no residual cancer. -Her CT CAP and bone scan from 07/27/20 were negative for distant mets. -To reduce her risk of cancer recurrence, I started her on adjuvant chemo with docetaxel and Cytoxan (TC) Q3weeks for 4 cycles starting 08/18/20. She did not tolerate cycle 1 well-- she experienced severe stomach pain on day 5 requiring hospitalization. She tolerated cycle 2 better with dose reduction  -I added IV Ativan from C2 due to severe headache from [place]. This has helped. -Labs reviewed, adequate to proceed  with C3 today at same dose reduction -will add IVF on day 3, 5 and 6    2. Symptom management: Stomach pain, Dehydration -secondary to TC. Exacerbated by eating and drinking. -Protonix alone has not helped. I will call in sucralfate (09/16/20). I also recommend more bland diet with smaller frequent meals. -She can continue Tylenol #3. -I changed her HTN medicine to Lisinopril without HCTZ on 09/12/20. She has some minimal ankle swelling. -She continues to be dehydrated. We will give her IF fluids again on Saturday (with the pegfilgrastim), Monday, and Tuesday.   3. Hot flashes -She was seen to be post-menopausal on 12/2017 labs with [place] [facility] -She started having hot flashes in Fall 2021. Her gyn started on her Estradial patch in 04/2020 which helps very well. Due to ER/PR positive disease, she stopped in March.  -She experienced vomiting with Effexor. -Her hot flashes have worsened recently. I recommend starting gabapentin. I prescribed this for her today (09/29/20)   4. Social Support, Anxiety  -She is married with 2 adult daughters. Her family is aware of her breast cancer diagnosis. -She is working on coping better with diagnosis and chemo.    5. She declined Genetic Testing (08/10/20)     PLAN:  -proceed with C3 TC today at same dose reduction  -IVF and pegfilgrastim on 10/01/20  -IVF 7/11, 7/12 -Lab, flush, F/u and last cycle TC on 7/28    No problem-specific Assessment & Plan notes found for this encounter.   No orders of the defined types were placed in this encounter.  All questions were answered. The patient knows to call the clinic with any problems, questions or concerns. No barriers to learning was detected. The total time spent in the appointment was 30 minutes.     Truitt Merle, MD 09/29/2020   I, Wilburn Mylar, am acting as scribe for Truitt Merle, MD.   I have reviewed the above documentation for accuracy and completeness, and I agree with the above.

## 2020-09-29 ENCOUNTER — Encounter: Payer: Self-pay | Admitting: *Deleted

## 2020-09-29 ENCOUNTER — Other Ambulatory Visit: Payer: BC Managed Care – PPO

## 2020-09-29 ENCOUNTER — Inpatient Hospital Stay: Payer: BC Managed Care – PPO | Attending: Hematology

## 2020-09-29 ENCOUNTER — Ambulatory Visit: Payer: BC Managed Care – PPO

## 2020-09-29 ENCOUNTER — Ambulatory Visit: Payer: BC Managed Care – PPO | Admitting: Hematology

## 2020-09-29 ENCOUNTER — Other Ambulatory Visit: Payer: Self-pay

## 2020-09-29 ENCOUNTER — Encounter: Payer: Self-pay | Admitting: Hematology

## 2020-09-29 ENCOUNTER — Inpatient Hospital Stay: Payer: BC Managed Care – PPO

## 2020-09-29 ENCOUNTER — Inpatient Hospital Stay (HOSPITAL_BASED_OUTPATIENT_CLINIC_OR_DEPARTMENT_OTHER): Payer: BC Managed Care – PPO | Admitting: Hematology

## 2020-09-29 VITALS — BP 129/81 | HR 80 | Temp 97.7°F | Resp 18 | Ht 60.0 in | Wt 169.6 lb

## 2020-09-29 DIAGNOSIS — Z170 Estrogen receptor positive status [ER+]: Secondary | ICD-10-CM

## 2020-09-29 DIAGNOSIS — I10 Essential (primary) hypertension: Secondary | ICD-10-CM | POA: Diagnosis not present

## 2020-09-29 DIAGNOSIS — Z5189 Encounter for other specified aftercare: Secondary | ICD-10-CM | POA: Diagnosis not present

## 2020-09-29 DIAGNOSIS — Z5111 Encounter for antineoplastic chemotherapy: Secondary | ICD-10-CM | POA: Insufficient documentation

## 2020-09-29 DIAGNOSIS — E860 Dehydration: Secondary | ICD-10-CM | POA: Diagnosis not present

## 2020-09-29 DIAGNOSIS — C50412 Malignant neoplasm of upper-outer quadrant of left female breast: Secondary | ICD-10-CM

## 2020-09-29 DIAGNOSIS — E042 Nontoxic multinodular goiter: Secondary | ICD-10-CM | POA: Insufficient documentation

## 2020-09-29 DIAGNOSIS — R109 Unspecified abdominal pain: Secondary | ICD-10-CM | POA: Diagnosis not present

## 2020-09-29 DIAGNOSIS — I700 Atherosclerosis of aorta: Secondary | ICD-10-CM | POA: Diagnosis not present

## 2020-09-29 DIAGNOSIS — R232 Flushing: Secondary | ICD-10-CM | POA: Diagnosis not present

## 2020-09-29 DIAGNOSIS — Z79899 Other long term (current) drug therapy: Secondary | ICD-10-CM | POA: Insufficient documentation

## 2020-09-29 DIAGNOSIS — I2510 Atherosclerotic heart disease of native coronary artery without angina pectoris: Secondary | ICD-10-CM | POA: Diagnosis not present

## 2020-09-29 DIAGNOSIS — Z95828 Presence of other vascular implants and grafts: Secondary | ICD-10-CM

## 2020-09-29 LAB — CMP (CANCER CENTER ONLY)
ALT: 58 U/L — ABNORMAL HIGH (ref 0–44)
AST: 33 U/L (ref 15–41)
Albumin: 3.3 g/dL — ABNORMAL LOW (ref 3.5–5.0)
Alkaline Phosphatase: 102 U/L (ref 38–126)
Anion gap: 9 (ref 5–15)
BUN: 17 mg/dL (ref 6–20)
CO2: 24 mmol/L (ref 22–32)
Calcium: 9.3 mg/dL (ref 8.9–10.3)
Chloride: 108 mmol/L (ref 98–111)
Creatinine: 0.68 mg/dL (ref 0.44–1.00)
GFR, Estimated: >60 mL/min (ref >60)
Glucose, Bld: 130 mg/dL — ABNORMAL HIGH (ref 70–99)
Potassium: 4.4 mmol/L (ref 3.5–5.1)
Sodium: 141 mmol/L (ref 135–145)
Total Bilirubin: 0.3 mg/dL (ref 0.3–1.2)
Total Protein: 6.7 g/dL (ref 6.5–8.1)

## 2020-09-29 LAB — CBC WITH DIFFERENTIAL (CANCER CENTER ONLY)
Abs Immature Granulocytes: 0.02 10*3/uL (ref 0.00–0.07)
Basophils Absolute: 0 10*3/uL (ref 0.0–0.1)
Basophils Relative: 0 %
Eosinophils Absolute: 0 10*3/uL (ref 0.0–0.5)
Eosinophils Relative: 0 %
HCT: 35.3 % — ABNORMAL LOW (ref 36.0–46.0)
Hemoglobin: 11.6 g/dL — ABNORMAL LOW (ref 12.0–15.0)
Immature Granulocytes: 0 %
Lymphocytes Relative: 9 %
Lymphs Abs: 0.7 10*3/uL (ref 0.7–4.0)
MCH: 28.4 pg (ref 26.0–34.0)
MCHC: 32.9 g/dL (ref 30.0–36.0)
MCV: 86.3 fL (ref 80.0–100.0)
Monocytes Absolute: 0.2 10*3/uL (ref 0.1–1.0)
Monocytes Relative: 3 %
Neutro Abs: 6.4 10*3/uL (ref 1.7–7.7)
Neutrophils Relative %: 88 %
Platelet Count: 335 10*3/uL (ref 150–400)
RBC: 4.09 MIL/uL (ref 3.87–5.11)
RDW: 15 % (ref 11.5–15.5)
WBC Count: 7.3 10*3/uL (ref 4.0–10.5)
nRBC: 0 % (ref 0.0–0.2)

## 2020-09-29 MED ORDER — GABAPENTIN 100 MG PO CAPS: 100.0000 mg | ORAL_CAPSULE | Freq: Three times a day (TID) | ORAL | 0 refills | Status: DC

## 2020-09-29 MED ORDER — PALONOSETRON HCL INJECTION 0.25 MG/5ML
0.2500 mg | Freq: Once | INTRAVENOUS | Status: AC
  Administered 2020-09-29: 0.25 mg via INTRAVENOUS

## 2020-09-29 MED ORDER — LORAZEPAM 2 MG/ML IJ SOLN
2.0000 mg | Freq: Once | INTRAMUSCULAR | Status: AC
  Administered 2020-09-29: 2 mg via INTRAVENOUS

## 2020-09-29 MED ORDER — SODIUM CHLORIDE 0.9% FLUSH
10.0000 mL | Freq: Once | INTRAVENOUS | Status: AC
  Administered 2020-09-29: 10 mL
  Filled 2020-09-29: qty 10

## 2020-09-29 MED ORDER — PALONOSETRON HCL INJECTION 0.25 MG/5ML
INTRAVENOUS | Status: AC
  Filled 2020-09-29: qty 5

## 2020-09-29 MED ORDER — HEPARIN SOD (PORK) LOCK FLUSH 100 UNIT/ML IV SOLN
500.0000 [IU] | Freq: Once | INTRAVENOUS | Status: AC | PRN
  Administered 2020-09-29: 500 [IU]
  Filled 2020-09-29: qty 5

## 2020-09-29 MED ORDER — LORAZEPAM 2 MG/ML IJ SOLN
INTRAMUSCULAR | Status: AC
  Filled 2020-09-29: qty 1

## 2020-09-29 MED ORDER — DEXAMETHASONE SODIUM PHOSPHATE 100 MG/10ML IJ SOLN
10.0000 mg | Freq: Once | INTRAMUSCULAR | Status: AC
  Administered 2020-09-29: 10 mg via INTRAVENOUS
  Filled 2020-09-29: qty 10

## 2020-09-29 MED ORDER — SODIUM CHLORIDE 0.9% FLUSH
10.0000 mL | INTRAVENOUS | Status: DC | PRN
  Administered 2020-09-29: 10 mL
  Filled 2020-09-29: qty 10

## 2020-09-29 MED ORDER — SODIUM CHLORIDE 0.9 % IV SOLN
500.0000 mg/m2 | Freq: Once | INTRAVENOUS | Status: AC
  Administered 2020-09-29: 880 mg via INTRAVENOUS
  Filled 2020-09-29: qty 44

## 2020-09-29 MED ORDER — SODIUM CHLORIDE 0.9 % IV SOLN
Freq: Once | INTRAVENOUS | Status: AC
Start: 2020-09-29 — End: 2020-09-29
  Filled 2020-09-29: qty 250

## 2020-09-29 MED ORDER — SODIUM CHLORIDE 0.9 % IV SOLN
60.0000 mg/m2 | Freq: Once | INTRAVENOUS | Status: AC
  Administered 2020-09-29: 110 mg via INTRAVENOUS
  Filled 2020-09-29: qty 11

## 2020-09-29 NOTE — Patient Instructions (Signed)
[facility]  Discharge Instructions: Thank you for choosing [facility] to provide your oncology and hematology care.   If you have a lab appointment with the [facility], please go directly to the [facility] and check in at the registration area.   Wear comfortable clothing and clothing appropriate for easy access to any Portacath or PICC line.   We strive to give you quality time with your provider. You may need to reschedule your appointment if you arrive late (15 or more minutes).  Arriving late affects you and other patients whose appointments are after yours.  Also, if you miss three or more appointments without notifying the office, you may be dismissed from the clinic at the provider's discretion.      For prescription refill requests, have your pharmacy contact our office and allow 72 hours for refills to be completed.    Today you received the following chemotherapy and/or immunotherapy agents Taxotere & Cytoxan      To help prevent nausea and vomiting after your treatment, we encourage you to take your nausea medication as directed.  BELOW ARE SYMPTOMS THAT SHOULD BE REPORTED IMMEDIATELY: *FEVER GREATER THAN 100.4 F (38 C) OR HIGHER *CHILLS OR SWEATING *NAUSEA AND VOMITING THAT IS NOT CONTROLLED WITH YOUR NAUSEA MEDICATION *UNUSUAL SHORTNESS OF BREATH *UNUSUAL BRUISING OR BLEEDING *URINARY PROBLEMS (pain or burning when urinating, or frequent urination) *BOWEL PROBLEMS (unusual diarrhea, constipation, pain near the anus) TENDERNESS IN MOUTH AND THROAT WITH OR WITHOUT PRESENCE OF ULCERS (sore throat, sores in mouth, or a toothache) UNUSUAL RASH, SWELLING OR PAIN  UNUSUAL VAGINAL DISCHARGE OR ITCHING   Items with * indicate a potential emergency and should be followed up as soon as possible or go to the Emergency Department if any problems should occur.  Please show the CHEMOTHERAPY ALERT CARD or IMMUNOTHERAPY ALERT CARD at  check-in to the Emergency Department and triage nurse.  Should you have questions after your visit or need to cancel or reschedule your appointment, please contact [facility]  Dept: [phone]  and follow the prompts.  Office hours are 8:00 a.m. to 4:30 p.m. Monday - Friday. Please note that voicemails left after 4:00 p.m. may not be returned until the following business day.  We are closed weekends and major holidays. You have access to a nurse at all times for urgent questions. Please call the main number to the clinic Dept: [phone] and follow the prompts.   For any non-urgent questions, you may also contact your provider using MyChart. We now offer e-Visits for anyone 64 and older to request care online for non-urgent symptoms. For details visit [email or web address].   Also download the MyChart app! Go to the app store, search "MyChart", open the app, select [facility], and log in with your MyChart username and password.  Due to Covid, a mask is required upon entering the hospital/clinic. If you do not have a mask, one will be given to you upon arrival. For doctor visits, patients may have 1 support person aged 24 or older with them. For treatment visits, patients cannot have anyone with them due to current Covid guidelines and our immunocompromised population.   Docetaxel injection What is this medication? DOCETAXEL (doe se TAX el) is a chemotherapy drug. It targets fast dividing cells, like cancer cells, and causes these cells to die. This medicine is used to treat many types of cancers like breast cancer, certain stomach cancers,head and neck  cancer, lung cancer, and prostate cancer. This medicine may be used for other purposes; ask your health care provider orpharmacist if you have questions. COMMON BRAND NAME(S): Docefrez, Taxotere What should I tell my care team before I take this medication? They need to know if you have any of these  conditions: infection (especially a virus infection such as chickenpox, cold sores, or herpes) liver disease low blood counts, like low white cell, platelet, or red cell counts an unusual or allergic reaction to docetaxel, polysorbate 80, other chemotherapy agents, other medicines, foods, dyes, or preservatives pregnant or trying to get pregnant breast-feeding How should I use this medication? This drug is given as an infusion into a vein. It is administered in a hospitalor clinic by a specially trained health care professional. Talk to your pediatrician regarding the use of this medicine in children.Special care may be needed. Overdosage: If you think you have taken too much of this medicine contact apoison control center or emergency room at once. NOTE: This medicine is only for you. Do not share this medicine with others. What if I miss a dose? It is important not to miss your dose. Call your doctor or health careprofessional if you are unable to keep an appointment. What may interact with this medication? Do not take this medicine with any of the following medications: live virus vaccines This medicine may also interact with the following medications: aprepitant certain antibiotics like erythromycin or clarithromycin certain antivirals for HIV or hepatitis certain medicines for fungal infections like fluconazole, itraconazole, ketoconazole, posaconazole, or voriconazole cimetidine ciprofloxacin conivaptan cyclosporine dronedarone fluvoxamine grapefruit juice imatinib verapamil This list may not describe all possible interactions. Give your health care provider a list of all the medicines, herbs, non-prescription drugs, or dietary supplements you use. Also tell them if you smoke, drink alcohol, or use illegaldrugs. Some items may interact with your medicine. What should I watch for while using this medication? Your condition will be monitored carefully while you are receiving this  medicine. You will need important blood work done while you are taking thismedicine. Call your doctor or health care professional for advice if you get a fever, chills or sore throat, or other symptoms of a cold or flu. Do not treat yourself. This drug decreases your body's ability to fight infections. Try toavoid being around people who are sick. Some products may contain alcohol. Ask your health care professional if this medicine contains alcohol. Be sure to tell all health care professionals you are taking this medicine. Certain medicines, like metronidazole and disulfiram, can cause an unpleasant reaction when taken with alcohol. The reaction includes flushing, headache, nausea, vomiting, sweating, and increased thirst. Thereaction can last from 30 minutes to several hours. You may get drowsy or dizzy. Do not drive, use machinery, or do anything that needs mental alertness until you know how this medicine affects you. Do not stand or sit up quickly, especially if you are an older patient. This reduces the risk of dizzy or fainting spells. Alcohol may interfere with the effect ofthis medicine. Talk to your health care professional about your risk of cancer. You may bemore at risk for certain types of cancer if you take this medicine. Do not become pregnant while taking this medicine or for 6 months after stopping it. Women should inform their doctor if they wish to become pregnant or think they might be pregnant. There is a potential for serious side effects to an unborn child. Talk to your health care professional or  pharmacist for more information. Do not breast-feed an infant while taking this medicine orfor 1 week after stopping it. Males who get this medicine must use a condom during sex with females who can get pregnant. If you get a woman pregnant, the baby could have birth defects. The baby could die before they are born. You will need to continue wearing a condom for 3 months after stopping the  medicine. Tell your health care providerright away if your partner becomes pregnant while you are taking this medicine. This may interfere with the ability to father a child. You should talk to yourdoctor or health care professional if you are concerned about your fertility. What side effects may I notice from receiving this medication? Side effects that you should report to your doctor or health care professionalas soon as possible: allergic reactions like skin rash, itching or hives, swelling of the face, lips, or tongue blurred vision breathing problems changes in vision low blood counts - This drug may decrease the number of white blood cells, red blood cells and platelets. You may be at increased risk for infections and bleeding. nausea and vomiting pain, redness or irritation at site where injected pain, tingling, numbness in the hands or feet redness, blistering, peeling, or loosening of the skin, including inside the mouth signs of decreased platelets or bleeding - bruising, pinpoint red spots on the skin, black, tarry stools, nosebleeds signs of decreased red blood cells - unusually weak or tired, fainting spells, lightheadedness signs of infection - fever or chills, cough, sore throat, pain or difficulty passing urine swelling of the ankle, feet, hands Side effects that usually do not require medical attention (report to yourdoctor or health care professional if they continue or are bothersome): constipation diarrhea fingernail or toenail changes hair loss loss of appetite mouth sores muscle pain This list may not describe all possible side effects. Call your doctor for medical advice about side effects. You may report side effects to FDA at1-800-FDA-1088. Where should I keep my medication? This drug is given in a hospital or clinic and will not be stored at home. NOTE: This sheet is a summary. It may not cover all possible information. If you have questions about this medicine,  talk to your doctor, pharmacist, orhealth care provider.  2022 Elsevier/Gold Standard (2019-02-09 19:50:31)  Cyclophosphamide Injection What is this medication? CYCLOPHOSPHAMIDE (sye kloe FOSS fa mide) is a chemotherapy drug. It slows the growth of cancer cells. This medicine is used to treat many types of cancer like lymphoma, myeloma, leukemia, breast cancer, and ovarian cancer, to name afew. This medicine may be used for other purposes; ask your health care provider orpharmacist if you have questions. COMMON BRAND NAME(S): Cytoxan, Neosar What should I tell my care team before I take this medication? They need to know if you have any of these conditions: heart disease history of irregular heartbeat infection kidney disease liver disease low blood counts, like white cells, platelets, or red blood cells on hemodialysis recent or ongoing radiation therapy scarring or thickening of the lungs trouble passing urine an unusual or allergic reaction to cyclophosphamide, other medicines, foods, dyes, or preservatives pregnant or trying to get pregnant breast-feeding How should I use this medication? This drug is usually given as an injection into a vein or muscle or by infusion into a vein. It is administered in a hospital or clinic by a specially trainedhealth care professional. Talk to your pediatrician regarding the use of this medicine in children.Special care may be  needed. Overdosage: If you think you have taken too much of this medicine contact apoison control center or emergency room at once. NOTE: This medicine is only for you. Do not share this medicine with others. What if I miss a dose? It is important not to miss your dose. Call your doctor or health careprofessional if you are unable to keep an appointment. What may interact with this medication? amphotericin B azathioprine certain antivirals for HIV or hepatitis certain medicines for blood pressure, heart disease, irregular  heart beat certain medicines that treat or prevent blood clots like warfarin certain other medicines for cancer cyclosporine etanercept indomethacin medicines that relax muscles for surgery medicines to increase blood counts metronidazole This list may not describe all possible interactions. Give your health care provider a list of all the medicines, herbs, non-prescription drugs, or dietary supplements you use. Also tell them if you smoke, drink alcohol, or use illegaldrugs. Some items may interact with your medicine. What should I watch for while using this medication? Your condition will be monitored carefully while you are receiving thismedicine. You may need blood work done while you are taking this medicine. Drink water or other fluids as directed. Urinate often, even at night. Some products may contain alcohol. Ask your health care professional if this medicine contains alcohol. Be sure to tell all health care professionals you are taking this medicine. Certain medicines, like metronidazole and disulfiram, can cause an unpleasant reaction when taken with alcohol. The reaction includes flushing, headache, nausea, vomiting, sweating, and increased thirst. Thereaction can last from 30 minutes to several hours. Do not become pregnant while taking this medicine or for 1 year after stopping it. Women should inform their health care professional if they wish to become pregnant or think they might be pregnant. Men should not father a child while taking this medicine and for 4 months after stopping it. There is potential for serious side effects to an unborn child. Talk to your health care professionalfor more information. Do not breast-feed an infant while taking this medicine or for 1 week afterstopping it. This medicine has caused ovarian failure in some women. This medicine may make it more difficult to get pregnant. Talk to your health care professional if [place] concerned about your  fertility. This medicine has caused decreased sperm counts in some men. This may make it more difficult to father a child. Talk to your health care professional if [place] concerned about your fertility. Call your health care professional for advice if you get a fever, chills, or sore throat, or other symptoms of a cold or flu. Do not treat yourself. This medicine decreases your body's ability to fight infections. Try to avoid beingaround people who are sick. Avoid taking medicines that contain aspirin, acetaminophen, ibuprofen, naproxen, or ketoprofen unless instructed by your health care professional.These medicines may hide a fever. Talk to your health care professional about your risk of cancer. You may bemore at risk for certain types of cancer if you take this medicine. If you are going to need surgery or other procedure, tell your health careprofessional that you are using this medicine. Be careful brushing or flossing your teeth or using a toothpick because you may get an infection or bleed more easily. If you have any dental work done, Primary school teacher you are receiving this medicine. What side effects may I notice from receiving this medication? Side effects that you should report to your doctor or health care professionalas soon as possible: allergic reactions like skin  rash, itching or hives, swelling of the face, lips, or tongue breathing problems nausea, vomiting signs and symptoms of bleeding such as bloody or black, tarry stools; red or dark brown urine; spitting up blood or brown material that looks like coffee grounds; red spots on the skin; unusual bruising or bleeding from the eyes, gums, or nose signs and symptoms of heart failure like fast, irregular heartbeat, sudden weight gain; swelling of the ankles, feet, hands signs and symptoms of infection like fever; chills; cough; sore throat; pain or trouble passing urine signs and symptoms of kidney injury like trouble passing urine or  change in the amount of urine signs and symptoms of liver injury like dark yellow or brown urine; general ill feeling or flu-like symptoms; light-colored stools; loss of appetite; nausea; right upper belly pain; unusually weak or tired; yellowing of the eyes or skin Side effects that usually do not require medical attention (report to yourdoctor or health care professional if they continue or are bothersome): confusion decreased hearing diarrhea facial flushing hair loss headache loss of appetite missed menstrual periods signs and symptoms of low red blood cells or anemia such as unusually weak or tired; feeling faint or lightheaded; falls skin discoloration This list may not describe all possible side effects. Call your doctor for medical advice about side effects. You may report side effects to FDA at1-800-FDA-1088. Where should I keep my medication? This drug is given in a hospital or clinic and will not be stored at home. NOTE: This sheet is a summary. It may not cover all possible information. If you have questions about this medicine, talk to your doctor, pharmacist, orhealth care provider.  2022 Elsevier/Gold Standard (2018-12-15 09:53:29)

## 2020-09-30 ENCOUNTER — Telehealth: Payer: Self-pay | Admitting: Hematology

## 2020-09-30 NOTE — Telephone Encounter (Signed)
Scheduled follow-up appointments per 7/7 los. Patient is aware. 

## 2020-10-01 ENCOUNTER — Other Ambulatory Visit: Payer: Self-pay

## 2020-10-01 ENCOUNTER — Inpatient Hospital Stay: Payer: BC Managed Care – PPO

## 2020-10-01 VITALS — BP 130/76 | HR 81 | Temp 98.1°F | Resp 18 | Ht 60.0 in

## 2020-10-01 DIAGNOSIS — Z5111 Encounter for antineoplastic chemotherapy: Secondary | ICD-10-CM | POA: Diagnosis not present

## 2020-10-01 DIAGNOSIS — Z95828 Presence of other vascular implants and grafts: Secondary | ICD-10-CM

## 2020-10-01 DIAGNOSIS — C50412 Malignant neoplasm of upper-outer quadrant of left female breast: Secondary | ICD-10-CM

## 2020-10-01 DIAGNOSIS — Z170 Estrogen receptor positive status [ER+]: Secondary | ICD-10-CM

## 2020-10-01 MED ORDER — PEGFILGRASTIM-CBQV 6 MG/0.6ML ~~LOC~~ SOSY
6.0000 mg | PREFILLED_SYRINGE | Freq: Once | SUBCUTANEOUS | Status: AC
  Administered 2020-10-01: 6 mg via SUBCUTANEOUS

## 2020-10-01 MED ORDER — SODIUM CHLORIDE 0.9 % IV SOLN
Freq: Once | INTRAVENOUS | Status: AC
  Filled 2020-10-01: qty 250

## 2020-10-02 NOTE — Progress Notes (Signed)
[facility]   [phone] [phone]   Clinic Follow up Note   Patient Care Team: Parker, Caleb M, MD as PCP - General (Family Medicine) Stuart, Dawn C, RN as Oncology Nurse Navigator Martini, Keisha N, RN as Oncology Nurse Navigator Byerly, Faera, MD as Consulting Physician (General Surgery) Moody, John, MD as Consulting Physician (Radiation Oncology) Feng, Yan, MD as Consulting Physician (Hematology) 10/03/2020  CHIEF COMPLAINT: Follow up left breast cancer   SUMMARY OF ONCOLOGIC HISTORY: Oncology History Overview Note  Cancer Staging Malignant neoplasm of upper-outer quadrant of left breast, estrogen receptor positive (HCC) Staging form: Breast, AJCC 8th Edition - Clinical stage from 05/31/2020: Stage IIA (cT2, cN0, cM0, G3, ER+, PR+, HER2-) - Signed by Feng, Yan, MD on 06/01/2020 Stage prefix: Initial diagnosis    Malignant neoplasm of upper-outer quadrant of left breast, estrogen receptor positive (HCC)  05/17/2020 Mammogram   IMPRESSION: 1. Highly suspicious 2.3 cm mass involving the UPPER OUTER QUADRANT of the LEFT breast at the 1 o'clock position approximately 8 cm from the nipple. 2. No pathologic LEFT axillary lymphadenopathy. 3. No mammographic evidence of malignancy involving the RIGHT breast.   05/17/2020 Initial Biopsy   Diagnosis Breast, left, needle core biopsy, 1 o'clock - INVASIVE MAMMARY CARCINOMA.  Microscopic Comment The carcinoma appears grade 3. The greatest linear extent of tumor in any one core is 10 mm. E-cadherin will be reported separately. Ancillary studies will be reported separately. Results reported to [facility] on 05/18/2020. Dr. Manny reviewed the case. ADDENDUM: Immunohistochemistry for E-cadherin is positive supporting a ductal phenotype.    05/17/2020 Receptors her2   PROGNOSTIC INDICATORS Results: IMMUNOHISTOCHEMICAL AND MORPHOMETRIC ANALYSIS PERFORMED MANUALLY The tumor cells  are NEGATIVE for Her2 (1+). Estrogen Receptor: 90%, POSITIVE, STRONG STAINING INTENSITY Progesterone Receptor: 60%, POSITIVE, STRONG STAINING INTENSITY Proliferation Marker Ki67: 70%   05/31/2020 Initial Diagnosis   Malignant neoplasm of upper-outer quadrant of left breast, estrogen receptor positive (HCC)    05/31/2020 Cancer Staging   Staging form: Breast, AJCC 8th Edition - Clinical stage from 05/31/2020: Stage IIA (cT2, cN0, cM0, G3, ER+, PR+, HER2-) - Signed by Feng, Yan, MD on 06/01/2020  Stage prefix: Initial diagnosis    06/03/2020 Cancer Staging   Staging form: Breast, AJCC 8th Edition - Pathologic stage from 06/03/2020: Stage IIA (pT2, pN1a, cM0, G3, ER+, PR+, HER2-) - Signed by Feng, Yan, MD on 06/29/2020  Stage prefix: Initial diagnosis  Multigene prognostic tests performed: MammaPrint  Histologic grading system: 3 grade system    06/03/2020 Surgery   Left breast lumpectomy with left axillary sentinel lymph node  biopsies  A. 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  - High grade ductal carcinoma in situ.  - Invasive carcinoma is at the inferior margin focally and <0.1 cm from  the anterior margin focally.  - In situ carcinoma is <0.1 cm from the anterior margin focally.  - Biopsy site.  - See oncology table.   B. 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   C. LYMPH NODE, LEFT AXILLARY, #2, SENTINEL, EXCISION:  - One of one lymph nodes negative for carcinoma (0/1).   07/20/2020 Imaging   CT CAP  IMPRESSION: 1. Surgical clips in the area of the LEFT breast laterally along the margin of water density well-circumscribed area that measures approximately 4.2 x 3.7 cm. This is likely a postoperative seroma. Correlate   with any pain or developing symptoms that would suggest infection. No gas or secondary findings to suggest this at this time. 2. No evidence of  metastatic disease in the chest, abdomen or pelvis. 3. Hepatic steatosis without focal lesion. 4. Calcified coronary artery disease. 5. Aortic atherosclerosis. 6. Mildly nodular thyroid. 7. Lesion in the RIGHT thyroid measuring up to 1.8 cm. Recommend thyroid US (ref: J Am Coll Radiol. 2015 Feb;12(2): 143-50).     07/20/2020 Imaging   Bone Scan  IMPRESSION: Signs of spondylosis of the thoracic and lumbar spine. No scintigraphic evidence of metastatic disease.     07/27/2020 Surgery   RE-EXCISION OF LEFT BREAST LUMPECTOMY and PAC placement by Dr Byerly  FINAL MICROSCOPIC DIAGNOSIS:   A. BREAST, LEFT ANTERIOR MARGIN, EXCISION:  - Prior procedure site changes.  No carcinoma identified.   B. BREAST, LEFT INFERIOR MARGIN, EXCISION:  - Prior procedure site changes.  No carcinoma identified.    07/27/2020 Pathology Results   FINAL MICROSCOPIC DIAGNOSIS:   A. BREAST, LEFT ANTERIOR MARGIN, EXCISION:  - Prior procedure site changes.  No carcinoma identified.   B. BREAST, LEFT INFERIOR MARGIN, EXCISION:  - Prior procedure site changes.  No carcinoma identified.     08/18/2020 -  Chemotherapy   docetaxel and Cytoxan (TC) Q3weeks for 4 cycles.       CURRENT THERAPY: Docetaxel and Cytoxan (TC) q3 weeks x4 cycles starting 08/18/20 - 10/20/20, dose reduced with cycle 2   INTERVAL HISTORY: Ms. Raupp returns for follow up and treatment as scheduled. She completed cycle 3 09/29/20, GCSF and fluids on 7/9. She is here for f/up and additional IVF.  She feels much better with this cycle and scheduled IV fluids.  Still tired, but active at home.  She plans to go back to work tomorrow.  She has a "pinching" sensation in the bladder with each chemo.  Denies dysuria, hematuria, urgency, or frequency.  Her taste is altered but appetite is normal, eating and drinking.  Denies mucositis, N/V/C/D.  She has a headache, has not eaten yet today.  She thinks this is making her nauseated.  She has mild intermittent  tingling in the fingertips that began with the first chemo, no functional difficulties.  Has not started gabapentin for hot flashes yet.  Denies fever, chills, cough, chest pain, dyspnea, leg edema, or new concerns.   MEDICAL HISTORY:  Past Medical History:  Diagnosis Date   Abnormal pap 2009   PT HAD SURGERY BUT DON'T KNOW WHAT TYPE   Breast cancer (HCC) 04/27/2020   Headache(784.0)    otc meds prn   Hypertension    Hyperthyroidism    SVD (spontaneous vaginal delivery)    x 2    SURGICAL HISTORY: Past Surgical History:  Procedure Laterality Date   BREAST LUMPECTOMY WITH RADIOACTIVE SEED AND SENTINEL LYMPH NODE BIOPSY Left 06/03/2020   Procedure: RADIOACTIVE SEED GUIDED LEFT BREAST LUMPECTOMY, LEFT AXILLARY SENTINEL LYMPH NODE BIOPSY;  Surgeon: Byerly, Faera, MD;  Location: [facility] OR;  Service: General;  Laterality: Left;   FOOT SURGERY     left   PORTACATH PLACEMENT Left 07/27/2020   Procedure: INSERTION PORT-A-CATH;  Surgeon: Byerly, Faera, MD;  Location: [facility] OR;  Service: General;  Laterality: Left;   RE-EXCISION OF BREAST LUMPECTOMY Left 07/27/2020   Procedure: RE-EXCISION OF LEFT BREAST LUMPECTOMY;  Surgeon: Byerly, Faera, MD;  Location: [facility] OR;  Service: General;  Laterality: Left;   TUBAL LIGATION     WISDOM TOOTH EXTRACTION        I have reviewed the social history and family history with the patient and they are unchanged from previous note.  ALLERGIES:  is allergic to sulfa antibiotics.  MEDICATIONS:  Current Outpatient Medications  Medication Sig Dispense Refill   acetaminophen (TYLENOL) 325 MG tablet Take 2 tablets (650 mg total) by mouth every 6 (six) hours as needed for mild pain (or Fever >/= 101). 20 tablet 0   acetaminophen-codeine (TYLENOL #3) 300-30 MG tablet Take 1 tablet by mouth every 8 (eight) hours as needed for moderate pain. 30 tablet 0   calcium carbonate (TUMS - DOSED IN MG ELEMENTAL CALCIUM) 500 MG chewable tablet Chew 1 tablet (200 mg of elemental calcium  total) by mouth 2 (two) times daily. 30 tablet 0   dexamethasone (DECADRON) 4 MG tablet Take 1 tablet (4 mg total) by mouth 2 (two) times daily. Start the day before Taxotere. Then again 1 tab daily the day after chemo for 3 days. 30 tablet 1   gabapentin (NEURONTIN) 100 MG capsule Take 1 capsule (100 mg total) by mouth 3 (three) times daily. 30 capsule 0   lidocaine-prilocaine (EMLA) cream Apply to affected area as needed (Patient taking differently: Apply 1 application topically as needed (port access).) 30 g 3   lisinopril (ZESTRIL) 20 MG tablet Take 1 tablet (20 mg total) by mouth daily. 30 tablet 0   ondansetron (ZOFRAN) 8 MG tablet Take 1 tablet (8 mg total) by mouth 2 (two) times daily as needed for refractory nausea / vomiting. Start on day 3 after chemo. 30 tablet 1   pantoprazole (PROTONIX) 40 MG tablet Take 1 tablet (40 mg total) by mouth daily. 30 tablet 0   prochlorperazine (COMPAZINE) 10 MG tablet Take 1 tablet (10 mg total) by mouth every 6 (six) hours as needed (Nausea or vomiting). 30 tablet 1   sucralfate (CARAFATE) 1 g tablet Take 1 tablet (1 g total) by mouth 4 (four) times daily -  with meals and at bedtime. 60 tablet 2   Current Facility-Administered Medications  Medication Dose Route Frequency Provider Last Rate Last Admin   acetaminophen (TYLENOL) tablet 650 mg  650 mg Oral Once Burton, Lacie K, NP       Facility-Administered Medications Ordered in Other Visits  Medication Dose Route Frequency Provider Last Rate Last Admin   sodium chloride flush (NS) 0.9 % injection 10 mL  10 mL Intracatheter PRN Feng, Yan, MD   10 mL at 09/08/20 1437    PHYSICAL EXAMINATION: ECOG PERFORMANCE STATUS: 1 - Symptomatic but completely ambulatory  Vitals:   10/03/20 0917  BP: 115/68  Pulse: 96  Resp: 20  Temp: (!) 97.5 F (36.4 C)  SpO2: 100%   Filed Weights   10/03/20 0917  Weight: 167 lb 3.2 oz (75.8 kg)    GENERAL:alert, no distress and comfortable SKIN: no rash  EYES:   sclera clear OROPHARYNX: no thrush or ulcers LUNGS:  normal breathing effort HEART:  no lower extremity edema NEURO: alert & oriented x 3 with fluent speech, no focal motor/sensory deficits PAC without erythema Breast exam deferred   LABORATORY DATA:  I have reviewed the data as listed CBC Latest Ref Rng & Units 09/29/2020 09/12/2020 09/08/2020  WBC 4.0 - 10.5 K/uL 7.3 8.4 11.3(H)  Hemoglobin 12.0 - 15.0 g/dL 11.6(L) 11.3(L) 11.3(L)  Hematocrit 36.0 - 46.0 % 35.3(L) 36.3 34.8(L)  Platelets 150 - 400 K/uL 335 254 478(H)     CMP Latest Ref Rng & Units 09/29/2020 09/12/2020 09/08/2020    Glucose 70 - 99 mg/dL 130(H) 78 177(H)  BUN 6 - 20 mg/dL 17 16 16  Creatinine 0.44 - 1.00 mg/dL 0.68 0.60 0.73  Sodium 135 - 145 mmol/L 141 137 139  Potassium 3.5 - 5.1 mmol/L 4.4 4.0 3.9  Chloride 98 - 111 mmol/L 108 108 108  CO2 22 - 32 mmol/L 24 21(L) 23  Calcium 8.9 - 10.3 mg/dL 9.3 8.2(L) 9.2  Total Protein 6.5 - 8.1 g/dL 6.7 5.7(L) 6.8  Total Bilirubin 0.3 - 1.2 mg/dL 0.3 0.8 0.2(L)  Alkaline Phos 38 - 126 U/L 102 68 80  AST 15 - 41 U/L 33 15 14(L)  ALT 0 - 44 U/L 58(H) 17 25      RADIOGRAPHIC STUDIES: I have personally reviewed the radiological images as listed and agreed with the findings in the report. No results found.   ASSESSMENT & PLAN: Kelsha L Vigen is a 55 y.o. female with   1. Malignant neoplasm of upper-outer quadrant of left breast, Stage IIA, pT2N1M0, stage IIA, ER+/PR+/HER2-, Grade III, Mammaprint high risk -She underwent Left breast lumpectomy with left axillary sentinel lymph node biopsies on 06/03/20 under Dr. Byerly. Pathology showed: 2.4 cm invasive ductal carcinoma, grade 3; DCIS; one positive lymph node (1/2); positive inferior margin. Her Mammaprint showed high risk luminal type.  -She underwent re-excision surgery on 07/27/20 with Dr Byerly. Path showed no residual cancer. -Her CT CAP and bone scan from 07/27/20 were negative for distant mets. -To reduce her risk of cancer  recurrence, she began adjuvant chemo with docetaxel and Cytoxan (TC) Q3weeks for 4 cycles starting 08/18/20.  -Tolerated cycle 1 poorly with severe stomach pain on day 5 requiring hospitalization. She tolerated cycle 2 better with dose reduction  - added IV Ativan from C2 due to severe headache from [employer].  -S/p cycle 3 with same dose reduction and scheduled IVF on day 3, 5 and 6.  Tolerated much better  2. Symptom management: Stomach pain, Dehydration, early neuropathy G1, pinching bladder sensation -secondary to TC. Exacerbated by eating and drinking. -Protonix alone has not helped.  She began sucralfate, bland diet was recommended  -can continue Tylenol #3. -Recent HTN medicine change to Lisinopril without HCTZ on 09/12/20.  Tolerating well, no leg edema -She continues to be well hydrated with IVF on days 3, 5, and 6 of each cycle  -Following cycle 3 she reports mild intermittent tingling starting with first chemo, no functional difficulties.  Will monitor. -She developed transient "pinching" sensation in the bladder with chemo. No other UTI like sx or hematuria. ? Cytoxan-related. Pt declined UA/culture. Will monitor and push liquids  3. Hot flashes -She was post-menopausal on 12/2017 labs with Gyn DR Elmira Powell -She started having hot flashes in Fall 2021. Her gyn started on her Estradial patch in 04/2020 which helps very well. Due to ER/PR positive disease, she stopped in March.  -She experienced vomiting with Effexor. -Her hot flashes have worsened recently. Has not started gabapentin yet   4. Social Support, Anxiety  -She is married with 2 adult daughters. Her family is aware of her breast cancer diagnosis. -She is working on coping better with diagnosis and chemo.    5. She declined Genetic Testing (08/10/20)  Disposition:  Ms. Feltner appears stable. She completed cycle 3 dose-reduced TC with GCSF and IV fluid support. She tolerated well with mild fatigue and headache.  Side  effects are well managed with supportive care at home and scheduled fluids. Plan to continue same dose/regimen   for final cycle 4 on 7/28.   The bladder sensation is possibly irritation from cytoxan. She denies hematuria or UTI symptoms. She declined UA/culture. Will monitor for now. I encouraged her to continue adequate hydration.  She plans to start gabapentin for hot flashes this week, will also likely help early neuropathy.   09/29/20 labs reviewed. Proceed with IVF today 500 cc NS over 1 hour, tylenol for headache, and zofran if needed.   She will return tomorrow for 1/2 L NS over 1 hour.   F/up and final cycle 4 TC on 7/28  All questions were answered. The patient knows to call the clinic with any problems, questions or concerns. No barriers to learning were detected. Total encounter time was 30 minutes.      Lacie K Burton, NP 10/03/20     

## 2020-10-03 ENCOUNTER — Other Ambulatory Visit: Payer: Self-pay

## 2020-10-03 ENCOUNTER — Inpatient Hospital Stay: Payer: BC Managed Care – PPO

## 2020-10-03 ENCOUNTER — Encounter: Payer: Self-pay | Admitting: Nurse Practitioner

## 2020-10-03 ENCOUNTER — Inpatient Hospital Stay (HOSPITAL_BASED_OUTPATIENT_CLINIC_OR_DEPARTMENT_OTHER): Payer: BC Managed Care – PPO | Admitting: Nurse Practitioner

## 2020-10-03 VITALS — BP 115/68 | HR 96 | Temp 97.5°F | Resp 20 | Ht 60.0 in | Wt 167.2 lb

## 2020-10-03 DIAGNOSIS — Z95828 Presence of other vascular implants and grafts: Secondary | ICD-10-CM

## 2020-10-03 DIAGNOSIS — C50412 Malignant neoplasm of upper-outer quadrant of left female breast: Secondary | ICD-10-CM | POA: Diagnosis not present

## 2020-10-03 DIAGNOSIS — Z170 Estrogen receptor positive status [ER+]: Secondary | ICD-10-CM | POA: Diagnosis not present

## 2020-10-03 DIAGNOSIS — Z5111 Encounter for antineoplastic chemotherapy: Secondary | ICD-10-CM | POA: Diagnosis not present

## 2020-10-03 MED ORDER — ACETAMINOPHEN 325 MG PO TABS
650.0000 mg | ORAL_TABLET | Freq: Four times a day (QID) | ORAL | Status: DC | PRN
  Administered 2020-10-03: 650 mg via ORAL

## 2020-10-03 MED ORDER — ACETAMINOPHEN 325 MG PO TABS
ORAL_TABLET | ORAL | Status: AC
  Filled 2020-10-03: qty 2

## 2020-10-03 MED ORDER — SODIUM CHLORIDE 0.9 % IV SOLN
Freq: Once | INTRAVENOUS | Status: AC
  Filled 2020-10-03: qty 250

## 2020-10-03 MED ORDER — SODIUM CHLORIDE 0.9 % IV SOLN: 8.0000 mg | Freq: Once | INTRAVENOUS | Status: DC

## 2020-10-03 MED ORDER — ONDANSETRON HCL 4 MG/2ML IJ SOLN
INTRAMUSCULAR | Status: AC
  Filled 2020-10-03: qty 4

## 2020-10-03 MED ORDER — SODIUM CHLORIDE 0.9% FLUSH
10.0000 mL | Freq: Once | INTRAVENOUS | Status: AC | PRN
  Administered 2020-10-03: 10 mL
  Filled 2020-10-03: qty 10

## 2020-10-03 MED ORDER — ONDANSETRON HCL 4 MG/2ML IJ SOLN
8.0000 mg | Freq: Once | INTRAMUSCULAR | Status: AC
Start: 2020-10-03 — End: 2020-10-03
  Administered 2020-10-03: 8 mg via INTRAVENOUS

## 2020-10-03 MED ORDER — HEPARIN SOD (PORK) LOCK FLUSH 100 UNIT/ML IV SOLN
500.0000 [IU] | Freq: Once | INTRAVENOUS | Status: AC | PRN
  Administered 2020-10-03: 500 [IU]
  Filled 2020-10-03: qty 5

## 2020-10-03 MED ORDER — ACETAMINOPHEN 325 MG PO TABS: 650.0000 mg | ORAL_TABLET | Freq: Once | ORAL | Status: DC

## 2020-10-03 NOTE — Patient Instructions (Signed)
Nausea, Adult Nausea is the feeling that you have an upset stomach or that you are about to vomit. Nausea on its own is not usually a serious concern, but it may be an early sign of a more serious medical problem. As nausea gets worse, it can lead to vomiting. If vomiting develops, or if you are not able to drink enough fluids, you are at risk of becoming dehydrated. Dehydration can make you tired and thirsty, cause you to have a dry mouth, and decrease how often you urinate. Older adults and people with other diseases or a weak disease-fighting system (immune system) are at higher risk for dehydration. The main goals of treating your nausea are: To relieve your nausea. To limit repeated nausea episodes. To prevent vomiting and dehydration. Follow these instructions at home: Watch your symptoms for any changes. Tell your health care provider about them.Follow these instructions as told by your health care provider. Eating and drinking     Take an oral rehydration solution (ORS). This is a drink that is sold at pharmacies and retail stores. Drink clear fluids slowly and in small amounts as you are able. Clear fluids include water, ice chips, low-calorie sports drinks, and fruit juice that has water added (diluted fruit juice). Eat bland, easy-to-digest foods in small amounts as you are able. These foods include bananas, applesauce, rice, lean meats, toast, and crackers. Avoid drinking fluids that contain a lot of sugar or caffeine, such as energy drinks, sports drinks, and soda. Avoid alcohol. Avoid spicy or fatty foods. General instructions Take over-the-counter and prescription medicines only as told by your health care provider. Rest at home while you recover. Drink enough fluid to keep your urine pale yellow. Breathe slowly and deeply when you feel nauseous. Avoid smelling things that have strong odors. Wash your hands often using soap and water. If soap and water are not available, use  hand sanitizer. Make sure that all people in your household wash their hands well and often. Keep all follow-up visits as told by your health care provider. This is important. Contact a health care provider if: Your nausea gets worse. Your nausea does not go away after two days. You vomit. You cannot drink fluids without vomiting. You have any of the following: New symptoms. A fever. A headache. Muscle cramps. A rash. Pain while urinating. You feel light-headed or dizzy. Get help right away if: You have pain in your chest, neck, arm, or jaw. You feel extremely weak or you faint. You have vomit that is bright red or looks like coffee grounds. You have bloody or black stools or stools that look like tar. You have a severe headache, a stiff neck, or both. You have severe pain, cramping, or bloating in your abdomen. You have difficulty breathing or are breathing very quickly. Your heart is beating very quickly. Your skin feels cold and clammy. You feel confused. You have signs of dehydration, such as: Dark urine, very little urine, or no urine. Cracked lips. Dry mouth. Sunken eyes. Sleepiness. Weakness. These symptoms may represent a serious problem that is an emergency. Do not wait to see if the symptoms will go away. Get medical help right away. Call your local emergency services (911 in the [place] Do not drive yourself to the hospital. Summary Nausea is the feeling that you have an upset stomach or that you are about to vomit. Nausea on its own is not usually a serious concern, but it may be an early sign of a  more serious medical problem. If vomiting develops, or if you are not able to drink enough fluids, you are at risk of becoming dehydrated. Follow recommendations for eating and drinking and take over-the-counter and prescription medicines only as told by your health care provider. Contact a health care provider right away if your symptoms worsen or you have new  symptoms. Keep all follow-up visits as told by your health care provider. This is important. This information is not intended to replace advice given to you by your health care provider. Make sure you discuss any questions you have with your healthcare provider. Document Revised: 02/10/2019 Document Reviewed: 08/20/2017 Elsevier Patient Education  [employer].

## 2020-10-04 ENCOUNTER — Inpatient Hospital Stay: Payer: BC Managed Care – PPO

## 2020-10-04 VITALS — BP 116/59 | HR 102 | Temp 98.7°F | Resp 18

## 2020-10-04 DIAGNOSIS — Z95828 Presence of other vascular implants and grafts: Secondary | ICD-10-CM

## 2020-10-04 DIAGNOSIS — Z5111 Encounter for antineoplastic chemotherapy: Secondary | ICD-10-CM | POA: Diagnosis not present

## 2020-10-04 MED ORDER — HEPARIN SOD (PORK) LOCK FLUSH 100 UNIT/ML IV SOLN
500.0000 [IU] | Freq: Once | INTRAVENOUS | Status: AC
Start: 2020-10-04 — End: 2020-10-04
  Administered 2020-10-04: 500 [IU]
  Filled 2020-10-04: qty 5

## 2020-10-04 MED ORDER — SODIUM CHLORIDE 0.9 % IV SOLN
Freq: Once | INTRAVENOUS | Status: AC
Start: 2020-10-04 — End: 2020-10-04
  Filled 2020-10-04: qty 250

## 2020-10-04 MED ORDER — SODIUM CHLORIDE 0.9% FLUSH
10.0000 mL | Freq: Once | INTRAVENOUS | Status: AC
  Administered 2020-10-04: 10 mL
  Filled 2020-10-04: qty 10

## 2020-10-04 NOTE — Patient Instructions (Signed)

## 2020-10-05 ENCOUNTER — Telehealth: Payer: Self-pay | Admitting: Hematology

## 2020-10-05 NOTE — Telephone Encounter (Signed)
Scheduled follow-up appointments per 7/11 los. Patient is aware. 

## 2020-10-20 ENCOUNTER — Other Ambulatory Visit: Payer: Self-pay

## 2020-10-20 ENCOUNTER — Inpatient Hospital Stay: Payer: BC Managed Care – PPO

## 2020-10-20 ENCOUNTER — Encounter: Payer: Self-pay | Admitting: *Deleted

## 2020-10-20 ENCOUNTER — Other Ambulatory Visit: Payer: BC Managed Care – PPO

## 2020-10-20 ENCOUNTER — Ambulatory Visit: Payer: BC Managed Care – PPO

## 2020-10-20 ENCOUNTER — Ambulatory Visit: Payer: BC Managed Care – PPO | Admitting: Nurse Practitioner

## 2020-10-20 ENCOUNTER — Inpatient Hospital Stay (HOSPITAL_BASED_OUTPATIENT_CLINIC_OR_DEPARTMENT_OTHER): Payer: BC Managed Care – PPO | Admitting: Nurse Practitioner

## 2020-10-20 ENCOUNTER — Encounter: Payer: Self-pay | Admitting: Nurse Practitioner

## 2020-10-20 DIAGNOSIS — Z170 Estrogen receptor positive status [ER+]: Secondary | ICD-10-CM

## 2020-10-20 DIAGNOSIS — C50412 Malignant neoplasm of upper-outer quadrant of left female breast: Secondary | ICD-10-CM

## 2020-10-20 DIAGNOSIS — Z95828 Presence of other vascular implants and grafts: Secondary | ICD-10-CM

## 2020-10-20 DIAGNOSIS — Z5111 Encounter for antineoplastic chemotherapy: Secondary | ICD-10-CM | POA: Diagnosis not present

## 2020-10-20 LAB — CBC WITH DIFFERENTIAL (CANCER CENTER ONLY)
Abs Immature Granulocytes: 0.04 10*3/uL (ref 0.00–0.07)
Basophils Absolute: 0 10*3/uL (ref 0.0–0.1)
Basophils Relative: 0 %
Eosinophils Absolute: 0 10*3/uL (ref 0.0–0.5)
Eosinophils Relative: 0 %
HCT: 33 % — ABNORMAL LOW (ref 36.0–46.0)
Hemoglobin: 10.7 g/dL — ABNORMAL LOW (ref 12.0–15.0)
Immature Granulocytes: 0 %
Lymphocytes Relative: 7 %
Lymphs Abs: 0.7 10*3/uL (ref 0.7–4.0)
MCH: 28.2 pg (ref 26.0–34.0)
MCHC: 32.4 g/dL (ref 30.0–36.0)
MCV: 87.1 fL (ref 80.0–100.0)
Monocytes Absolute: 0.4 10*3/uL (ref 0.1–1.0)
Monocytes Relative: 4 %
Neutro Abs: 8.4 10*3/uL — ABNORMAL HIGH (ref 1.7–7.7)
Neutrophils Relative %: 89 %
Platelet Count: 383 10*3/uL (ref 150–400)
RBC: 3.79 MIL/uL — ABNORMAL LOW (ref 3.87–5.11)
RDW: 16.4 % — ABNORMAL HIGH (ref 11.5–15.5)
WBC Count: 9.5 10*3/uL (ref 4.0–10.5)
nRBC: 0 % (ref 0.0–0.2)

## 2020-10-20 LAB — CMP (CANCER CENTER ONLY)
ALT: 15 U/L (ref 0–44)
AST: 16 U/L (ref 15–41)
Albumin: 3.2 g/dL — ABNORMAL LOW (ref 3.5–5.0)
Alkaline Phosphatase: 91 U/L (ref 38–126)
Anion gap: 9 (ref 5–15)
BUN: 17 mg/dL (ref 6–20)
CO2: 23 mmol/L (ref 22–32)
Calcium: 9.3 mg/dL (ref 8.9–10.3)
Chloride: 107 mmol/L (ref 98–111)
Creatinine: 0.75 mg/dL (ref 0.44–1.00)
GFR, Estimated: >60 mL/min (ref >60)
Glucose, Bld: 171 mg/dL — ABNORMAL HIGH (ref 70–99)
Potassium: 4.3 mmol/L (ref 3.5–5.1)
Sodium: 139 mmol/L (ref 135–145)
Total Bilirubin: 0.2 mg/dL — ABNORMAL LOW (ref 0.3–1.2)
Total Protein: 6.4 g/dL — ABNORMAL LOW (ref 6.5–8.1)

## 2020-10-20 MED ORDER — LORAZEPAM 2 MG/ML IJ SOLN
INTRAMUSCULAR | Status: AC
  Filled 2020-10-20: qty 1

## 2020-10-20 MED ORDER — SODIUM CHLORIDE 0.9 % IV SOLN
10.0000 mg | Freq: Once | INTRAVENOUS | Status: AC
  Administered 2020-10-20: 10 mg via INTRAVENOUS
  Filled 2020-10-20: qty 10

## 2020-10-20 MED ORDER — SODIUM CHLORIDE 0.9% FLUSH
10.0000 mL | INTRAVENOUS | Status: DC | PRN
  Administered 2020-10-20: 10 mL
  Filled 2020-10-20: qty 10

## 2020-10-20 MED ORDER — LORAZEPAM 2 MG/ML IJ SOLN
2.0000 mg | Freq: Once | INTRAMUSCULAR | Status: AC
  Administered 2020-10-20: 2 mg via INTRAVENOUS

## 2020-10-20 MED ORDER — SODIUM CHLORIDE 0.9 % IV SOLN
60.0000 mg/m2 | Freq: Once | INTRAVENOUS | Status: AC
  Administered 2020-10-20: 110 mg via INTRAVENOUS
  Filled 2020-10-20: qty 11

## 2020-10-20 MED ORDER — ONDANSETRON HCL 8 MG PO TABS: 8.0000 mg | ORAL_TABLET | Freq: Two times a day (BID) | ORAL | 1 refills | Status: DC | PRN

## 2020-10-20 MED ORDER — PALONOSETRON HCL INJECTION 0.25 MG/5ML
0.2500 mg | Freq: Once | INTRAVENOUS | Status: AC
  Administered 2020-10-20: 0.25 mg via INTRAVENOUS

## 2020-10-20 MED ORDER — SODIUM CHLORIDE 0.9 % IV SOLN
150.0000 mg | Freq: Once | INTRAVENOUS | Status: AC
  Administered 2020-10-20: 150 mg via INTRAVENOUS
  Filled 2020-10-20: qty 150

## 2020-10-20 MED ORDER — HEPARIN SOD (PORK) LOCK FLUSH 100 UNIT/ML IV SOLN
500.0000 [IU] | Freq: Once | INTRAVENOUS | Status: AC | PRN
  Administered 2020-10-20: 500 [IU]
  Filled 2020-10-20: qty 5

## 2020-10-20 MED ORDER — SODIUM CHLORIDE 0.9 % IV SOLN
500.0000 mg/m2 | Freq: Once | INTRAVENOUS | Status: AC
  Administered 2020-10-20: 880 mg via INTRAVENOUS
  Filled 2020-10-20: qty 44

## 2020-10-20 MED ORDER — PALONOSETRON HCL INJECTION 0.25 MG/5ML
INTRAVENOUS | Status: AC
  Filled 2020-10-20: qty 5

## 2020-10-20 MED ORDER — GABAPENTIN 100 MG PO CAPS: 100.0000 mg | ORAL_CAPSULE | Freq: Three times a day (TID) | ORAL | 0 refills | Status: DC

## 2020-10-20 MED ORDER — SODIUM CHLORIDE 0.9% FLUSH
10.0000 mL | Freq: Once | INTRAVENOUS | Status: AC
Start: 2020-10-20 — End: 2020-10-20
  Administered 2020-10-20: 10 mL
  Filled 2020-10-20: qty 10

## 2020-10-20 MED ORDER — SODIUM CHLORIDE 0.9 % IV SOLN
Freq: Once | INTRAVENOUS | Status: AC
  Filled 2020-10-20: qty 250

## 2020-10-20 MED ORDER — ACETAMINOPHEN-CODEINE #3 300-30 MG PO TABS: 1.0000 | ORAL_TABLET | Freq: Three times a day (TID) | ORAL | 0 refills | Status: DC | PRN

## 2020-10-20 NOTE — Progress Notes (Signed)
[place]   [phone] [phone]   Clinic Follow up Note   Patient Care Team: Vivi Barrack, MD as PCP - General (Family Medicine) Mauro Kaufmann, RN as Oncology Nurse Navigator Rockwell Germany, RN as Oncology Nurse Navigator [name], MD as Consulting Physician (General Surgery) Kyung Rudd, MD as Consulting Physician (Radiation Oncology) Truitt Merle, MD as Consulting Physician (Hematology) 10/20/2020  CHIEF COMPLAINT: Follow up left breast cancer   SUMMARY OF ONCOLOGIC HISTORY: Oncology History Overview Note  Cancer Staging Malignant neoplasm of upper-outer quadrant of left breast, estrogen receptor positive ([place]) Staging form: Breast, AJCC 8th Edition - Clinical stage from 05/31/2020: Stage IIA (cT2, cN0, cM0, G3, ER+, PR+, HER2-) - Signed by Truitt Merle, MD on 06/01/2020 Stage prefix: Initial diagnosis    Malignant neoplasm of upper-outer quadrant of left breast, estrogen receptor positive ([place])  05/17/2020 Mammogram   IMPRESSION: 1. Highly suspicious 2.3 cm mass involving the UPPER OUTER QUADRANT of the LEFT breast at the 1 o'clock position approximately 8 cm from the nipple. 2. No pathologic LEFT axillary lymphadenopathy. 3. No mammographic evidence of malignancy involving the RIGHT breast.   05/17/2020 Initial Biopsy   Diagnosis Breast, left, needle core biopsy, 1 o'clock - INVASIVE MAMMARY CARCINOMA.  Microscopic Comment The carcinoma appears grade 3. The greatest linear extent of tumor in any one core is 10 mm. E-cadherin will be reported separately. Ancillary studies will be reported separately. Results reported to [facility] on 05/18/2020. Dr. Tresa Moore reviewed the case. ADDENDUM: Immunohistochemistry for E-cadherin is positive supporting a ductal phenotype.    05/17/2020 Receptors her2   PROGNOSTIC INDICATORS Results: IMMUNOHISTOCHEMICAL AND MORPHOMETRIC ANALYSIS PERFORMED MANUALLY The tumor cells  are NEGATIVE for Her2 (1+). Estrogen Receptor: 90%, POSITIVE, STRONG STAINING INTENSITY Progesterone Receptor: 60%, POSITIVE, STRONG STAINING INTENSITY Proliferation Marker Ki67: 70%   05/31/2020 Initial Diagnosis   Malignant neoplasm of upper-outer quadrant of left breast, estrogen receptor positive ([place])    05/31/2020 Cancer Staging   Staging form: Breast, AJCC 8th Edition - Clinical stage from 05/31/2020: Stage IIA (cT2, cN0, cM0, G3, ER+, PR+, HER2-) - Signed by Truitt Merle, MD on 06/01/2020  Stage prefix: Initial diagnosis    06/03/2020 Cancer Staging   Staging form: Breast, AJCC 8th Edition - Pathologic stage from 06/03/2020: Stage IIA (pT2, pN1a, cM0, G3, ER+, PR+, HER2-) - Signed by Truitt Merle, MD on 06/29/2020  Stage prefix: Initial diagnosis  Multigene prognostic tests performed: MammaPrint  Histologic grading system: 3 grade system    06/03/2020 Surgery   Left breast lumpectomy with left axillary sentinel lymph node  biopsies  A. 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  - High grade ductal carcinoma in situ.  - Invasive carcinoma is at the inferior margin focally and <0.1 cm from  the anterior margin focally.  - In situ carcinoma is <0.1 cm from the anterior margin focally.  - Biopsy site.  - See oncology table.   B. 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   C. LYMPH NODE, LEFT AXILLARY, #2, SENTINEL, EXCISION:  - One of one lymph nodes negative for carcinoma (0/1).   07/20/2020 Imaging   CT CAP  IMPRESSION: 1. Surgical clips in the area of the LEFT breast laterally along the margin of water density well-circumscribed area that measures approximately 4.2 x 3.7 cm. This is likely a postoperative seroma. Correlate  with any pain or developing symptoms that would suggest infection. No gas or secondary findings to suggest this at this time. 2. No evidence of  metastatic disease in the chest, abdomen or pelvis. 3. Hepatic steatosis without focal lesion. 4. Calcified coronary artery disease. 5. Aortic atherosclerosis. 6. Mildly nodular thyroid. 7. Lesion in the RIGHT thyroid measuring up to 1.8 cm. Recommend thyroid US (ref: J Am Coll Radiol. 2015 Feb;12(2): 143-50).     07/20/2020 Imaging   Bone Scan  IMPRESSION: Signs of spondylosis of the thoracic and lumbar spine. No scintigraphic evidence of metastatic disease.     07/27/2020 Surgery   RE-EXCISION OF LEFT BREAST LUMPECTOMY and PAC placement by Dr Barry Dienes  FINAL MICROSCOPIC DIAGNOSIS:   A. BREAST, LEFT ANTERIOR MARGIN, EXCISION:  - Prior procedure site changes.  No carcinoma identified.   B. BREAST, LEFT INFERIOR MARGIN, EXCISION:  - Prior procedure site changes.  No carcinoma identified.    07/27/2020 Pathology Results   FINAL MICROSCOPIC DIAGNOSIS:   A. BREAST, LEFT ANTERIOR MARGIN, EXCISION:  - Prior procedure site changes.  No carcinoma identified.   B. BREAST, LEFT INFERIOR MARGIN, EXCISION:  - Prior procedure site changes.  No carcinoma identified.     08/18/2020 -  Chemotherapy   docetaxel and Cytoxan (TC) Q3weeks for 4 cycles.       CURRENT THERAPY: Docetaxel and Cytoxan (TC) q3 weeks x4 cycles starting 08/18/20 - 10/20/20, dose reduced with cycle 2. GCSF and IVF on day 3, additional scheduled fluids on days 5 and 6  INTERVAL HISTORY: Ms. Mcgraw returns for follow up and treatment as scheduled. S/p cycle 3 TC on 09/29/20, last seen by me 10/03/20 for toxicity check and scheduled fluids.  She had more side effects with cycle 3 including fatigue, weakness, and more leg aches after injection.  Not sure she took Claritin long enough.  She was able to continue working.  She had mild dyspnea with minimal exertion which has improved.  Nausea began on day 5 and has been persistent, no vomiting.  Compazine is not effective and she misplaced Zofran.  She continues to eat and drink because  she knows she has to.  Bowels moving with each meal.  She feels a "pinching" sensation in the bladder, denies dysuria, frequency, urgency, or hematuria.  Denies fever or chills.  Denies new cough, chest pain, dyspnea, neuropathy, mucositis, rash, or other concerns.   MEDICAL HISTORY:  Past Medical History:  Diagnosis Date   Abnormal pap 2009   PT HAD SURGERY BUT DON'T KNOW WHAT TYPE   Breast cancer ([place]) 04/27/2020   Headache(784.0)    otc meds prn   Hypertension    Hyperthyroidism    SVD (spontaneous vaginal delivery)    x 2    SURGICAL HISTORY: Past Surgical History:  Procedure Laterality Date   BREAST LUMPECTOMY WITH RADIOACTIVE SEED AND SENTINEL LYMPH NODE BIOPSY Left 06/03/2020   Procedure: RADIOACTIVE SEED GUIDED LEFT BREAST LUMPECTOMY, LEFT AXILLARY SENTINEL LYMPH NODE BIOPSY;  Surgeon: [name], MD;  Location: [facility];  Service: General;  Laterality: Left;   FOOT SURGERY     left   PORTACATH PLACEMENT Left 07/27/2020   Procedure: INSERTION PORT-A-CATH;  Surgeon: [name], MD;  Location: [place] [facility];  Service: General;  Laterality: Left;   RE-EXCISION OF BREAST LUMPECTOMY Left 07/27/2020   Procedure: RE-EXCISION OF LEFT BREAST LUMPECTOMY;  Surgeon: [name], MD;  Location: [place];  Service: General;  Laterality: Left;   TUBAL LIGATION  WISDOM TOOTH EXTRACTION      I have reviewed the social history and family history with the patient and they are unchanged from previous note.  ALLERGIES:  is allergic to sulfa antibiotics.  MEDICATIONS:  Current Outpatient Medications  Medication Sig Dispense Refill   acetaminophen-codeine (TYLENOL #3) 300-30 MG tablet Take 1 tablet by mouth every 8 (eight) hours as needed for moderate pain. 30 tablet 0   dexamethasone (DECADRON) 4 MG tablet Take 1 tablet (4 mg total) by mouth 2 (two) times daily. Start the day before Taxotere. Then again 1 tab daily the day after chemo for 3 days. 30 tablet 1   gabapentin (NEURONTIN) 100 MG capsule  Take 1 capsule (100 mg total) by mouth 3 (three) times daily. 30 capsule 0   lisinopril (ZESTRIL) 20 MG tablet Take 1 tablet (20 mg total) by mouth daily. 30 tablet 0   ondansetron (ZOFRAN) 8 MG tablet Take 1 tablet (8 mg total) by mouth 2 (two) times daily as needed for refractory nausea / vomiting. Start on day 3 after chemo. 30 tablet 1   pantoprazole (PROTONIX) 40 MG tablet Take 1 tablet (40 mg total) by mouth daily. 30 tablet 0   prochlorperazine (COMPAZINE) 10 MG tablet Take 1 tablet (10 mg total) by mouth every 6 (six) hours as needed (Nausea or vomiting). 30 tablet 1   sucralfate (CARAFATE) 1 g tablet Take 1 tablet (1 g total) by mouth 4 (four) times daily -  with meals and at bedtime. 60 tablet 2   No current facility-administered medications for this visit.   Facility-Administered Medications Ordered in Other Visits  Medication Dose Route Frequency Provider Last Rate Last Admin   cyclophosphamide (CYTOXAN) 880 mg in sodium chloride 0.9 % 250 mL chemo infusion  500 mg/m2 (Treatment Plan Recorded) Intravenous Once Truitt Merle, MD 588 mL/hr at 10/20/20 1305 880 mg at 10/20/20 1305   heparin lock flush 100 unit/mL  500 Units Intracatheter Once PRN Truitt Merle, MD       sodium chloride flush (NS) 0.9 % injection 10 mL  10 mL Intracatheter PRN Truitt Merle, MD   10 mL at 09/08/20 1437   sodium chloride flush (NS) 0.9 % injection 10 mL  10 mL Intracatheter PRN Truitt Merle, MD        PHYSICAL EXAMINATION: ECOG PERFORMANCE STATUS: 1 - Symptomatic but completely ambulatory  Vitals:   10/20/20 0937  BP: (!) 147/82  Pulse: 99  Resp: 19  Temp: 98 F (36.7 C)  SpO2: 100%   Filed Weights   10/20/20 0937  Weight: 169 lb 3.2 oz (76.7 kg)    GENERAL:alert, no distress and comfortable SKIN: No rash EYES: sclera clear LUNGS: normal breathing effort HEART: Mild bilateral ankle edema NEURO: alert & oriented x 3 with fluent speech, no focal motor/sensory deficits PAC without erythema  LABORATORY  DATA:  I have reviewed the data as listed CBC Latest Ref Rng & Units 10/20/2020 09/29/2020 09/12/2020  WBC 4.0 - 10.5 K/uL 9.5 7.3 8.4  Hemoglobin 12.0 - 15.0 g/dL 10.7(L) 11.6(L) 11.3(L)  Hematocrit 36.0 - 46.0 % 33.0(L) 35.3(L) 36.3  Platelets 150 - 400 K/uL 383 335 254     CMP Latest Ref Rng & Units 10/20/2020 09/29/2020 09/12/2020  Glucose 70 - 99 mg/dL 171(H) 130(H) 78  BUN 6 - 20 mg/dL _0 Creatinine 0.44 - 1.00 mg/dL 0.75 0.68 0.60  Sodium 135 - 145 mmol/L 139 141 137  Potassium 3.5 - 5.1 mmol/L  4.3 4.4 4.0  Chloride 98 - 111 mmol/L 107 108 108  CO2 22 - 32 mmol/L 23 24 21(L)  Calcium 8.9 - 10.3 mg/dL 9.3 9.3 8.2(L)  Total Protein 6.5 - 8.1 g/dL 6.4(L) 6.7 5.7(L)  Total Bilirubin 0.3 - 1.2 mg/dL 0.2(L) 0.3 0.8  Alkaline Phos 38 - 126 U/L 91 102 68  AST 15 - 41 U/L 16 33 15  ALT 0 - 44 U/L 15 58(H) 17      RADIOGRAPHIC STUDIES: I have personally reviewed the radiological images as listed and agreed with the findings in the report. No results found.   ASSESSMENT & PLAN: GREENLEE ANCHETA is a 55 y.o. female with   1. Malignant neoplasm of upper-outer quadrant of left breast, Stage IIA, pT2N1M0, stage IIA, ER+/PR+/HER2-, Grade III, Mammaprint high risk -She underwent Left breast lumpectomy with left axillary sentinel lymph node biopsies on 06/03/20 under Dr. Barry Dienes. Pathology showed: 2.4 cm invasive ductal carcinoma, grade 3; DCIS; one positive lymph node (1/2); positive inferior margin. Her Mammaprint showed high risk luminal type.  -She underwent re-excision surgery on 07/27/20 with Dr Barry Dienes. Path showed no residual cancer. -Her CT CAP and bone scan from 07/27/20 were negative for distant mets. -To reduce her risk of cancer recurrence, she began adjuvant chemo with docetaxel and Cytoxan (TC) Q3weeks for 4 cycles starting 08/18/20.  -Tolerated cycle 1 poorly with severe stomach pain on day 5 requiring hospitalization. She tolerated cycle 2 better with dose reduction  - added IV Ativan  from C2 due to severe headache from [employer].  -S/p cycle 3 with same dose reduction and scheduled IVF on day 3, 5 and 6.  She had more nausea, bone pain, and fatigue but eventually recovered   2. Symptom management: Stomach pain, Dehydration, early neuropathy G1, pinching bladder sensation -secondary to TC. Exacerbated by eating and drinking. -Protonix alone has not helped.  She began sucralfate, bland diet was recommended  -can continue Tylenol #3. -Recent HTN medicine change to Lisinopril without HCTZ on 09/12/20.  Tolerating well, no leg edema -She continues to be well hydrated with IVF on days 3, 5, and 6 of each cycle -Following cycle 3 she reports mild intermittent tingling starting with first chemo, no functional difficulties.  Will monitor. -She developed transient "pinching" sensation in the bladder with chemo. No other UTI like sx or hematuria. ? Cytoxan-related. Pt declined UA/culture. Will monitor and push liquids.  She can try oral Azo   3. Hot flashes -She was post-menopausal on 12/2017 labs with [place] -She started having hot flashes in Fall 2021. Her gyn started on her Estradial patch in 04/2020 which helps very well. Due to ER/PR positive disease, she stopped in March.  -She experienced vomiting with Effexor. -Her hot flashes have worsened recently.  She went to pick up gabapentin but pharmacist told her it was not indicated for hot flashes.  We reviewed indications, I refilled and she will start it -She understands hot flashes may worsen on antiestrogen, will monitor closely   4. Social Support, Anxiety  -She is married with 2 adult daughters. Her family is aware of her breast cancer diagnosis. -She is working on coping better with diagnosis and chemo.    5. She declined Genetic Testing (08/10/20)   Disposition: Ms. Mcmillion appears stable.  She completed 3 cycles of adjuvant TC and G-CSF.  She tolerated last cycle moderately well, with more nausea, fatigue, and  bone pain.  I refilled Zofran and will  add M-End with premeds today.  She remains able to work with more effort, and functional.  Continue Claritin and Tylenol 3 for bone pain.  She is otherwise able to recover and function well.  Labs reviewed, adequate to proceed with final cycle 4 adjuvant TC today as planned, same dose.  She will return for G-CSF on day 3, and IV fluids on days 3, 5, and 6 with anti-emetics PRN.   We reviewed her upcoming treatment plan, which will include adjuvant radiation next, followed by antiestrogen therapy.  I will refer her back to Dr. Lisbeth Renshaw.  I refilled zofran (nausea), neurontin (hot flashes), and tylenol #3 (bone pain).  Follow-up 8/1.  All questions were answered. The patient knows to call the clinic with any problems, questions or concerns. No barriers to learning were detected.  Total encounter time was 30 minutes.    Alla Feeling, NP 10/20/20

## 2020-10-20 NOTE — Patient Instructions (Signed)
[facility] MEDICAL ONCOLOGY  Discharge Instructions: Thank you for choosing [facility] to provide your oncology and hematology care.   If you have a lab appointment with the Cancer Center, please go directly to the Cancer Center and check in at the registration area.   Wear comfortable clothing and clothing appropriate for easy access to any Portacath or PICC line.   We strive to give you quality time with your provider. You may need to reschedule your appointment if you arrive late (15 or more minutes).  Arriving late affects you and other patients whose appointments are after yours.  Also, if you miss three or more appointments without notifying the office, you may be dismissed from the clinic at the provider's discretion.      For prescription refill requests, have your pharmacy contact our office and allow 72 hours for refills to be completed.    Today you received the following chemotherapy and/or immunotherapy agents: Docetaxel (Taxotere) and Cyclophosphamide (Cytoxan)   To help prevent nausea and vomiting after your treatment, we encourage you to take your nausea medication as directed.  BELOW ARE SYMPTOMS THAT SHOULD BE REPORTED IMMEDIATELY: *FEVER GREATER THAN 100.4 F (38 C) OR HIGHER *CHILLS OR SWEATING *NAUSEA AND VOMITING THAT IS NOT CONTROLLED WITH YOUR NAUSEA MEDICATION *UNUSUAL SHORTNESS OF BREATH *UNUSUAL BRUISING OR BLEEDING *URINARY PROBLEMS (pain or burning when urinating, or frequent urination) *BOWEL PROBLEMS (unusual diarrhea, constipation, pain near the anus) TENDERNESS IN MOUTH AND THROAT WITH OR WITHOUT PRESENCE OF ULCERS (sore throat, sores in mouth, or a toothache) UNUSUAL RASH, SWELLING OR PAIN  UNUSUAL VAGINAL DISCHARGE OR ITCHING   Items with * indicate a potential emergency and should be followed up as soon as possible or go to the Emergency Department if any problems should occur.  Please show the CHEMOTHERAPY ALERT CARD or  IMMUNOTHERAPY ALERT CARD at check-in to the Emergency Department and triage nurse.  Should you have questions after your visit or need to cancel or reschedule your appointment, please contact [facility]  Dept: [phone]  and follow the prompts.  Office hours are 8:00 a.m. to 4:30 p.m. Monday - Friday. Please note that voicemails left after 4:00 p.m. may not be returned until the following business day.  We are closed weekends and major holidays. You have access to a nurse at all times for urgent questions. Please call the main number to the clinic Dept: [phone] and follow the prompts.   For any non-urgent questions, you may also contact your provider using MyChart. We now offer e-Visits for anyone 55 and older to request care online for non-urgent symptoms. For details visit [email or web address].   Also download the MyChart app! Go to the app store, search "MyChart", open the app, select , and log in with your MyChart username and password.  Due to Covid, a mask is required upon entering the hospital/clinic. If you do not have a mask, one will be given to you upon arrival. For doctor visits, patients may have 1 support person aged 55 or older with them. For treatment visits, patients cannot have anyone with them due to current Covid guidelines and our immunocompromised population.  

## 2020-10-22 ENCOUNTER — Inpatient Hospital Stay: Payer: BC Managed Care – PPO

## 2020-10-22 ENCOUNTER — Other Ambulatory Visit: Payer: Self-pay

## 2020-10-22 VITALS — BP 136/75 | HR 85 | Temp 98.1°F | Resp 20 | Ht 60.0 in

## 2020-10-22 DIAGNOSIS — R110 Nausea: Secondary | ICD-10-CM

## 2020-10-22 DIAGNOSIS — Z95828 Presence of other vascular implants and grafts: Secondary | ICD-10-CM

## 2020-10-22 DIAGNOSIS — Z5111 Encounter for antineoplastic chemotherapy: Secondary | ICD-10-CM | POA: Diagnosis not present

## 2020-10-22 MED ORDER — SODIUM CHLORIDE 0.9% FLUSH
10.0000 mL | Freq: Once | INTRAVENOUS | Status: AC | PRN
  Administered 2020-10-22: 10 mL
  Filled 2020-10-22: qty 10

## 2020-10-22 MED ORDER — SODIUM CHLORIDE 0.9 % IV SOLN
Freq: Once | INTRAVENOUS | Status: AC
  Filled 2020-10-22: qty 250

## 2020-10-22 MED ORDER — ONDANSETRON HCL 8 MG PO TABS
ORAL_TABLET | ORAL | Status: AC
  Filled 2020-10-22: qty 1

## 2020-10-22 MED ORDER — ONDANSETRON HCL 8 MG PO TABS
8.0000 mg | ORAL_TABLET | Freq: Once | ORAL | Status: AC
Start: 2020-10-22 — End: 2020-10-22
  Administered 2020-10-22: 8 mg via ORAL

## 2020-10-22 MED ORDER — SODIUM CHLORIDE 0.9 % IV SOLN: 8.0000 mg | Freq: Once | INTRAVENOUS | Status: DC

## 2020-10-22 MED ORDER — HEPARIN SOD (PORK) LOCK FLUSH 100 UNIT/ML IV SOLN
500.0000 [IU] | Freq: Once | INTRAVENOUS | Status: AC | PRN
  Administered 2020-10-22: 500 [IU]
  Filled 2020-10-22: qty 5

## 2020-10-22 NOTE — Progress Notes (Signed)
Mrs. Telford declined her Ellen Henri shot for today.  Treatment Plan day completed to keep plan up to day.  Gardiner Rhyme, RN

## 2020-10-22 NOTE — Patient Instructions (Signed)

## 2020-10-24 ENCOUNTER — Inpatient Hospital Stay: Payer: BC Managed Care – PPO

## 2020-10-24 ENCOUNTER — Telehealth: Payer: Self-pay | Admitting: Radiation Oncology

## 2020-10-24 ENCOUNTER — Other Ambulatory Visit: Payer: Self-pay

## 2020-10-24 ENCOUNTER — Encounter: Payer: Self-pay | Admitting: Hematology

## 2020-10-24 ENCOUNTER — Inpatient Hospital Stay: Payer: BC Managed Care – PPO | Attending: Hematology | Admitting: Hematology

## 2020-10-24 VITALS — BP 133/88 | HR 102 | Temp 98.5°F | Resp 18 | Ht 60.0 in | Wt 166.9 lb

## 2020-10-24 DIAGNOSIS — R112 Nausea with vomiting, unspecified: Secondary | ICD-10-CM | POA: Diagnosis not present

## 2020-10-24 DIAGNOSIS — K760 Fatty (change of) liver, not elsewhere classified: Secondary | ICD-10-CM | POA: Diagnosis not present

## 2020-10-24 DIAGNOSIS — E042 Nontoxic multinodular goiter: Secondary | ICD-10-CM | POA: Diagnosis not present

## 2020-10-24 DIAGNOSIS — R232 Flushing: Secondary | ICD-10-CM | POA: Insufficient documentation

## 2020-10-24 DIAGNOSIS — I700 Atherosclerosis of aorta: Secondary | ICD-10-CM | POA: Insufficient documentation

## 2020-10-24 DIAGNOSIS — I10 Essential (primary) hypertension: Secondary | ICD-10-CM | POA: Diagnosis not present

## 2020-10-24 DIAGNOSIS — Z79899 Other long term (current) drug therapy: Secondary | ICD-10-CM | POA: Diagnosis not present

## 2020-10-24 DIAGNOSIS — Z170 Estrogen receptor positive status [ER+]: Secondary | ICD-10-CM | POA: Diagnosis not present

## 2020-10-24 DIAGNOSIS — I2510 Atherosclerotic heart disease of native coronary artery without angina pectoris: Secondary | ICD-10-CM | POA: Diagnosis not present

## 2020-10-24 DIAGNOSIS — E860 Dehydration: Secondary | ICD-10-CM | POA: Insufficient documentation

## 2020-10-24 DIAGNOSIS — C50412 Malignant neoplasm of upper-outer quadrant of left female breast: Secondary | ICD-10-CM | POA: Diagnosis not present

## 2020-10-24 NOTE — Progress Notes (Signed)
[place]   [phone] [phone]   Clinic Follow up Note   Patient Care Team: Vivi Barrack, MD as PCP - General (Family Medicine) Mauro Kaufmann, RN as Oncology Nurse Navigator Rockwell Germany, RN as Oncology Nurse Navigator [name], MD as Consulting Physician (General Surgery) Kyung Rudd, MD as Consulting Physician (Radiation Oncology) Truitt Merle, MD as Consulting Physician (Hematology)  Date of Service:  10/24/2020  CHIEF COMPLAINT: f/u of left breast cancer  SUMMARY OF ONCOLOGIC HISTORY: Oncology History Overview Note  Cancer Staging Malignant neoplasm of upper-outer quadrant of left breast, estrogen receptor positive ([place]) Staging form: Breast, AJCC 8th Edition - Clinical stage from 05/31/2020: Stage IIA (cT2, cN0, cM0, G3, ER+, PR+, HER2-) - Signed by Truitt Merle, MD on 06/01/2020 Stage prefix: Initial diagnosis    Malignant neoplasm of upper-outer quadrant of left breast, estrogen receptor positive ([place])  05/17/2020 Mammogram   IMPRESSION: 1. Highly suspicious 2.3 cm mass involving the UPPER OUTER QUADRANT of the LEFT breast at the 1 o'clock position approximately 8 cm from the nipple. 2. No pathologic LEFT axillary lymphadenopathy. 3. No mammographic evidence of malignancy involving the RIGHT breast.   05/17/2020 Initial Biopsy   Diagnosis Breast, left, needle core biopsy, 1 o'clock - INVASIVE MAMMARY CARCINOMA.  Microscopic Comment The carcinoma appears grade 3. The greatest linear extent of tumor in any one core is 10 mm. E-cadherin will be reported separately. Ancillary studies will be reported separately. Results reported to [facility] on 05/18/2020. Dr. Tresa Moore reviewed the case. ADDENDUM: Immunohistochemistry for E-cadherin is positive supporting a ductal phenotype.    05/17/2020 Receptors her2   PROGNOSTIC INDICATORS Results: IMMUNOHISTOCHEMICAL AND MORPHOMETRIC ANALYSIS PERFORMED MANUALLY The  tumor cells are NEGATIVE for Her2 (1+). Estrogen Receptor: 90%, POSITIVE, STRONG STAINING INTENSITY Progesterone Receptor: 60%, POSITIVE, STRONG STAINING INTENSITY Proliferation Marker Ki67: 70%   05/31/2020 Initial Diagnosis   Malignant neoplasm of upper-outer quadrant of left breast, estrogen receptor positive ([place])    05/31/2020 Cancer Staging   Staging form: Breast, AJCC 8th Edition - Clinical stage from 05/31/2020: Stage IIA (cT2, cN0, cM0, G3, ER+, PR+, HER2-) - Signed by Truitt Merle, MD on 06/01/2020  Stage prefix: Initial diagnosis    06/03/2020 Cancer Staging   Staging form: Breast, AJCC 8th Edition - Pathologic stage from 06/03/2020: Stage IIA (pT2, pN1a, cM0, G3, ER+, PR+, HER2-) - Signed by Truitt Merle, MD on 06/29/2020  Stage prefix: Initial diagnosis  Multigene prognostic tests performed: MammaPrint  Histologic grading system: 3 grade system    06/03/2020 Surgery   Left breast lumpectomy with left axillary sentinel lymph node  biopsies  A. 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  - High grade ductal carcinoma in situ.  - Invasive carcinoma is at the inferior margin focally and <0.1 cm from  the anterior margin focally.  - In situ carcinoma is <0.1 cm from the anterior margin focally.  - Biopsy site.  - See oncology table.   B. 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   C. LYMPH NODE, LEFT AXILLARY, #2, SENTINEL, EXCISION:  - One of one lymph nodes negative for carcinoma (0/1).   07/20/2020 Imaging   CT CAP  IMPRESSION: 1. Surgical clips in the area of the LEFT breast laterally along the margin of water density well-circumscribed area that measures approximately 4.2 x 3.7 cm. This is likely  a postoperative seroma. Correlate with any pain or developing symptoms that would suggest infection. No gas or secondary findings to suggest this at this time. 2. No  evidence of metastatic disease in the chest, abdomen or pelvis. 3. Hepatic steatosis without focal lesion. 4. Calcified coronary artery disease. 5. Aortic atherosclerosis. 6. Mildly nodular thyroid. 7. Lesion in the RIGHT thyroid measuring up to 1.8 cm. Recommend thyroid US (ref: J Am Coll Radiol. 2015 Feb;12(2): 143-50).     07/20/2020 Imaging   Bone Scan  IMPRESSION: Signs of spondylosis of the thoracic and lumbar spine. No scintigraphic evidence of metastatic disease.     07/27/2020 Surgery   RE-EXCISION OF LEFT BREAST LUMPECTOMY and PAC placement by Dr Barry Dienes  FINAL MICROSCOPIC DIAGNOSIS:   A. BREAST, LEFT ANTERIOR MARGIN, EXCISION:  - Prior procedure site changes.  No carcinoma identified.   B. BREAST, LEFT INFERIOR MARGIN, EXCISION:  - Prior procedure site changes.  No carcinoma identified.    07/27/2020 Pathology Results   FINAL MICROSCOPIC DIAGNOSIS:   A. BREAST, LEFT ANTERIOR MARGIN, EXCISION:  - Prior procedure site changes.  No carcinoma identified.   B. BREAST, LEFT INFERIOR MARGIN, EXCISION:  - Prior procedure site changes.  No carcinoma identified.     08/18/2020 -  Chemotherapy   docetaxel and Cytoxan (TC) Q3weeks for 4 cycles.        CURRENT THERAPY:  docetaxel and Cytoxan (TC) Q3weeks for 4 cycles starting 08/18/20-10/20/20, dose reduced with C2  INTERVAL HISTORY:  Sherry Carrillo is here for a follow up of breast cancer. She was last seen by me on 09/29/20 and by NP Lacie twice in the interim. She presents to the clinic alone. She reports fatigue and nausea. She notes she vomited this morning and became nauseated again while here. She reports she is feeling better overall without the Udenyca shot last cycle. She would like to cancel IVF for today but keep appointment for tomorrow. She notes she is not going to work today or tomorrow. She notes she is trying to rest up.  She reminded me today that she is going to [place] in a month from today. She is planning to  begin radiation therapy after she returns from her trip. She would like to have her port removed before she leaves.  All other systems were reviewed with the patient and are negative.  MEDICAL HISTORY:  Past Medical History:  Diagnosis Date   Abnormal pap 2009   PT HAD SURGERY BUT DON'T KNOW WHAT TYPE   Breast cancer ([place]) 04/27/2020   Headache(784.0)    otc meds prn   Hypertension    Hyperthyroidism    SVD (spontaneous vaginal delivery)    x 2    SURGICAL HISTORY: Past Surgical History:  Procedure Laterality Date   BREAST LUMPECTOMY WITH RADIOACTIVE SEED AND SENTINEL LYMPH NODE BIOPSY Left 06/03/2020   Procedure: RADIOACTIVE SEED GUIDED LEFT BREAST LUMPECTOMY, LEFT AXILLARY SENTINEL LYMPH NODE BIOPSY;  Surgeon: [name], MD;  Location: [facility];  Service: General;  Laterality: Left;   FOOT SURGERY     left   PORTACATH PLACEMENT Left 07/27/2020   Procedure: INSERTION PORT-A-CATH;  Surgeon: [name], MD;  Location: [facility];  Service: General;  Laterality: Left;   RE-EXCISION OF BREAST LUMPECTOMY Left 07/27/2020   Procedure: RE-EXCISION OF LEFT BREAST LUMPECTOMY;  Surgeon: [name], MD;  Location: [facility];  Service: General;  Laterality: Left;   TUBAL LIGATION     WISDOM TOOTH EXTRACTION  I have reviewed the social history and family history with the patient and they are unchanged from previous note.  ALLERGIES:  is allergic to sulfa antibiotics.  MEDICATIONS:  Current Outpatient Medications  Medication Sig Dispense Refill   acetaminophen-codeine (TYLENOL #3) 300-30 MG tablet Take 1 tablet by mouth every 8 (eight) hours as needed for moderate pain. 30 tablet 0   dexamethasone (DECADRON) 4 MG tablet Take 1 tablet (4 mg total) by mouth 2 (two) times daily. Start the day before Taxotere. Then again 1 tab daily the day after chemo for 3 days. 30 tablet 1   gabapentin (NEURONTIN) 100 MG capsule Take 1 capsule (100 mg total) by mouth 3 (three) times daily. 30 capsule 0    lisinopril (ZESTRIL) 20 MG tablet Take 1 tablet (20 mg total) by mouth daily. 30 tablet 0   ondansetron (ZOFRAN) 8 MG tablet Take 1 tablet (8 mg total) by mouth 2 (two) times daily as needed for refractory nausea / vomiting. Start on day 3 after chemo. 30 tablet 1   pantoprazole (PROTONIX) 40 MG tablet Take 1 tablet (40 mg total) by mouth daily. 30 tablet 0   prochlorperazine (COMPAZINE) 10 MG tablet Take 1 tablet (10 mg total) by mouth every 6 (six) hours as needed (Nausea or vomiting). 30 tablet 1   sucralfate (CARAFATE) 1 g tablet Take 1 tablet (1 g total) by mouth 4 (four) times daily -  with meals and at bedtime. 60 tablet 2   No current facility-administered medications for this visit.   Facility-Administered Medications Ordered in Other Visits  Medication Dose Route Frequency Provider Last Rate Last Admin   sodium chloride flush (NS) 0.9 % injection 10 mL  10 mL Intracatheter PRN Truitt Merle, MD   10 mL at 09/08/20 1437    PHYSICAL EXAMINATION: ECOG PERFORMANCE STATUS: 1 - Symptomatic but completely ambulatory  Vitals:   10/24/20 1355  BP: 133/88  Pulse: (!) 102  Resp: 18  Temp: 98.5 F (36.9 C)  SpO2: 100%   Filed Weights   10/24/20 1355  Weight: 166 lb 14.4 oz (75.7 kg)    GENERAL:alert, no distress and comfortable SKIN: skin color normal, no rashes or significant lesions EYES: normal, Conjunctiva are pink and non-injected, sclera clear  NEURO: alert & oriented x 3 with fluent speech  LABORATORY DATA:  I have reviewed the data as listed CBC Latest Ref Rng & Units 10/20/2020 09/29/2020 09/12/2020  WBC 4.0 - 10.5 K/uL 9.5 7.3 8.4  Hemoglobin 12.0 - 15.0 g/dL 10.7(L) 11.6(L) 11.3(L)  Hematocrit 36.0 - 46.0 % 33.0(L) 35.3(L) 36.3  Platelets 150 - 400 K/uL 383 335 254     CMP Latest Ref Rng & Units 10/20/2020 09/29/2020 09/12/2020  Glucose 70 - 99 mg/dL 171(H) 130(H) 78  BUN 6 - 20 mg/dL _0 Creatinine 0.44 - 1.00 mg/dL 0.75 0.68 0.60  Sodium 135 - 145 mmol/L 139 141  137  Potassium 3.5 - 5.1 mmol/L 4.3 4.4 4.0  Chloride 98 - 111 mmol/L 107 108 108  CO2 22 - 32 mmol/L 23 24 21(L)  Calcium 8.9 - 10.3 mg/dL 9.3 9.3 8.2(L)  Total Protein 6.5 - 8.1 g/dL 6.4(L) 6.7 5.7(L)  Total Bilirubin 0.3 - 1.2 mg/dL 0.2(L) 0.3 0.8  Alkaline Phos 38 - 126 U/L 91 102 68  AST 15 - 41 U/L 16 33 15  ALT 0 - 44 U/L 15 58(H) 17      RADIOGRAPHIC STUDIES: I have personally reviewed the  radiological images as listed and agreed with the findings in the report. No results found.   ASSESSMENT & PLAN:  NORETA KUE is a 55 y.o. female with   1. Malignant neoplasm of upper-outer quadrant of left breast, Stage IIA, pT2N1M0, stage IIA, ER+/PR+/HER2-, Grade III, Mammaprint high risk -She underwent Left breast lumpectomy with left axillary sentinel lymph node biopsies on 06/03/20 under Dr. Barry Dienes. Pathology showed: 2.4 cm invasive ductal carcinoma, grade 3; DCIS; one positive lymph node (1/2); positive inferior margin. Her Mammaprint showed high risk luminal type.  -She underwent re-excision surgery on 07/27/20 with Dr Barry Dienes. Path showed no residual cancer. -Her CT CAP and bone scan from 07/27/20 were negative for distant mets. -To reduce her risk of cancer recurrence, she started adjuvant chemo with docetaxel and Cytoxan (TC) Q3weeks for 4 cycles starting 08/18/20.  -Tolerated cycle 1 poorly with severe stomach pain on day 5 requiring hospitalization. She tolerated cycles 2-4 better with dose reduction and scheduled IVF on day 3, 5 and 6. - added IV Ativan from C2 due to severe headache from [place].  -She completed all 4 cycles of TC. She is nauseated and fatigued today. She will return for IVF tomorrow. I reviewed antiemetics with her again today  -She will be contacted soon to schedule follow up with radiation oncology.  She plans to receive treatment when she returns from [place]. -She would like to have her port removed before she leaves for her trip. -I will see her in her final  week of radiation therapy to discuss antiestrogens.   2. Symptom management: Stomach pain, Dehydration -secondary to TC. Exacerbated by eating and drinking. -Protonix alone has not helped. She began sucralfate on 09/16/20, bland diet was recommended  -She can continue Tylenol #3. -I changed her HTN medicine to Lisinopril without HCTZ on 09/12/20. She has some minimal ankle swelling. -She received IVF on days 3, 5, and 6 of each cycle to rehydrate. -No neuropathy symptoms s/p C4   3. Hot flashes -She was seen to be post-menopausal on 12/2017 labs with [facility] -She started having hot flashes in Fall 2021. Her gyn started on her Estradial patch in 04/2020 which helps very well. Due to ER/PR positive disease, she stopped in March.  -She experienced vomiting with Effexor. -Due to worsening hot flashes, she was started on gabapentin on 10/20/20   4. Social Support, Anxiety  -She is married with 2 adult daughters. Her family is aware of her breast cancer diagnosis. -She is working on coping better with diagnosis and chemo.    5. She declined Genetic Testing (08/10/20)     PLAN:  -I will reach out to Dr. Barry Dienes for port removal in 3 weeks. Patient would like this to be done before she leaves on 11/24/20. -rad onc will contact her to make f/u appointment. Proceed with adjuvant radiation next. -labs and f/u the last week of radiation therapy, plan to start antiestrogen therapy after next visit     No problem-specific Assessment & Plan notes found for this encounter.   No orders of the defined types were placed in this encounter.  All questions were answered. The patient knows to call the clinic with any problems, questions or concerns. No barriers to learning was detected. The total time spent in the appointment was 20 minutes.     Truitt Merle, MD 10/24/2020   I, Wilburn Mylar, am acting as scribe for Truitt Merle, MD.   I have reviewed the above documentation  for accuracy and  completeness, and I agree with the above.

## 2020-10-24 NOTE — Progress Notes (Signed)
Last Chemo cycle per MD. Madaline Brilliant to hold [place].  Raul Del [place], [place], BCPS, BCOP 10/24/2020 5:08 PM

## 2020-10-24 NOTE — Telephone Encounter (Signed)
Called patient to schedule appointment with Shona Simpson, PA. No answer, LVM for return call.

## 2020-10-24 NOTE — Addendum Note (Signed)
Addended by: Neysa Hotter on: 10/24/2020 05:06 PM   Modules accepted: Orders

## 2020-10-25 ENCOUNTER — Telehealth: Payer: Self-pay | Admitting: Radiation Oncology

## 2020-10-25 ENCOUNTER — Inpatient Hospital Stay: Payer: BC Managed Care – PPO

## 2020-10-25 NOTE — Telephone Encounter (Signed)
Second attempt: Called patient to schedule appointment with Shona Simpson, PA. No answer, LVM for return call.

## 2020-10-26 ENCOUNTER — Encounter: Payer: Self-pay | Admitting: *Deleted

## 2020-11-11 ENCOUNTER — Telehealth: Payer: Self-pay

## 2020-11-11 NOTE — Telephone Encounter (Signed)
This patient called and stated that ever since she completed her chemo she has intermittent swelling in her ankles.  It happens more in the right ankle then the left ankle.   Patient denies injury and pain.  Patient states that she is a little concerned and would like to know what she should do.  This nurse advised patient to elevate her feet as mush as possible.  MD will be made aware.

## 2020-11-17 ENCOUNTER — Telehealth: Payer: Self-pay

## 2020-11-17 NOTE — Telephone Encounter (Signed)
This nurse returned called to patient related to swelling in her ankles.  Request a call back to verify that there is no pain with swelling.  Also want to encourage patient to get some compression socks for her swelling. This nurse left a message for patient to return call to clinic.

## 2020-11-17 NOTE — Telephone Encounter (Signed)
This nurse received call from patient who verified that she is not having pain.  The swelling is just in her right ankle down to her foot.  Patient denies having any compression socks.  This nurse encourage patient to get some.  Patient then stated that she is leaving to go out of the country next week and would like to know if she can get a diuretic to help decrease the swelling in her foot and ankle.  This nurse informed that she will send request to the MD.  No further questions or concerns at this time.

## 2020-11-17 NOTE — Telephone Encounter (Signed)
This patient called and stated that ever since she completed her chemo she has intermittent swelling in her ankles.  It happens more in the right ankle then the left ankle.   Patient denies injury and pain.  Patient states that she is a little concerned and would like to know what she should do.  This nurse advised patient to elevate her feet as mush as possible.  MD will be made aware.

## 2020-11-18 ENCOUNTER — Other Ambulatory Visit: Payer: Self-pay | Admitting: Hematology

## 2020-11-18 MED ORDER — POTASSIUM CHLORIDE CRYS ER 20 MEQ PO TBCR: 20.0000 meq | EXTENDED_RELEASE_TABLET | Freq: Every day | ORAL | 0 refills | Status: DC

## 2020-11-18 MED ORDER — FUROSEMIDE 20 MG PO TABS: 20.0000 mg | ORAL_TABLET | Freq: Every day | ORAL | 0 refills | Status: DC

## 2020-12-06 ENCOUNTER — Encounter: Payer: Self-pay | Admitting: Radiation Oncology

## 2020-12-06 ENCOUNTER — Other Ambulatory Visit: Payer: Self-pay

## 2020-12-06 ENCOUNTER — Ambulatory Visit
Admission: RE | Admit: 2020-12-06 | Discharge: 2020-12-06 | Disposition: A | Discharge status: Home / Self Care | Payer: BC Managed Care – PPO | Source: Ambulatory Visit | Attending: Radiation Oncology | Admitting: Radiation Oncology

## 2020-12-06 VITALS — BP 127/77 | HR 72 | Temp 97.5°F | Resp 18 | Ht 60.0 in | Wt 165.1 lb

## 2020-12-06 DIAGNOSIS — I10 Essential (primary) hypertension: Secondary | ICD-10-CM | POA: Insufficient documentation

## 2020-12-06 DIAGNOSIS — E0590 Thyrotoxicosis, unspecified without thyrotoxic crisis or storm: Secondary | ICD-10-CM | POA: Diagnosis not present

## 2020-12-06 DIAGNOSIS — C50412 Malignant neoplasm of upper-outer quadrant of left female breast: Secondary | ICD-10-CM | POA: Diagnosis not present

## 2020-12-06 DIAGNOSIS — E860 Dehydration: Secondary | ICD-10-CM | POA: Diagnosis not present

## 2020-12-06 DIAGNOSIS — Z79899 Other long term (current) drug therapy: Secondary | ICD-10-CM | POA: Diagnosis not present

## 2020-12-06 DIAGNOSIS — Z803 Family history of malignant neoplasm of breast: Secondary | ICD-10-CM | POA: Diagnosis not present

## 2020-12-06 DIAGNOSIS — Z510 Encounter for antineoplastic radiation therapy: Secondary | ICD-10-CM | POA: Diagnosis not present

## 2020-12-06 DIAGNOSIS — C159 Malignant neoplasm of esophagus, unspecified: Secondary | ICD-10-CM

## 2020-12-06 DIAGNOSIS — Z170 Estrogen receptor positive status [ER+]: Secondary | ICD-10-CM | POA: Diagnosis not present

## 2020-12-06 DIAGNOSIS — Z801 Family history of malignant neoplasm of trachea, bronchus and lung: Secondary | ICD-10-CM | POA: Diagnosis not present

## 2020-12-06 NOTE — Progress Notes (Signed)
Radiation Oncology         [phone] ________________________________  Follow Up   Name: Sherry Carrillo        MRN: [id number]  Date of Service: 12/06/2020 DOB: April 24, 1965  MC:NOBSJG, [name], MD  Truitt Merle, MD     REFERRING PHYSICIAN: Truitt Merle, MD   DIAGNOSIS: The encounter diagnosis was Malignant neoplasm of upper-outer quadrant of left breast in female, estrogen receptor positive ([place]).   HISTORY OF PRESENT ILLNESS: Sherry Carrillo is a 55 y.o. female seen at the request of Dr. Barry Dienes for a new diagnosis of left breast cancer.  The patient had a palpable lump in the left breast on recent clinical examination and underwent diagnostic bilateral mammogram that revealed a 2 to 2.5 cm mass in the upper outer quadrant.  Targeted ultrasound identified a 2.3 cm mass in the 1 o'clock position without adenopathy. 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    Since her last visit she has undergone left lumpectomy with sentinel lymph node biopsy on 06/03/2020 that showed a grade 3 invasive ductal carcinoma measuring 2.4 cm with associated high-grade DCIS.  Invasive disease was at the inferior margin focally and less than 1 mm from the anterior margin focally, in situ disease was less than 1 mm from the anterior margin focally and 1 of 2 sampled lymph nodes was positive for metastatic disease.  Her tumor testing for MammaPrint was also high risk.  She proceeded with reexcision of her margins on 07/27/2020 of the anterior and inferior margins both of which were negative for disease.  She did receive systemic chemotherapy between 08/19/2019 and completed this on 10/20/2020. She is seen today to discuss adjuvant radiotherapy to the left breast and regional lymph nodes.   PREVIOUS RADIATION THERAPY: No   PAST MEDICAL HISTORY:  Past Medical History:  Diagnosis Date   Abnormal pap 2009   PT HAD SURGERY BUT DON'T KNOW WHAT TYPE   Breast  cancer ([place]) 04/27/2020   Headache(784.0)    otc meds prn   Hypertension    Hyperthyroidism    SVD (spontaneous vaginal delivery)    x 2       PAST SURGICAL HISTORY: Past Surgical History:  Procedure Laterality Date   BREAST LUMPECTOMY WITH RADIOACTIVE SEED AND SENTINEL LYMPH NODE BIOPSY Left 06/03/2020   Procedure: RADIOACTIVE SEED GUIDED LEFT BREAST LUMPECTOMY, LEFT AXILLARY SENTINEL LYMPH NODE BIOPSY;  Surgeon: [name], MD;  Location: [facility];  Service: General;  Laterality: Left;   FOOT SURGERY     left   PORTACATH PLACEMENT Left 07/27/2020   Procedure: INSERTION PORT-A-CATH;  Surgeon: [name], MD;  Location: [facility];  Service: General;  Laterality: Left;   RE-EXCISION OF BREAST LUMPECTOMY Left 07/27/2020   Procedure: RE-EXCISION OF LEFT BREAST LUMPECTOMY;  Surgeon: [name], MD;  Location: [facility];  Service: General;  Laterality: Left;   TUBAL LIGATION     WISDOM TOOTH EXTRACTION       FAMILY HISTORY:  Family History  Problem Relation Age of Onset   Cancer Mother        LUNG   Hypertension Mother    Diabetes Mother    Heart disease Father    Diabetes Maternal Grandmother    Heart disease Paternal Grandmother    Heart disease Paternal Grandfather    Thyroid disease Sister    Diabetes Brother    Cancer Maternal Aunt  65       breast cancer      SOCIAL HISTORY:  reports that she has never smoked. She has never used smokeless tobacco. She reports that she does not drink alcohol and does not use drugs. The patient is married and lives in [place]. 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   ALLERGIES: Sulfa antibiotics   MEDICATIONS:  Current Outpatient Medications  Medication Sig Dispense Refill   acetaminophen-codeine (TYLENOL #3) 300-30 MG tablet Take 1 tablet by mouth every 8 (eight) hours as needed for moderate pain. 30 tablet 0   dexamethasone (DECADRON) 4 MG tablet Take 1 tablet (4 mg total) by mouth 2 (two) times daily. Start the day  before Taxotere. Then again 1 tab daily the day after chemo for 3 days. 30 tablet 1   furosemide (LASIX) 20 MG tablet Take 1 tablet (20 mg total) by mouth daily. 14 tablet 0   gabapentin (NEURONTIN) 100 MG capsule Take 1 capsule (100 mg total) by mouth 3 (three) times daily. 30 capsule 0   lisinopril (ZESTRIL) 20 MG tablet Take 1 tablet (20 mg total) by mouth daily. 30 tablet 0   ondansetron (ZOFRAN) 8 MG tablet Take 1 tablet (8 mg total) by mouth 2 (two) times daily as needed for refractory nausea / vomiting. Start on day 3 after chemo. 30 tablet 1   pantoprazole (PROTONIX) 40 MG tablet Take 1 tablet (40 mg total) by mouth daily. 30 tablet 0   potassium chloride SA (KLOR-CON) 20 MEQ tablet Take 1 tablet (20 mEq total) by mouth daily. 14 tablet 0   prochlorperazine (COMPAZINE) 10 MG tablet Take 1 tablet (10 mg total) by mouth every 6 (six) hours as needed (Nausea or vomiting). 30 tablet 1   sucralfate (CARAFATE) 1 g tablet Take 1 tablet (1 g total) by mouth 4 (four) times daily -  with meals and at bedtime. 60 tablet 2   No current facility-administered medications for this encounter.   Facility-Administered Medications Ordered in Other Encounters  Medication Dose Route Frequency Provider Last Rate Last Admin   sodium chloride flush (NS) 0.9 % injection 10 mL  10 mL Intracatheter PRN Truitt Merle, MD   10 mL at 09/08/20 1437     REVIEW OF SYSTEMS: On review of systems, the patient reports that she is doing much better than she had been early on in her chemotherapy regimen. She was having syncopal episodes as a result of diuretics and dehydration. She feels like she's doing much better and happy that some of her hair is growing back in. She reports some numbness along the back of her upper arm that has improved over time but still noticeable. She also reports soreness around her PAC site in the left chest with certain movements. No other complaints are verbalized.      PHYSICAL EXAM:  Wt Readings  from Last 3 Encounters:  10/24/20 166 lb 14.4 oz (75.7 kg)  10/20/20 169 lb 3.2 oz (76.7 kg)  10/03/20 167 lb 3.2 oz (75.8 kg)   Temp Readings from Last 3 Encounters:  10/24/20 98.5 F (36.9 C) (Oral)  10/22/20 98.1 F (36.7 C) (Oral)  10/20/20 98 F (36.7 C) (Oral)   BP Readings from Last 3 Encounters:  10/24/20 133/88  10/22/20 136/75  10/20/20 (!) 147/82   Pulse Readings from Last 3 Encounters:  10/24/20 (!) 102  10/22/20 85  10/20/20 99   In general this is a well appearing Native American  female in no acute distress. She's alert and oriented x4 and appropriate throughout the examination. Cardiopulmonary assessment is negative for acute distress and she exhibits normal effort. The left breast/axilla was evaluated and revealed a single well healed incision site. Her port a cath site is also noted in the left chest and neither site had erythema, separation or drainage.  ECOG = 0  0 - Asymptomatic (Fully active, able to carry on all predisease activities without restriction)  1 - Symptomatic but completely ambulatory (Restricted in physically strenuous activity but ambulatory and able to carry out work of a light or sedentary nature. For example, light housework, office work)  2 - Symptomatic, <50% in bed during the day (Ambulatory and capable of all self care but unable to carry out any work activities. Up and about more than 50% of waking hours)  3 - Symptomatic, >50% in bed, but not bedbound (Capable of only limited self-care, confined to bed or chair 50% or more of waking hours)  4 - Bedbound (Completely disabled. Cannot carry on any self-care. Totally confined to bed or chair)  5 - Death   Eustace Pen MM, Creech RH, Tormey DC, et al. [phone]). "Toxicity and response criteria of the [facility]". [place] Oncol. 5 (6): 649-55    LABORATORY DATA:  Lab Results  Component Value Date   WBC 9.5 10/20/2020   HGB 10.7 (L) 10/20/2020   HCT 33.0 (L)  10/20/2020   MCV 87.1 10/20/2020   PLT 383 10/20/2020   Lab Results  Component Value Date   NA 139 10/20/2020   K 4.3 10/20/2020   CL 107 10/20/2020   CO2 23 10/20/2020   Lab Results  Component Value Date   ALT 15 10/20/2020   AST 16 10/20/2020   ALKPHOS 91 10/20/2020   BILITOT 0.2 (L) 10/20/2020      RADIOGRAPHY: No results found.      IMPRESSION/PLAN: 1. Stage IIA, pT2N1M0 grade 3, ER/PR positive invasive ductal carcinoma of the left breast. Dr. Lisbeth Renshaw reviews the pathology findings and reviews the nature of left breast disease. She has done well since surgery and since completing chemotherapy. Dr. Lisbeth Renshaw discusses the rationale to now pursue external radiotherapy to the breast and regional nodes to reduce risks of local recurrence followed by antiestrogen therapy. We discussed the risks, benefits, short, and long term effects of radiotherapy, as well as the curative intent, and the patient is interested in proceeding. Dr. Lisbeth Renshaw discusses the delivery and logistics of radiotherapy and anticipates a course of  6 1/2 weeks of radiotherapy to the left breast and regional nodes, with deep inspiration breath hold. Written consent is obtained and placed in the chart, a copy was provided to the patient. She will simulate today.    In a visit lasting 45 minutes, greater than 50% of the time was spent face to face discussing the patient's condition, in preparation for the discussion, and coordinating the patient's care.    The above documentation reflects my direct findings during this shared patient visit. Please see the separate note by Dr. Lisbeth Renshaw on this date for the remainder of the patient's plan of care.    Carola Rhine, [facility]    **Disclaimer: This note was dictated with voice recognition software. Similar sounding words can inadvertently be transcribed and this note may contain transcription errors which may not have been corrected upon publication of note.**

## 2020-12-06 NOTE — Progress Notes (Signed)
Patient reports pain 3/10 bilateral legs. No other symptoms to report at this time.  Meaningful use complete.  BP 127/77 (BP Location: Right Arm, Patient Position: Sitting, Cuff Size: Normal)   Pulse 72   Temp (!) 97.5 F (36.4 C) (Temporal)   Resp 18   Ht 5' (1.524 m)   Wt 165 lb 2 oz (74.9 kg)   LMP 11/12/2011 Comment: pt denies preg  SpO2 100%   BMI 32.25 kg/m

## 2020-12-12 ENCOUNTER — Encounter: Payer: Self-pay | Admitting: *Deleted

## 2020-12-13 ENCOUNTER — Telehealth: Payer: Self-pay | Admitting: Hematology

## 2020-12-13 ENCOUNTER — Other Ambulatory Visit: Payer: Self-pay

## 2020-12-13 ENCOUNTER — Ambulatory Visit
Admission: RE | Admit: 2020-12-13 | Discharge: 2020-12-13 | Disposition: A | Discharge status: Home / Self Care | Payer: BC Managed Care – PPO | Source: Ambulatory Visit | Attending: Radiation Oncology | Admitting: Radiation Oncology

## 2020-12-13 DIAGNOSIS — C50412 Malignant neoplasm of upper-outer quadrant of left female breast: Secondary | ICD-10-CM | POA: Diagnosis not present

## 2020-12-13 NOTE — Telephone Encounter (Signed)
Scheduled per sch msg. Called and left msg  

## 2020-12-14 ENCOUNTER — Ambulatory Visit
Admission: RE | Admit: 2020-12-14 | Discharge: 2020-12-14 | Disposition: A | Discharge status: Home / Self Care | Payer: BC Managed Care – PPO | Source: Ambulatory Visit | Attending: Radiation Oncology | Admitting: Radiation Oncology

## 2020-12-14 DIAGNOSIS — C50412 Malignant neoplasm of upper-outer quadrant of left female breast: Secondary | ICD-10-CM | POA: Diagnosis not present

## 2020-12-15 ENCOUNTER — Other Ambulatory Visit: Payer: Self-pay | Admitting: General Surgery

## 2020-12-15 ENCOUNTER — Other Ambulatory Visit: Payer: Self-pay

## 2020-12-15 ENCOUNTER — Ambulatory Visit
Admission: RE | Admit: 2020-12-15 | Discharge: 2020-12-15 | Disposition: A | Discharge status: Home / Self Care | Payer: BC Managed Care – PPO | Source: Ambulatory Visit | Attending: Radiation Oncology | Admitting: Radiation Oncology

## 2020-12-15 DIAGNOSIS — C50412 Malignant neoplasm of upper-outer quadrant of left female breast: Secondary | ICD-10-CM | POA: Diagnosis not present

## 2020-12-15 NOTE — Progress Notes (Signed)
Pt here for patient teaching.  Pt given Radiation and You booklet, skin care instructions, Alra deodorant, and Radiaplex gel.  Reviewed areas of pertinence such as fatigue, hair loss, skin changes, breast tenderness, and breast swelling . Pt able to give teach back of to pat skin and use unscented/gentle soap,apply Radiaplex bid, avoid applying anything to skin within 4 hours of treatment, avoid wearing an under wire bra, and to use an electric razor if they must shave. Pt verbalizes understanding of information given and will contact nursing with any questions or concerns.     Http://rtanswers.org/treatmentinformation/whattoexpect/index  Chioke Noxon M. Jennye Runquist RN, BSN       

## 2020-12-16 ENCOUNTER — Other Ambulatory Visit: Payer: Self-pay

## 2020-12-16 ENCOUNTER — Ambulatory Visit
Admission: RE | Admit: 2020-12-16 | Discharge: 2020-12-16 | Disposition: A | Discharge status: Home / Self Care | Payer: BC Managed Care – PPO | Source: Ambulatory Visit | Attending: Radiation Oncology | Admitting: Radiation Oncology

## 2020-12-16 DIAGNOSIS — C50412 Malignant neoplasm of upper-outer quadrant of left female breast: Secondary | ICD-10-CM

## 2020-12-16 DIAGNOSIS — Z170 Estrogen receptor positive status [ER+]: Secondary | ICD-10-CM

## 2020-12-16 MED ORDER — ALRA NON-METALLIC DEODORANT (RAD-ONC)
1.0000 "application " | Freq: Once | TOPICAL | Status: AC
  Administered 2020-12-16: 1 via TOPICAL

## 2020-12-16 MED ORDER — RADIAPLEXRX EX GEL: Freq: Once | CUTANEOUS | Status: AC

## 2020-12-19 ENCOUNTER — Ambulatory Visit
Admission: RE | Admit: 2020-12-19 | Discharge: 2020-12-19 | Disposition: A | Discharge status: Home / Self Care | Payer: BC Managed Care – PPO | Source: Ambulatory Visit | Attending: Radiation Oncology | Admitting: Radiation Oncology

## 2020-12-19 ENCOUNTER — Other Ambulatory Visit: Payer: Self-pay

## 2020-12-19 DIAGNOSIS — C50412 Malignant neoplasm of upper-outer quadrant of left female breast: Secondary | ICD-10-CM | POA: Diagnosis not present

## 2020-12-20 ENCOUNTER — Other Ambulatory Visit: Payer: Self-pay

## 2020-12-20 ENCOUNTER — Ambulatory Visit
Admission: RE | Admit: 2020-12-20 | Discharge: 2020-12-20 | Disposition: A | Discharge status: Home / Self Care | Payer: BC Managed Care – PPO | Source: Ambulatory Visit | Attending: Radiation Oncology | Admitting: Radiation Oncology

## 2020-12-20 DIAGNOSIS — C50412 Malignant neoplasm of upper-outer quadrant of left female breast: Secondary | ICD-10-CM | POA: Diagnosis not present

## 2020-12-21 ENCOUNTER — Ambulatory Visit
Admission: RE | Admit: 2020-12-21 | Discharge: 2020-12-21 | Disposition: A | Discharge status: Home / Self Care | Payer: BC Managed Care – PPO | Source: Ambulatory Visit | Attending: Radiation Oncology | Admitting: Radiation Oncology

## 2020-12-21 DIAGNOSIS — C50412 Malignant neoplasm of upper-outer quadrant of left female breast: Secondary | ICD-10-CM | POA: Diagnosis not present

## 2020-12-22 ENCOUNTER — Ambulatory Visit
Admission: RE | Admit: 2020-12-22 | Discharge: 2020-12-22 | Disposition: A | Discharge status: Home / Self Care | Payer: BC Managed Care – PPO | Source: Ambulatory Visit | Attending: Radiation Oncology | Admitting: Radiation Oncology

## 2020-12-22 ENCOUNTER — Other Ambulatory Visit: Payer: Self-pay

## 2020-12-22 DIAGNOSIS — C50412 Malignant neoplasm of upper-outer quadrant of left female breast: Secondary | ICD-10-CM | POA: Diagnosis not present

## 2020-12-23 ENCOUNTER — Ambulatory Visit
Admission: RE | Admit: 2020-12-23 | Discharge: 2020-12-23 | Disposition: A | Discharge status: Home / Self Care | Payer: BC Managed Care – PPO | Source: Ambulatory Visit | Attending: Radiation Oncology | Admitting: Radiation Oncology

## 2020-12-23 DIAGNOSIS — C50412 Malignant neoplasm of upper-outer quadrant of left female breast: Secondary | ICD-10-CM | POA: Diagnosis not present

## 2020-12-26 ENCOUNTER — Ambulatory Visit: Payer: BC Managed Care – PPO

## 2020-12-27 ENCOUNTER — Other Ambulatory Visit: Payer: Self-pay

## 2020-12-27 ENCOUNTER — Ambulatory Visit
Admission: RE | Admit: 2020-12-27 | Discharge: 2020-12-27 | Disposition: A | Discharge status: Home / Self Care | Payer: BC Managed Care – PPO | Source: Ambulatory Visit | Attending: Radiation Oncology | Admitting: Radiation Oncology

## 2020-12-27 DIAGNOSIS — Z510 Encounter for antineoplastic radiation therapy: Secondary | ICD-10-CM | POA: Diagnosis not present

## 2020-12-27 DIAGNOSIS — Z170 Estrogen receptor positive status [ER+]: Secondary | ICD-10-CM | POA: Diagnosis not present

## 2020-12-27 DIAGNOSIS — C50412 Malignant neoplasm of upper-outer quadrant of left female breast: Secondary | ICD-10-CM | POA: Diagnosis not present

## 2020-12-28 ENCOUNTER — Ambulatory Visit
Admission: RE | Admit: 2020-12-28 | Discharge: 2020-12-28 | Disposition: A | Discharge status: Home / Self Care | Payer: BC Managed Care – PPO | Source: Ambulatory Visit | Attending: Radiation Oncology | Admitting: Radiation Oncology

## 2020-12-28 DIAGNOSIS — C50412 Malignant neoplasm of upper-outer quadrant of left female breast: Secondary | ICD-10-CM | POA: Diagnosis not present

## 2020-12-29 ENCOUNTER — Other Ambulatory Visit: Payer: Self-pay

## 2020-12-29 ENCOUNTER — Ambulatory Visit
Admission: RE | Admit: 2020-12-29 | Discharge: 2020-12-29 | Disposition: A | Discharge status: Home / Self Care | Payer: BC Managed Care – PPO | Source: Ambulatory Visit | Attending: Radiation Oncology | Admitting: Radiation Oncology

## 2020-12-29 DIAGNOSIS — C50412 Malignant neoplasm of upper-outer quadrant of left female breast: Secondary | ICD-10-CM | POA: Diagnosis not present

## 2020-12-30 ENCOUNTER — Other Ambulatory Visit: Payer: Self-pay

## 2020-12-30 ENCOUNTER — Ambulatory Visit
Admission: RE | Admit: 2020-12-30 | Discharge: 2020-12-30 | Disposition: A | Discharge status: Home / Self Care | Payer: BC Managed Care – PPO | Source: Ambulatory Visit | Attending: Radiation Oncology | Admitting: Radiation Oncology

## 2020-12-30 DIAGNOSIS — C50412 Malignant neoplasm of upper-outer quadrant of left female breast: Secondary | ICD-10-CM | POA: Diagnosis not present

## 2021-01-02 ENCOUNTER — Ambulatory Visit
Admission: RE | Admit: 2021-01-02 | Discharge: 2021-01-02 | Disposition: A | Discharge status: Home / Self Care | Payer: BC Managed Care – PPO | Source: Ambulatory Visit | Attending: Radiation Oncology | Admitting: Radiation Oncology

## 2021-01-02 ENCOUNTER — Other Ambulatory Visit: Payer: Self-pay

## 2021-01-02 DIAGNOSIS — C50412 Malignant neoplasm of upper-outer quadrant of left female breast: Secondary | ICD-10-CM | POA: Diagnosis not present

## 2021-01-03 ENCOUNTER — Ambulatory Visit
Admission: RE | Admit: 2021-01-03 | Discharge: 2021-01-03 | Disposition: A | Discharge status: Home / Self Care | Payer: BC Managed Care – PPO | Source: Ambulatory Visit | Attending: Radiation Oncology | Admitting: Radiation Oncology

## 2021-01-03 ENCOUNTER — Encounter: Payer: Self-pay | Admitting: Hematology

## 2021-01-03 DIAGNOSIS — C50412 Malignant neoplasm of upper-outer quadrant of left female breast: Secondary | ICD-10-CM | POA: Diagnosis not present

## 2021-01-04 ENCOUNTER — Other Ambulatory Visit: Payer: Self-pay

## 2021-01-04 ENCOUNTER — Ambulatory Visit
Admission: RE | Admit: 2021-01-04 | Discharge: 2021-01-04 | Disposition: A | Discharge status: Home / Self Care | Payer: BC Managed Care – PPO | Source: Ambulatory Visit | Attending: Radiation Oncology | Admitting: Radiation Oncology

## 2021-01-04 DIAGNOSIS — C50412 Malignant neoplasm of upper-outer quadrant of left female breast: Secondary | ICD-10-CM | POA: Diagnosis not present

## 2021-01-05 ENCOUNTER — Ambulatory Visit
Admission: RE | Admit: 2021-01-05 | Discharge: 2021-01-05 | Disposition: A | Discharge status: Home / Self Care | Payer: BC Managed Care – PPO | Source: Ambulatory Visit | Attending: Radiation Oncology | Admitting: Radiation Oncology

## 2021-01-05 DIAGNOSIS — C50412 Malignant neoplasm of upper-outer quadrant of left female breast: Secondary | ICD-10-CM | POA: Diagnosis not present

## 2021-01-06 ENCOUNTER — Ambulatory Visit
Admission: RE | Admit: 2021-01-06 | Discharge: 2021-01-06 | Disposition: A | Discharge status: Home / Self Care | Payer: BC Managed Care – PPO | Source: Ambulatory Visit | Attending: Radiation Oncology | Admitting: Radiation Oncology

## 2021-01-06 DIAGNOSIS — C50412 Malignant neoplasm of upper-outer quadrant of left female breast: Secondary | ICD-10-CM

## 2021-01-06 MED ORDER — RADIAPLEXRX EX GEL: Freq: Once | CUTANEOUS | Status: AC

## 2021-01-09 ENCOUNTER — Ambulatory Visit
Admission: RE | Admit: 2021-01-09 | Discharge: 2021-01-09 | Disposition: A | Discharge status: Home / Self Care | Payer: BC Managed Care – PPO | Source: Ambulatory Visit | Attending: Radiation Oncology | Admitting: Radiation Oncology

## 2021-01-09 ENCOUNTER — Other Ambulatory Visit: Payer: Self-pay

## 2021-01-09 DIAGNOSIS — C50412 Malignant neoplasm of upper-outer quadrant of left female breast: Secondary | ICD-10-CM | POA: Diagnosis not present

## 2021-01-10 ENCOUNTER — Ambulatory Visit
Admission: RE | Admit: 2021-01-10 | Discharge: 2021-01-10 | Disposition: A | Discharge status: Home / Self Care | Payer: BC Managed Care – PPO | Source: Ambulatory Visit | Attending: Radiation Oncology | Admitting: Radiation Oncology

## 2021-01-10 DIAGNOSIS — C50412 Malignant neoplasm of upper-outer quadrant of left female breast: Secondary | ICD-10-CM | POA: Diagnosis not present

## 2021-01-11 ENCOUNTER — Ambulatory Visit
Admission: RE | Admit: 2021-01-11 | Discharge: 2021-01-11 | Disposition: A | Discharge status: Home / Self Care | Payer: BC Managed Care – PPO | Source: Ambulatory Visit | Attending: Radiation Oncology | Admitting: Radiation Oncology

## 2021-01-11 ENCOUNTER — Other Ambulatory Visit: Payer: Self-pay

## 2021-01-11 ENCOUNTER — Other Ambulatory Visit: Payer: Self-pay | Admitting: Family Medicine

## 2021-01-11 DIAGNOSIS — C50412 Malignant neoplasm of upper-outer quadrant of left female breast: Secondary | ICD-10-CM | POA: Diagnosis not present

## 2021-01-11 DIAGNOSIS — I10 Essential (primary) hypertension: Secondary | ICD-10-CM

## 2021-01-12 ENCOUNTER — Other Ambulatory Visit: Payer: Self-pay

## 2021-01-12 ENCOUNTER — Ambulatory Visit
Admission: RE | Admit: 2021-01-12 | Discharge: 2021-01-12 | Disposition: A | Discharge status: Home / Self Care | Payer: BC Managed Care – PPO | Source: Ambulatory Visit | Attending: Radiation Oncology | Admitting: Radiation Oncology

## 2021-01-12 DIAGNOSIS — C50412 Malignant neoplasm of upper-outer quadrant of left female breast: Secondary | ICD-10-CM | POA: Diagnosis not present

## 2021-01-13 ENCOUNTER — Ambulatory Visit
Admission: RE | Admit: 2021-01-13 | Discharge: 2021-01-13 | Disposition: A | Discharge status: Home / Self Care | Payer: BC Managed Care – PPO | Source: Ambulatory Visit | Attending: Radiation Oncology | Admitting: Radiation Oncology

## 2021-01-13 ENCOUNTER — Other Ambulatory Visit: Payer: Self-pay

## 2021-01-13 ENCOUNTER — Ambulatory Visit: Payer: BC Managed Care – PPO | Admitting: Radiation Oncology

## 2021-01-13 DIAGNOSIS — C50412 Malignant neoplasm of upper-outer quadrant of left female breast: Secondary | ICD-10-CM | POA: Diagnosis not present

## 2021-01-13 MED ORDER — LISINOPRIL 20 MG PO TABS: 20.0000 mg | ORAL_TABLET | Freq: Every day | ORAL | 0 refills | Status: DC

## 2021-01-13 NOTE — Telephone Encounter (Signed)
Last refilled by Dr Burr Medico

## 2021-01-13 NOTE — Telephone Encounter (Signed)
MEDICATION: lisinopril (ZESTRIL) 20 MG tablet  PHARMACY: [facility], [place] Phone:  [phone]  Fax:  [phone]      Comments: Patient is scheduled for 01/16/21 for a Virtual visit.   **Let patient know to contact pharmacy at the end of the day to make sure medication is ready. **  ** Please notify patient to allow 48-72 hours to process**  **Encourage patient to contact the pharmacy for refills or they can request refills through [facility]**

## 2021-01-16 ENCOUNTER — Ambulatory Visit
Admission: RE | Admit: 2021-01-16 | Discharge: 2021-01-16 | Disposition: A | Discharge status: Home / Self Care | Payer: BC Managed Care – PPO | Source: Ambulatory Visit | Attending: Radiation Oncology | Admitting: Radiation Oncology

## 2021-01-16 ENCOUNTER — Other Ambulatory Visit: Payer: Self-pay

## 2021-01-16 ENCOUNTER — Telehealth (INDEPENDENT_AMBULATORY_CARE_PROVIDER_SITE_OTHER): Payer: BC Managed Care – PPO | Admitting: Family Medicine

## 2021-01-16 ENCOUNTER — Encounter: Payer: Self-pay | Admitting: Family Medicine

## 2021-01-16 VITALS — Ht 60.0 in | Wt 160.0 lb

## 2021-01-16 DIAGNOSIS — I10 Essential (primary) hypertension: Secondary | ICD-10-CM

## 2021-01-16 DIAGNOSIS — F4320 Adjustment disorder, unspecified: Secondary | ICD-10-CM | POA: Insufficient documentation

## 2021-01-16 DIAGNOSIS — R600 Localized edema: Secondary | ICD-10-CM | POA: Diagnosis not present

## 2021-01-16 DIAGNOSIS — Z780 Asymptomatic menopausal state: Secondary | ICD-10-CM

## 2021-01-16 DIAGNOSIS — F4329 Adjustment disorder with other symptoms: Secondary | ICD-10-CM

## 2021-01-16 DIAGNOSIS — Z170 Estrogen receptor positive status [ER+]: Secondary | ICD-10-CM

## 2021-01-16 DIAGNOSIS — C50412 Malignant neoplasm of upper-outer quadrant of left female breast: Secondary | ICD-10-CM | POA: Diagnosis not present

## 2021-01-16 MED ORDER — LISINOPRIL-HYDROCHLOROTHIAZIDE 20-12.5 MG PO TABS: 1.0000 | ORAL_TABLET | Freq: Every day | ORAL | 3 refills | Status: DC

## 2021-01-16 NOTE — Assessment & Plan Note (Signed)
Did not find Celexa effective.  Cannot take estrogen due to history of breast cancer.  She has been prescribed gabapentin as well but did not like the side effects.  She will stay off medications for now although advised patient she could try taking a nighttime dose of gabapentin in the future to see if this helps.

## 2021-01-16 NOTE — Assessment & Plan Note (Signed)
Will refill lisinopril-HCTZ 10-12.5.  Advised patient to give it on her blood pressure and let us know if she notices any symptomatic lows or if it is persistently elevated.  She will be getting labs at the Cancer center.

## 2021-01-16 NOTE — Assessment & Plan Note (Signed)
Stable. Will refill HCTZ as above.

## 2021-01-16 NOTE — Progress Notes (Signed)
   Sherry Carrillo is a 55 y.o. female who presents today for a virtual office visit.  Assessment/Plan:  Chronic Problems Addressed Today: Essential hypertension Will refill lisinopril-HCTZ 10-12.5.  Advised patient to give it on her blood pressure and let us know if she notices any symptomatic lows or if it is persistently elevated.  She will be getting labs at the Cancer center.   Leg edema Stable. Will refill HCTZ as above.   Menopause Did not find Celexa effective.  Cannot take estrogen due to history of breast cancer.  She has been prescribed gabapentin as well but did not like the side effects.  She will stay off medications for now although advised patient she could try taking a nighttime dose of gabapentin in the future to see if this helps.  Adjustment disorder Patient has been under a lot of stress due to her recent cancer diagnosis.  Feels like she has been handling it well.  She has good support structure in place with her family.  She feels like she has maintain a positive attitude.  She will let me know if she needs any further support.  She will follow-up soon for in person office visit.    Subjective:  HPI:  See A/p for status of chronic conditions.  She would like to be restarted on HCTZ.  She has been on this in the past.  She has done well with this to help manage her leg edema.  She has not been consistently checking her blood pressure.       Objective/Observations  Physical Exam: Gen: NAD, resting comfortably Pulm: Normal work of breathing Neuro: Grossly normal, moves all extremities Psych: Normal affect and thought content  Virtual Visit via Video   I connected with Sherry Carrillo on 01/16/21 at  9:20 AM EDT by a video enabled telemedicine application and verified that I am speaking with the correct person using two identifiers. The limitations of evaluation and management by telemedicine and the availability of in person appointments were discussed. The patient  expressed understanding and agreed to proceed.   Patient location: Home Provider location: [place] participating in the virtual visit: Myself and Patient     [name]. [name], MD 01/16/2021 9:52 AM

## 2021-01-16 NOTE — Assessment & Plan Note (Signed)
Patient has been under a lot of stress due to her recent cancer diagnosis.  Feels like she has been handling it well.  She has good support structure in place with her family.  She feels like she has maintain a positive attitude.  She will let me know if she needs any further support.

## 2021-01-17 ENCOUNTER — Ambulatory Visit
Admission: RE | Admit: 2021-01-17 | Discharge: 2021-01-17 | Disposition: A | Discharge status: Home / Self Care | Payer: BC Managed Care – PPO | Source: Ambulatory Visit | Attending: Radiation Oncology | Admitting: Radiation Oncology

## 2021-01-17 DIAGNOSIS — C50412 Malignant neoplasm of upper-outer quadrant of left female breast: Secondary | ICD-10-CM | POA: Diagnosis not present

## 2021-01-18 ENCOUNTER — Ambulatory Visit
Admission: RE | Admit: 2021-01-18 | Discharge: 2021-01-18 | Disposition: A | Discharge status: Home / Self Care | Payer: BC Managed Care – PPO | Source: Ambulatory Visit | Attending: Radiation Oncology | Admitting: Radiation Oncology

## 2021-01-18 ENCOUNTER — Other Ambulatory Visit: Payer: Self-pay

## 2021-01-18 DIAGNOSIS — C50412 Malignant neoplasm of upper-outer quadrant of left female breast: Secondary | ICD-10-CM | POA: Diagnosis not present

## 2021-01-19 ENCOUNTER — Other Ambulatory Visit: Payer: Self-pay

## 2021-01-19 ENCOUNTER — Ambulatory Visit
Admission: RE | Admit: 2021-01-19 | Discharge: 2021-01-19 | Disposition: A | Discharge status: Home / Self Care | Payer: BC Managed Care – PPO | Source: Ambulatory Visit | Attending: Radiation Oncology | Admitting: Radiation Oncology

## 2021-01-19 DIAGNOSIS — C50412 Malignant neoplasm of upper-outer quadrant of left female breast: Secondary | ICD-10-CM | POA: Diagnosis not present

## 2021-01-20 ENCOUNTER — Ambulatory Visit: Payer: BC Managed Care – PPO

## 2021-01-20 DIAGNOSIS — C50412 Malignant neoplasm of upper-outer quadrant of left female breast: Secondary | ICD-10-CM | POA: Diagnosis not present

## 2021-01-23 ENCOUNTER — Other Ambulatory Visit: Payer: Self-pay

## 2021-01-23 ENCOUNTER — Ambulatory Visit: Payer: BC Managed Care – PPO

## 2021-01-23 DIAGNOSIS — C50412 Malignant neoplasm of upper-outer quadrant of left female breast: Secondary | ICD-10-CM | POA: Diagnosis not present

## 2021-01-24 ENCOUNTER — Ambulatory Visit
Admission: RE | Admit: 2021-01-24 | Discharge: 2021-01-24 | Disposition: A | Discharge status: Home / Self Care | Payer: BC Managed Care – PPO | Source: Ambulatory Visit | Attending: Radiation Oncology | Admitting: Radiation Oncology

## 2021-01-24 DIAGNOSIS — Z510 Encounter for antineoplastic radiation therapy: Secondary | ICD-10-CM | POA: Insufficient documentation

## 2021-01-24 DIAGNOSIS — Z170 Estrogen receptor positive status [ER+]: Secondary | ICD-10-CM | POA: Diagnosis not present

## 2021-01-24 DIAGNOSIS — K760 Fatty (change of) liver, not elsewhere classified: Secondary | ICD-10-CM | POA: Diagnosis not present

## 2021-01-24 DIAGNOSIS — R232 Flushing: Secondary | ICD-10-CM | POA: Diagnosis not present

## 2021-01-24 DIAGNOSIS — Z79811 Long term (current) use of aromatase inhibitors: Secondary | ICD-10-CM | POA: Diagnosis not present

## 2021-01-24 DIAGNOSIS — F419 Anxiety disorder, unspecified: Secondary | ICD-10-CM | POA: Diagnosis not present

## 2021-01-24 DIAGNOSIS — I2510 Atherosclerotic heart disease of native coronary artery without angina pectoris: Secondary | ICD-10-CM | POA: Diagnosis not present

## 2021-01-24 DIAGNOSIS — I700 Atherosclerosis of aorta: Secondary | ICD-10-CM | POA: Diagnosis not present

## 2021-01-24 DIAGNOSIS — E042 Nontoxic multinodular goiter: Secondary | ICD-10-CM | POA: Diagnosis not present

## 2021-01-24 DIAGNOSIS — C50412 Malignant neoplasm of upper-outer quadrant of left female breast: Secondary | ICD-10-CM | POA: Diagnosis present

## 2021-01-24 DIAGNOSIS — I10 Essential (primary) hypertension: Secondary | ICD-10-CM | POA: Diagnosis not present

## 2021-01-25 ENCOUNTER — Ambulatory Visit
Admission: RE | Admit: 2021-01-25 | Discharge: 2021-01-25 | Disposition: A | Discharge status: Home / Self Care | Payer: BC Managed Care – PPO | Source: Ambulatory Visit | Attending: Radiation Oncology | Admitting: Radiation Oncology

## 2021-01-25 DIAGNOSIS — C50412 Malignant neoplasm of upper-outer quadrant of left female breast: Secondary | ICD-10-CM | POA: Diagnosis not present

## 2021-01-26 ENCOUNTER — Encounter: Payer: Self-pay | Admitting: *Deleted

## 2021-01-26 ENCOUNTER — Ambulatory Visit: Payer: BC Managed Care – PPO

## 2021-01-26 ENCOUNTER — Other Ambulatory Visit: Payer: Self-pay

## 2021-01-26 DIAGNOSIS — C50412 Malignant neoplasm of upper-outer quadrant of left female breast: Secondary | ICD-10-CM | POA: Diagnosis not present

## 2021-01-27 ENCOUNTER — Inpatient Hospital Stay: Payer: BC Managed Care – PPO | Attending: Hematology | Admitting: Hematology

## 2021-01-27 ENCOUNTER — Encounter: Payer: Self-pay | Admitting: Radiation Oncology

## 2021-01-27 ENCOUNTER — Ambulatory Visit
Admission: RE | Admit: 2021-01-27 | Discharge: 2021-01-27 | Disposition: A | Discharge status: Home / Self Care | Payer: BC Managed Care – PPO | Source: Ambulatory Visit | Attending: Radiation Oncology | Admitting: Radiation Oncology

## 2021-01-27 VITALS — BP 136/66 | HR 84 | Temp 98.2°F | Resp 18 | Ht 60.0 in | Wt 168.4 lb

## 2021-01-27 DIAGNOSIS — C50412 Malignant neoplasm of upper-outer quadrant of left female breast: Secondary | ICD-10-CM | POA: Diagnosis not present

## 2021-01-27 DIAGNOSIS — Z170 Estrogen receptor positive status [ER+]: Secondary | ICD-10-CM | POA: Diagnosis not present

## 2021-01-27 DIAGNOSIS — K760 Fatty (change of) liver, not elsewhere classified: Secondary | ICD-10-CM | POA: Insufficient documentation

## 2021-01-27 DIAGNOSIS — E042 Nontoxic multinodular goiter: Secondary | ICD-10-CM | POA: Insufficient documentation

## 2021-01-27 DIAGNOSIS — Z79811 Long term (current) use of aromatase inhibitors: Secondary | ICD-10-CM | POA: Insufficient documentation

## 2021-01-27 DIAGNOSIS — F419 Anxiety disorder, unspecified: Secondary | ICD-10-CM | POA: Insufficient documentation

## 2021-01-27 DIAGNOSIS — I700 Atherosclerosis of aorta: Secondary | ICD-10-CM | POA: Insufficient documentation

## 2021-01-27 DIAGNOSIS — I2510 Atherosclerotic heart disease of native coronary artery without angina pectoris: Secondary | ICD-10-CM | POA: Insufficient documentation

## 2021-01-27 DIAGNOSIS — R232 Flushing: Secondary | ICD-10-CM | POA: Insufficient documentation

## 2021-01-27 DIAGNOSIS — I10 Essential (primary) hypertension: Secondary | ICD-10-CM | POA: Insufficient documentation

## 2021-01-27 DIAGNOSIS — Z510 Encounter for antineoplastic radiation therapy: Secondary | ICD-10-CM | POA: Insufficient documentation

## 2021-01-27 MED ORDER — ANASTROZOLE 1 MG PO TABS: 1.0000 mg | ORAL_TABLET | Freq: Every day | ORAL | 3 refills | Status: DC

## 2021-01-27 NOTE — Progress Notes (Signed)
[place]   [phone] [phone]   Clinic Follow up Note   Patient Care Team: Vivi Barrack, MD as PCP - General (Family Medicine) Mauro Kaufmann, RN as Oncology Nurse Navigator Rockwell Germany, RN as Oncology Nurse Navigator [name], MD as Consulting Physician (General Surgery) Kyung Rudd, MD as Consulting Physician (Radiation Oncology) Truitt Merle, MD as Consulting Physician (Hematology)  Date of Service:  01/27/2021  CHIEF COMPLAINT: f/u of left breast cancer  CURRENT THERAPY:  Pending adjuvant anastrozole  ASSESSMENT & PLAN:  Sherry Carrillo is a 55 y.o. female with   1. Malignant neoplasm of upper-outer quadrant of left breast, Stage IIA, pT2N1M0, stage IIA, ER+/PR+/HER2-, Grade III, Mammaprint high risk -She underwent Left breast lumpectomy with left axillary sentinel lymph node biopsies on 06/03/20 under Dr. Barry Dienes. Pathology showed: 2.4 cm invasive ductal carcinoma, grade 3; DCIS; one positive lymph node (1/2); positive inferior margin. Her Mammaprint showed high risk luminal type. Margin re-excision 07/27/20 was negative. -Her CT CAP and bone scan from 07/27/20 were negative for distant mets. -To reduce her risk of cancer recurrence, she started adjuvant chemo with docetaxel and Cytoxan (TC) Q3weeks for 4 cycles starting 08/18/20.  -Tolerated cycle 1 poorly with severe stomach pain on day 5 requiring hospitalization. She tolerated cycles 2-4 better with dose reduction and scheduled IVF on day 3, 5 and 6. -She completed all 4 cycles of TC. She received adjuvant radiation 9/20-11/4/22. -Given the strong ER and PR positivity, I do recommend adjuvant aromatase inhibitor to reduce her risk of cancer recurrence,  The potential benefit and side effects, which includes but not limited to, hot flash, skin and vaginal dryness, metabolic changes ( increased blood glucose, cholesterol, weight, etc.), slightly in increased risk of cardiovascular disease,  cataracts, muscular and joint discomfort, osteopenia and osteoporosis, etc, were discussed with her in great details. She is in agreement. I prescribed for her to start in about a month.   2. Hot flashes -She was seen to be post-menopausal on 12/2017 labs with [facility] -She started having hot flashes in Fall 2021. Her gyn started on her Estradial patch in 04/2020 which helps very well. Due to ER/PR positive disease, she stopped in March.  -She experienced vomiting with Effexor. -Due to worsening hot flashes, she was started on gabapentin on 10/20/20, I encouraged her to continue and adjust dose if needed    3. Social Support, Anxiety  -She is married with 2 adult daughters. Her family is aware of her breast cancer diagnosis. -She is working on coping better with diagnosis and chemo.    4. She declined Genetic Testing (08/10/20)     PLAN:  -proceed to final radiation treatment today -port removal 02/02/21 -start anastrozole in about about 3-4 weeks  -lab and survivorship in 2 months -lab and f/u in 5 months   No problem-specific Assessment & Plan notes found for this encounter.   SUMMARY OF ONCOLOGIC HISTORY: Oncology History Overview Note  Cancer Staging Malignant neoplasm of upper-outer quadrant of left breast, estrogen receptor positive ([place]) Staging form: Breast, AJCC 8th Edition - Clinical stage from 05/31/2020: Stage IIA (cT2, cN0, cM0, G3, ER+, PR+, HER2-) - Signed by Truitt Merle, MD on 06/01/2020 Stage prefix: Initial diagnosis    Malignant neoplasm of upper-outer quadrant of left breast, estrogen receptor positive ([place])  05/17/2020 Mammogram   IMPRESSION: 1. Highly suspicious 2.3 cm mass involving the UPPER OUTER QUADRANT of the LEFT breast at the  1 o'clock position approximately 8 cm from the nipple. 2. No pathologic LEFT axillary lymphadenopathy. 3. No mammographic evidence of malignancy involving the RIGHT breast.   05/17/2020 Initial Biopsy    Diagnosis Breast, left, needle core biopsy, 1 o'clock - INVASIVE MAMMARY CARCINOMA.  Microscopic Comment The carcinoma appears grade 3. The greatest linear extent of tumor in any one core is 10 mm. E-cadherin will be reported separately. Ancillary studies will be reported separately. Results reported to [facility] on 05/18/2020. Dr. Tresa Moore reviewed the case. ADDENDUM: Immunohistochemistry for E-cadherin is positive supporting a ductal phenotype.    05/17/2020 Receptors her2   PROGNOSTIC INDICATORS Results: IMMUNOHISTOCHEMICAL AND MORPHOMETRIC ANALYSIS PERFORMED MANUALLY The tumor cells are NEGATIVE for Her2 (1+). Estrogen Receptor: 90%, POSITIVE, STRONG STAINING INTENSITY Progesterone Receptor: 60%, POSITIVE, STRONG STAINING INTENSITY Proliferation Marker Ki67: 70%   05/31/2020 Initial Diagnosis   Malignant neoplasm of upper-outer quadrant of left breast, estrogen receptor positive ([place])   05/31/2020 Cancer Staging   Staging form: Breast, AJCC 8th Edition - Clinical stage from 05/31/2020: Stage IIA (cT2, cN0, cM0, G3, ER+, PR+, HER2-) - Signed by Truitt Merle, MD on 06/01/2020 Stage prefix: Initial diagnosis    06/03/2020 Cancer Staging   Staging form: Breast, AJCC 8th Edition - Pathologic stage from 06/03/2020: Stage IIA (pT2, pN1a, cM0, G3, ER+, PR+, HER2-) - Signed by Truitt Merle, MD on 06/29/2020 Stage prefix: Initial diagnosis Multigene prognostic tests performed: MammaPrint Histologic grading system: 3 grade system    06/03/2020 Surgery   Left breast lumpectomy with left axillary sentinel lymph node  biopsies  A. 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  - High grade ductal carcinoma in situ.  - Invasive carcinoma is at the inferior margin focally and <0.1 cm from  the anterior margin focally.  - In situ carcinoma is <0.1 cm from the anterior margin focally.  - Biopsy site.  - See oncology table.   B. LYMPH NODE, LEFT  AXILLARY, #1, SENTINEL, EXCISION:  - Metastatic carcinoma in one of one lymph nodes (1/1).   C. LYMPH NODE, LEFT AXILLARY, #2, SENTINEL, EXCISION:  - One of one lymph nodes negative for carcinoma (0/1).   07/20/2020 Imaging   CT CAP  IMPRESSION: 1. Surgical clips in the area of the LEFT breast laterally along the margin of water density well-circumscribed area that measures approximately 4.2 x 3.7 cm. This is likely a postoperative seroma. Correlate with any pain or developing symptoms that would suggest infection. No gas or secondary findings to suggest this at this time. 2. No evidence of metastatic disease in the chest, abdomen or pelvis. 3. Hepatic steatosis without focal lesion. 4. Calcified coronary artery disease. 5. Aortic atherosclerosis. 6. Mildly nodular thyroid. 7. Lesion in the RIGHT thyroid measuring up to 1.8 cm. Recommend thyroid US (ref: J Am Coll Radiol. 2015 Feb;12(2): 143-50).     07/20/2020 Imaging   Bone Scan  IMPRESSION: Signs of spondylosis of the thoracic and lumbar spine. No scintigraphic evidence of metastatic disease.     07/27/2020 Surgery   RE-EXCISION OF LEFT BREAST LUMPECTOMY and PAC placement by Dr Barry Dienes  FINAL MICROSCOPIC DIAGNOSIS:   A. BREAST, LEFT ANTERIOR MARGIN, EXCISION:  - Prior procedure site changes.  No carcinoma identified.   B. BREAST, LEFT INFERIOR MARGIN, EXCISION:  - Prior procedure site changes.  No carcinoma identified.    07/27/2020 Pathology Results   FINAL MICROSCOPIC DIAGNOSIS:   A. BREAST, LEFT ANTERIOR MARGIN, EXCISION:  -  Prior procedure site changes.  No carcinoma identified.   B. BREAST, LEFT INFERIOR MARGIN, EXCISION:  - Prior procedure site changes.  No carcinoma identified.     08/18/2020 -  Chemotherapy   docetaxel and Cytoxan (TC) Q3weeks for 4 cycles.        INTERVAL HISTORY:  Sherry Carrillo is here for a follow up of breast cancer. She was last seen by me on 10/24/20. She presents to the clinic  alone.   All other systems were reviewed with the patient and are negative.  MEDICAL HISTORY:  Past Medical History:  Diagnosis Date   Abnormal pap 2009   PT HAD SURGERY BUT DON'T KNOW WHAT TYPE   Breast cancer ([place]) 04/27/2020   Headache(784.0)    otc meds prn   Hypertension    Hyperthyroidism    SVD (spontaneous vaginal delivery)    x 2    SURGICAL HISTORY: Past Surgical History:  Procedure Laterality Date   BREAST LUMPECTOMY WITH RADIOACTIVE SEED AND SENTINEL LYMPH NODE BIOPSY Left 06/03/2020   Procedure: RADIOACTIVE SEED GUIDED LEFT BREAST LUMPECTOMY, LEFT AXILLARY SENTINEL LYMPH NODE BIOPSY;  Surgeon: [name], MD;  Location: [facility];  Service: General;  Laterality: Left;   FOOT SURGERY     left   PORTACATH PLACEMENT Left 07/27/2020   Procedure: INSERTION PORT-A-CATH;  Surgeon: [name], MD;  Location: [facility];  Service: General;  Laterality: Left;   RE-EXCISION OF BREAST LUMPECTOMY Left 07/27/2020   Procedure: RE-EXCISION OF LEFT BREAST LUMPECTOMY;  Surgeon: [name], MD;  Location: [facility];  Service: General;  Laterality: Left;   TUBAL LIGATION     WISDOM TOOTH EXTRACTION      I have reviewed the social history and family history with the patient and they are unchanged from previous note.  ALLERGIES:  is allergic to sulfa antibiotics.  MEDICATIONS:  Current Outpatient Medications  Medication Sig Dispense Refill   lisinopril-hydrochlorothiazide (ZESTORETIC) 20-12.5 MG tablet Take 1 tablet by mouth daily. 90 tablet 3   No current facility-administered medications for this visit.   Facility-Administered Medications Ordered in Other Visits  Medication Dose Route Frequency Provider Last Rate Last Admin   sodium chloride flush (NS) 0.9 % injection 10 mL  10 mL Intracatheter PRN Truitt Merle, MD   10 mL at 09/08/20 1437    PHYSICAL EXAMINATION: ECOG PERFORMANCE STATUS: 0 - Asymptomatic  Vitals:   01/27/21 1453  BP: 136/66  Pulse: 84  Resp: 18  Temp: 98.2 F  (36.8 C)  SpO2: 98%   Wt Readings from Last 3 Encounters:  01/27/21 168 lb 6.4 oz (76.4 kg)  01/16/21 160 lb (72.6 kg)  12/06/20 165 lb 2 oz (74.9 kg)     GENERAL:alert, no distress and comfortable SKIN: skin color normal, no rashes or significant lesions EYES: normal, Conjunctiva are pink and non-injected, sclera clear  NEURO: alert & oriented x 3 with fluent speech  LABORATORY DATA:  I have reviewed the data as listed CBC Latest Ref Rng & Units 10/20/2020 09/29/2020 09/12/2020  WBC 4.0 - 10.5 K/uL 9.5 7.3 8.4  Hemoglobin 12.0 - 15.0 g/dL 10.7(L) 11.6(L) 11.3(L)  Hematocrit 36.0 - 46.0 % 33.0(L) 35.3(L) 36.3  Platelets 150 - 400 K/uL 383 335 254     CMP Latest Ref Rng & Units 10/20/2020 09/29/2020 09/12/2020  Glucose 70 - 99 mg/dL 171(H) 130(H) 78  BUN 6 - 20 mg/dL _0 Creatinine 0.44 - 1.00 mg/dL 0.75 0.68 0.60  Sodium 135 -  145 mmol/L 139 141 137  Potassium 3.5 - 5.1 mmol/L 4.3 4.4 4.0  Chloride 98 - 111 mmol/L 107 108 108  CO2 22 - 32 mmol/L 23 24 21(L)  Calcium 8.9 - 10.3 mg/dL 9.3 9.3 8.2(L)  Total Protein 6.5 - 8.1 g/dL 6.4(L) 6.7 5.7(L)  Total Bilirubin 0.3 - 1.2 mg/dL 0.2(L) 0.3 0.8  Alkaline Phos 38 - 126 U/L 91 102 68  AST 15 - 41 U/L 16 33 15  ALT 0 - 44 U/L 15 58(H) 17      RADIOGRAPHIC STUDIES: I have personally reviewed the radiological images as listed and agreed with the findings in the report. No results found.    No orders of the defined types were placed in this encounter.  All questions were answered. The patient knows to call the clinic with any problems, questions or concerns. No barriers to learning was detected. The total time spent in the appointment was 30 minutes.     Truitt Merle, MD 01/27/2021   I, Wilburn Mylar, am acting as scribe for Truitt Merle, MD.   I have reviewed the above documentation for accuracy and completeness, and I agree with the above.

## 2021-01-28 ENCOUNTER — Encounter: Payer: Self-pay | Admitting: Hematology

## 2021-01-30 ENCOUNTER — Telehealth: Payer: Self-pay | Admitting: Hematology

## 2021-01-30 NOTE — Telephone Encounter (Signed)
Scheduled follow-up appointments per 11/4 los. Patient is aware. Mailed calendar.

## 2021-01-31 ENCOUNTER — Encounter (HOSPITAL_COMMUNITY): Payer: Self-pay | Admitting: General Surgery

## 2021-01-31 ENCOUNTER — Other Ambulatory Visit: Payer: Self-pay

## 2021-01-31 NOTE — Progress Notes (Signed)
Spoke with pt for pre-op call. Pt denies cardiac history. Pt has hx of HTN, but states she is not diabetic.   Pt's surgery is scheduled as ambulatory so no Covid test is required prior to surgery.

## 2021-02-02 ENCOUNTER — Ambulatory Visit (HOSPITAL_COMMUNITY)
Admission: RE | Admit: 2021-02-02 | Discharge: 2021-02-02 | Disposition: A | Discharge status: Home / Self Care | Payer: BC Managed Care – PPO | Attending: General Surgery | Admitting: General Surgery

## 2021-02-02 ENCOUNTER — Ambulatory Visit (HOSPITAL_COMMUNITY): Payer: BC Managed Care – PPO | Admitting: Certified Registered Nurse Anesthetist

## 2021-02-02 ENCOUNTER — Encounter (HOSPITAL_COMMUNITY): Payer: Self-pay | Admitting: General Surgery

## 2021-02-02 ENCOUNTER — Encounter (HOSPITAL_COMMUNITY)
Admission: RE | Disposition: A | Discharge status: Home / Self Care | Payer: Self-pay | Source: Home / Self Care | Attending: General Surgery

## 2021-02-02 DIAGNOSIS — Z9221 Personal history of antineoplastic chemotherapy: Secondary | ICD-10-CM | POA: Diagnosis not present

## 2021-02-02 DIAGNOSIS — Z923 Personal history of irradiation: Secondary | ICD-10-CM | POA: Insufficient documentation

## 2021-02-02 DIAGNOSIS — Z452 Encounter for adjustment and management of vascular access device: Secondary | ICD-10-CM | POA: Insufficient documentation

## 2021-02-02 DIAGNOSIS — Z853 Personal history of malignant neoplasm of breast: Secondary | ICD-10-CM | POA: Diagnosis not present

## 2021-02-02 DIAGNOSIS — Z8616 Personal history of COVID-19: Secondary | ICD-10-CM | POA: Diagnosis not present

## 2021-02-02 HISTORY — PX: PORT-A-CATH REMOVAL: SHX5289

## 2021-02-02 HISTORY — DX: Gastro-esophageal reflux disease without esophagitis: K21.9

## 2021-02-02 LAB — CBC
HCT: 41.8 % (ref 36.0–46.0)
Hemoglobin: 13.4 g/dL (ref 12.0–15.0)
MCH: 27.1 pg (ref 26.0–34.0)
MCHC: 32.1 g/dL (ref 30.0–36.0)
MCV: 84.4 fL (ref 80.0–100.0)
Platelets: 172 10*3/uL (ref 150–400)
RBC: 4.95 MIL/uL (ref 3.87–5.11)
RDW: 13.7 % (ref 11.5–15.5)
WBC: 4 10*3/uL (ref 4.0–10.5)
nRBC: 0 % (ref 0.0–0.2)

## 2021-02-02 LAB — BASIC METABOLIC PANEL
Anion gap: 8 (ref 5–15)
BUN: 13 mg/dL (ref 6–20)
CO2: 25 mmol/L (ref 22–32)
Calcium: 9.1 mg/dL (ref 8.9–10.3)
Chloride: 105 mmol/L (ref 98–111)
Creatinine, Ser: 0.54 mg/dL (ref 0.44–1.00)
GFR, Estimated: >60 mL/min (ref >60)
Glucose, Bld: 112 mg/dL — ABNORMAL HIGH (ref 70–99)
Potassium: 3.9 mmol/L (ref 3.5–5.1)
Sodium: 138 mmol/L (ref 135–145)

## 2021-02-02 SURGERY — REMOVAL PORT-A-CATH
Anesthesia: Monitor Anesthesia Care | Site: Chest

## 2021-02-02 MED ORDER — OXYCODONE HCL 5 MG/5ML PO SOLN: 5.0000 mg | Freq: Once | ORAL | Status: DC | PRN

## 2021-02-02 MED ORDER — 0.9 % SODIUM CHLORIDE (POUR BTL) OPTIME
TOPICAL | Status: DC | PRN
  Administered 2021-02-02: 1000 mL

## 2021-02-02 MED ORDER — DEXAMETHASONE SODIUM PHOSPHATE 10 MG/ML IJ SOLN
INTRAMUSCULAR | Status: AC
  Filled 2021-02-02: qty 1

## 2021-02-02 MED ORDER — LIDOCAINE HCL 1 % IJ SOLN
INTRAMUSCULAR | Status: DC | PRN
  Administered 2021-02-02: 10 mL via INTRAMUSCULAR

## 2021-02-02 MED ORDER — MIDAZOLAM HCL 2 MG/2ML IJ SOLN
INTRAMUSCULAR | Status: AC
  Filled 2021-02-02: qty 2

## 2021-02-02 MED ORDER — ONDANSETRON HCL 4 MG/2ML IJ SOLN
INTRAMUSCULAR | Status: AC
  Filled 2021-02-02: qty 2

## 2021-02-02 MED ORDER — ONDANSETRON HCL 4 MG/2ML IJ SOLN
INTRAMUSCULAR | Status: DC | PRN
  Administered 2021-02-02: 4 mg via INTRAVENOUS

## 2021-02-02 MED ORDER — OXYCODONE HCL 5 MG PO TABS: 5.0000 mg | ORAL_TABLET | Freq: Once | ORAL | Status: DC | PRN

## 2021-02-02 MED ORDER — AMISULPRIDE (ANTIEMETIC) 5 MG/2ML IV SOLN: 10.0000 mg | Freq: Once | INTRAVENOUS | Status: DC | PRN

## 2021-02-02 MED ORDER — ACETAMINOPHEN 500 MG PO TABS: 1000.0000 mg | ORAL_TABLET | ORAL | Status: DC

## 2021-02-02 MED ORDER — CHLORHEXIDINE GLUCONATE 0.12 % MT SOLN
OROMUCOSAL | Status: AC
  Administered 2021-02-02: 15 mL via OROMUCOSAL
  Filled 2021-02-02: qty 15

## 2021-02-02 MED ORDER — FENTANYL CITRATE (PF) 250 MCG/5ML IJ SOLN
INTRAMUSCULAR | Status: DC | PRN
  Administered 2021-02-02: 25 ug via INTRAVENOUS

## 2021-02-02 MED ORDER — MIDAZOLAM HCL 2 MG/2ML IJ SOLN
INTRAMUSCULAR | Status: DC | PRN
Start: 2021-02-02 — End: 2021-02-02
  Administered 2021-02-02: 2 mg via INTRAVENOUS

## 2021-02-02 MED ORDER — LIDOCAINE 2% (20 MG/ML) 5 ML SYRINGE
INTRAMUSCULAR | Status: AC
  Filled 2021-02-02: qty 5

## 2021-02-02 MED ORDER — BUPIVACAINE-EPINEPHRINE (PF) 0.25% -1:200000 IJ SOLN
INTRAMUSCULAR | Status: AC
  Filled 2021-02-02: qty 30

## 2021-02-02 MED ORDER — CHLORHEXIDINE GLUCONATE 0.12 % MT SOLN: 15.0000 mL | Freq: Once | OROMUCOSAL | Status: AC

## 2021-02-02 MED ORDER — CEFAZOLIN SODIUM-DEXTROSE 2-4 GM/100ML-% IV SOLN
INTRAVENOUS | Status: AC
  Filled 2021-02-02: qty 100

## 2021-02-02 MED ORDER — CEFAZOLIN SODIUM-DEXTROSE 2-4 GM/100ML-% IV SOLN
2.0000 g | INTRAVENOUS | Status: AC
  Administered 2021-02-02: 2 g via INTRAVENOUS

## 2021-02-02 MED ORDER — LACTATED RINGERS IV SOLN: INTRAVENOUS | Status: DC

## 2021-02-02 MED ORDER — LIDOCAINE 2% (20 MG/ML) 5 ML SYRINGE
INTRAMUSCULAR | Status: DC | PRN
  Administered 2021-02-02: 80 mg via INTRAVENOUS

## 2021-02-02 MED ORDER — CHLORHEXIDINE GLUCONATE CLOTH 2 % EX PADS: 6.0000 | MEDICATED_PAD | Freq: Once | CUTANEOUS | Status: DC

## 2021-02-02 MED ORDER — FENTANYL CITRATE (PF) 250 MCG/5ML IJ SOLN
INTRAMUSCULAR | Status: AC
  Filled 2021-02-02: qty 5

## 2021-02-02 MED ORDER — PROPOFOL 10 MG/ML IV BOLUS
INTRAVENOUS | Status: AC
  Filled 2021-02-02: qty 20

## 2021-02-02 MED ORDER — DEXAMETHASONE SODIUM PHOSPHATE 10 MG/ML IJ SOLN
INTRAMUSCULAR | Status: DC | PRN
Start: 2021-02-02 — End: 2021-02-02
  Administered 2021-02-02: 5 mg via INTRAVENOUS

## 2021-02-02 MED ORDER — ORAL CARE MOUTH RINSE: 15.0000 mL | Freq: Once | OROMUCOSAL | Status: AC

## 2021-02-02 MED ORDER — FENTANYL CITRATE (PF) 100 MCG/2ML IJ SOLN: 25.0000 ug | INTRAMUSCULAR | Status: DC | PRN

## 2021-02-02 MED ORDER — PROPOFOL 500 MG/50ML IV EMUL
INTRAVENOUS | Status: DC | PRN
  Administered 2021-02-02: 75 ug/kg/min via INTRAVENOUS

## 2021-02-02 MED ORDER — ACETAMINOPHEN 500 MG PO TABS
ORAL_TABLET | ORAL | Status: AC
  Filled 2021-02-02: qty 2

## 2021-02-02 MED ORDER — ONDANSETRON HCL 4 MG/2ML IJ SOLN: 4.0000 mg | Freq: Once | INTRAMUSCULAR | Status: DC | PRN

## 2021-02-02 MED ORDER — OXYCODONE HCL 5 MG PO TABS: 5.0000 mg | ORAL_TABLET | Freq: Four times a day (QID) | ORAL | 0 refills | Status: DC | PRN

## 2021-02-02 MED ORDER — PROPOFOL 10 MG/ML IV BOLUS
INTRAVENOUS | Status: DC | PRN
  Administered 2021-02-02: 20 mg via INTRAVENOUS
  Administered 2021-02-02 (×2): 30 mg via INTRAVENOUS

## 2021-02-02 MED ORDER — LIDOCAINE HCL (PF) 1 % IJ SOLN
INTRAMUSCULAR | Status: AC
  Filled 2021-02-02: qty 30

## 2021-02-02 SURGICAL SUPPLY — 31 items
ADH SKN CLS APL DERMABOND .7 (GAUZE/BANDAGES/DRESSINGS) ×1
BAG COUNTER SPONGE SURGICOUNT (BAG) ×2 IMPLANT
BAG SPNG CNTER NS LX DISP (BAG) ×1
CHLORAPREP W/TINT 10.5 ML (MISCELLANEOUS) ×2 IMPLANT
COVER SURGICAL LIGHT HANDLE (MISCELLANEOUS) ×2 IMPLANT
DECANTER SPIKE VIAL GLASS SM (MISCELLANEOUS) ×4 IMPLANT
DERMABOND ADVANCED (GAUZE/BANDAGES/DRESSINGS) ×1
DERMABOND ADVANCED .7 DNX12 (GAUZE/BANDAGES/DRESSINGS) ×1 IMPLANT
DRAPE LAPAROTOMY 100X72 PEDS (DRAPES) ×2 IMPLANT
ELECT CAUTERY BLADE 6.4 (BLADE) ×2 IMPLANT
ELECT REM PT RETURN 9FT ADLT (ELECTROSURGICAL) ×2
ELECTRODE REM PT RTRN 9FT ADLT (ELECTROSURGICAL) ×1 IMPLANT
GAUZE 4X4 16PLY ~~LOC~~+RFID DBL (SPONGE) ×2 IMPLANT
GLOVE SURG ENC MOIS LTX SZ6 (GLOVE) ×2 IMPLANT
GLOVE SURG UNDER LTX SZ6.5 (GLOVE) ×2 IMPLANT
GOWN STRL REUS W/ TWL LRG LVL3 (GOWN DISPOSABLE) ×1 IMPLANT
GOWN STRL REUS W/TWL 2XL LVL3 (GOWN DISPOSABLE) ×2 IMPLANT
GOWN STRL REUS W/TWL LRG LVL3 (GOWN DISPOSABLE) ×2
KIT BASIN OR (CUSTOM PROCEDURE TRAY) ×2 IMPLANT
KIT TURNOVER KIT B (KITS) ×2 IMPLANT
NDL HYPO 25GX1X1/2 BEV (NEEDLE) ×1 IMPLANT
NEEDLE HYPO 25GX1X1/2 BEV (NEEDLE) ×2 IMPLANT
NS IRRIG 1000ML POUR BTL (IV SOLUTION) ×2 IMPLANT
PACK GENERAL/GYN (CUSTOM PROCEDURE TRAY) ×2 IMPLANT
PAD ARMBOARD 7.5X6 YLW CONV (MISCELLANEOUS) ×4 IMPLANT
SUT MON AB 4-0 PC3 18 (SUTURE) ×2 IMPLANT
SUT VIC AB 3-0 SH 27 (SUTURE) ×2
SUT VIC AB 3-0 SH 27X BRD (SUTURE) ×1 IMPLANT
SYR CONTROL 10ML LL (SYRINGE) ×2 IMPLANT
TOWEL GREEN STERILE (TOWEL DISPOSABLE) ×2 IMPLANT
TOWEL GREEN STERILE FF (TOWEL DISPOSABLE) ×2 IMPLANT

## 2021-02-02 NOTE — Anesthesia Postprocedure Evaluation (Signed)
Anesthesia Post Note  Patient: Sherry Carrillo  Procedure(s) Performed: REMOVAL PORT-A-CATH (Chest)     Patient location during evaluation: PACU Anesthesia Type: MAC Level of consciousness: awake and alert Pain management: pain level controlled Vital Signs Assessment: post-procedure vital signs reviewed and stable Respiratory status: spontaneous breathing, nonlabored ventilation and respiratory function stable Cardiovascular status: blood pressure returned to baseline and stable Postop Assessment: no apparent nausea or vomiting Anesthetic complications: no   No notable events documented.  Last Vitals:  Vitals:   02/02/21 1400 02/02/21 1415  BP: (!) 141/73 (!) 147/86  Pulse: 73 64  Resp: 19 15  Temp:  (!) 36.4 C  SpO2: 98% 99%    Last Pain:  Vitals:   02/02/21 1415  TempSrc:   PainSc: 0-No pain                 Lidia Collum

## 2021-02-02 NOTE — Discharge Instructions (Addendum)
[place] Office Phone Number [phone]   POST OP INSTRUCTIONS  Always review your discharge instruction sheet given to you by the facility where your surgery was performed.  IF YOU HAVE DISABILITY OR FAMILY LEAVE FORMS, YOU MUST BRING THEM TO THE OFFICE FOR PROCESSING.  DO NOT GIVE THEM TO YOUR DOCTOR.  A prescription for pain medication may be given to you upon discharge.  Take your pain medication as prescribed, if needed.  If narcotic pain medicine is not needed, then you may take acetaminophen (Tylenol) or ibuprofen (Advil) as needed. Take your usually prescribed medications unless otherwise directed If you need a refill on your pain medication, please contact your pharmacy.  They will contact our office to request authorization.  Prescriptions will not be filled after 5pm or on week-ends. You should eat very light the first 24 hours after surgery, such as soup, crackers, pudding, etc.  Resume your normal diet the day after surgery It is common to experience some constipation if taking pain medication after surgery.  Increasing fluid intake and taking a stool softener will usually help or prevent this problem from occurring.  A mild laxative (Milk of Magnesia or Miralax) should be taken according to package directions if there are no bowel movements after 48 hours. You may shower in 48 hours.  The surgical glue will flake off in 2-3 weeks.   ACTIVITIES:  No strenuous activity or heavy lifting for 1 week.   You may drive when you no longer are taking prescription pain medication, you can comfortably wear a seatbelt, and you can safely maneuver your car and apply brakes. RETURN TO WORK:  __________as tolerated if no lifting for 1 week._______________ You should see your doctor in the office for a follow-up appointment approximately three-four weeks after your surgery.    WHEN TO CALL YOUR DOCTOR: Fever over 101.0 Nausea and/or vomiting. Extreme swelling or  bruising. Continued bleeding from incision. Increased pain, redness, or drainage from the incision.  The clinic staff is available to answer your questions during regular business hours.  Please don't hesitate to call and ask to speak to one of the nurses for clinical concerns.  If you have a medical emergency, go to the nearest emergency room or call 911.  A surgeon from [facility] is always on call at the hospital.  For further questions, please visit [email or web address]

## 2021-02-02 NOTE — H&P (Signed)
TORIAN THOENNES is an 55 y.o. female.   Chief Complaint: port inp lace HPI:  Pt is a lovely 55 yo F who had breast conserving surgery for left breast cancer.  She has completed chemotherapy and radiation.  She did quite well overall.  She desires port removal.   Past Medical History:  Diagnosis Date   Abnormal pap 2009   PT HAD SURGERY BUT DON'T KNOW WHAT TYPE   Breast cancer ([place]) 04/27/2020   COVID 2021   GERD (gastroesophageal reflux disease)    Headache(784.0)    otc meds prn   Hypertension    Hyperthyroidism    SVD (spontaneous vaginal delivery)    x 2    Past Surgical History:  Procedure Laterality Date   BREAST LUMPECTOMY WITH RADIOACTIVE SEED AND SENTINEL LYMPH NODE BIOPSY Left 06/03/2020   Procedure: RADIOACTIVE SEED GUIDED LEFT BREAST LUMPECTOMY, LEFT AXILLARY SENTINEL LYMPH NODE BIOPSY;  Surgeon: [name], MD;  Location: [facility];  Service: General;  Laterality: Left;   FOOT SURGERY     left   PORTACATH PLACEMENT Left 07/27/2020   Procedure: INSERTION PORT-A-CATH;  Surgeon: [name], MD;  Location: [facility];  Service: General;  Laterality: Left;   RE-EXCISION OF BREAST LUMPECTOMY Left 07/27/2020   Procedure: RE-EXCISION OF LEFT BREAST LUMPECTOMY;  Surgeon: [name], MD;  Location: [facility];  Service: General;  Laterality: Left;   TUBAL LIGATION     WISDOM TOOTH EXTRACTION      Family History  Problem Relation Age of Onset   Cancer Mother        LUNG   Hypertension Mother    Diabetes Mother    Heart disease Father    Diabetes Maternal Grandmother    Heart disease Paternal Grandmother    Heart disease Paternal Grandfather    Thyroid disease Sister    Diabetes Brother    Cancer Maternal Aunt 56       breast cancer    Social History:  reports that she has never smoked. She has never used smokeless tobacco. She reports that she does not drink alcohol and does not use drugs.  Allergies:  Allergies  Allergen Reactions   Sulfa Antibiotics Nausea Only     Medications Prior to Admission  Medication Sig Dispense Refill   acetaminophen (TYLENOL) 325 MG tablet Take 650 mg by mouth every 6 (six) hours as needed.     lisinopril-hydrochlorothiazide (ZESTORETIC) 20-12.5 MG tablet Take 1 tablet by mouth daily. 90 tablet 3   anastrozole (ARIMIDEX) 1 MG tablet Take 1 tablet (1 mg total) by mouth daily. 30 tablet 3    No results found for this or any previous visit (from the past 48 hour(s)). No results found.  Review of Systems  All other systems reviewed and are negative.  Blood pressure (!) 171/90, pulse 72, temperature 98.1 F (36.7 C), temperature source Oral, resp. rate 17, height 5' (1.524 m), weight 72.6 kg, SpO2 96 %. Physical Exam Vitals reviewed.  Constitutional:      General: She is not in acute distress.    Appearance: Normal appearance. She is not ill-appearing.  HENT:     Head: Normocephalic and atraumatic.  Eyes:     General: No scleral icterus.    Extraocular Movements: Extraocular movements intact.     Pupils: Pupils are equal, round, and reactive to light.  Cardiovascular:     Rate and Rhythm: Normal rate and regular rhythm.     Pulses: Normal pulses.  Pulmonary:  Effort: Pulmonary effort is normal. No respiratory distress.     Comments: Left sided port.  Left axillary incision with some skin changes c/w XRT. Abdominal:     General: Abdomen is flat.  Musculoskeletal:     Cervical back: Normal range of motion and neck supple.  Skin:    General: Skin is warm and dry.     Capillary Refill: Capillary refill takes 2 to 3 seconds.  Neurological:     General: No focal deficit present.     Mental Status: She is alert and oriented to person, place, and time.  Psychiatric:        Mood and Affect: Mood normal.        Behavior: Behavior normal.        Thought Content: Thought content normal.        Judgment: Judgment normal.     Assessment/Plan Port in place. Left breast cancer  Discussed surgery, risks,  recovery, post op restrictions with the patient.  She wishes to proceed.     [name], MD 02/02/2021, 12:12 PM

## 2021-02-02 NOTE — Anesthesia Procedure Notes (Signed)
Procedure Name: MAC Date/Time: 02/02/2021 1:03 PM Performed by: Reece Agar, CRNA Pre-anesthesia Checklist: Patient identified, Emergency Drugs available, Suction available, Patient being monitored and Timeout performed Patient Re-evaluated:Patient Re-evaluated prior to induction Oxygen Delivery Method: Simple face mask Preoxygenation: Pre-oxygenation with 100% oxygen Induction Type: IV induction Placement Confirmation: positive ETCO2

## 2021-02-02 NOTE — Transfer of Care (Signed)
Immediate Anesthesia Transfer of Care Note  Patient: Sherry Carrillo  Procedure(s) Performed: REMOVAL PORT-A-CATH (Chest)  Patient Location: PACU  Anesthesia Type:MAC  Level of Consciousness: awake and alert   Airway & Oxygen Therapy: Patient Spontanous Breathing  Post-op Assessment: Report given to RN and Post -op Vital signs reviewed and stable  Post vital signs: Reviewed and stable  Last Vitals:  Vitals Value Taken Time  BP 124/98 02/02/21 1345  Temp    Pulse 83 02/02/21 1346  Resp 23 02/02/21 1346  SpO2 97 % 02/02/21 1346  Vitals shown include unvalidated device data.  Last Pain:  Vitals:   02/02/21 1143  TempSrc: Oral         Complications: No notable events documented.

## 2021-02-02 NOTE — Addendum Note (Signed)
Addendum  created 02/02/21 1507 by Reece Agar, CRNA   Flowsheet accepted, Intraprocedure Flowsheets edited

## 2021-02-02 NOTE — Op Note (Signed)
  PRE-OPERATIVE DIAGNOSIS:  un-needed Port-A-Cath for left breast cancer  POST-OPERATIVE DIAGNOSIS:  Same   PROCEDURE:  Procedure(s):  REMOVAL PORT-A-CATH  SURGEON:  Surgeon(s):  [name], MD  ANESTHESIA:   MAC + local  EBL:   Minimal  SPECIMEN:  None  Complications : none known  Procedure:   Pt was  identified in the holding area and taken to the operating room where she was placed supine on the operating room table.  MAC anesthesia was induced.  The left upper chest was prepped and draped.  The prior incision was anesthetized with local anesthetic.  The incision was opened with a #15 blade.  The subcutaneous tissue was divided with the cautery.  The port was identified and the capsule opened.  The four 2-0 prolene sutures were removed.  The port was then removed and pressure held on the tract.  The catheter appeared intact without evidence of breakage, length was 21 cm.  The wound was inspected for hemostasis, which was achieved with cautery.  The wound was closed with 3-0 vicryl deep dermal interrupted sutures and 4-0 Monocryl running subcuticular suture.  The wound was cleaned, dried, and dressed with dermabond.  The patient was awakened from anesthesia and taken to the PACU in stable condition.  Needle, sponge, and instrument counts are correct.

## 2021-02-02 NOTE — Anesthesia Preprocedure Evaluation (Signed)
Anesthesia Evaluation  Patient identified by MRN, date of birth, ID band Patient awake    Reviewed: Allergy & Precautions, NPO status , Patient's Chart, lab work & pertinent test results  History of Anesthesia Complications Negative for: history of anesthetic complications  Airway Mallampati: III  TM Distance: >3 FB Neck ROM: Full    Dental  (+) Teeth Intact   Pulmonary neg pulmonary ROS,    Pulmonary exam normal        Cardiovascular hypertension, Pt. on medications Normal cardiovascular exam     Neuro/Psych negative neurological ROS     GI/Hepatic Neg liver ROS, GERD  ,  Endo/Other  Hyperthyroidism   Renal/GU negative Renal ROS  negative genitourinary   Musculoskeletal negative musculoskeletal ROS (+)   Abdominal   Peds  Hematology negative hematology ROS (+)   Anesthesia Other Findings   Reproductive/Obstetrics                           Anesthesia Physical Anesthesia Plan  ASA: 2  Anesthesia Plan: MAC   Post-op Pain Management:    Induction: Intravenous  PONV Risk Score and Plan: 2 and Propofol infusion, TIVA and Treatment may vary due to age or medical condition  Airway Management Planned: Natural Airway, Nasal Cannula and Simple Face Mask  Additional Equipment: None  Intra-op Plan:   Post-operative Plan:   Informed Consent: I have reviewed the patients History and Physical, chart, labs and discussed the procedure including the risks, benefits and alternatives for the proposed anesthesia with the patient or authorized representative who has indicated his/her understanding and acceptance.       Plan Discussed with:   Anesthesia Plan Comments:         Anesthesia Quick Evaluation

## 2021-02-03 ENCOUNTER — Encounter (HOSPITAL_COMMUNITY): Payer: Self-pay | Admitting: General Surgery

## 2021-02-03 ENCOUNTER — Encounter: Payer: Self-pay | Admitting: Hematology

## 2021-02-03 NOTE — Progress Notes (Signed)
                                                                                                                                                             Patient Name: Sherry Carrillo MRN: [id number] DOB: 1965-05-23 Referring Physician: Dimas Chyle (Profile Not Attached) Date of Service: 01/27/2021 [facility], [place]                                                        End Of Treatment Note  Diagnoses: C50.412-Malignant neoplasm of upper-outer quadrant of left female breast  Cancer Staging: Stage IIA, pT2N1M0 grade 3, ER/PR positive invasive ductal carcinoma of the left breast.   Intent: Curative  Radiation Treatment Dates: 12/13/2020 through 01/27/2021 Site Technique Total Dose (Gy) Dose per Fx (Gy) Completed Fx Beam Energies  Breast, Left: Breast_Lt 3D 50.4/50.4 1.8 28/28 10X  Breast, Left: Breast_Lt_SCLV 3D 50.4/50.4 1.8 28/28 6X, 10X  Breast, Left: Breast_Lt_Bst 3D 10/10 2 5/5 10X   Narrative: The patient tolerated radiation therapy relatively well. She developed anticipated skin changes in the treatment field.   Plan: The patient will receive a call in about one month from the radiation oncology department. She will continue follow up with Dr. Burr Medico as well.   ________________________________________________    Carola Rhine, [facility]

## 2021-02-20 ENCOUNTER — Ambulatory Visit
Admission: RE | Admit: 2021-02-20 | Discharge: 2021-02-20 | Disposition: A | Discharge status: Home / Self Care | Payer: BC Managed Care – PPO | Source: Ambulatory Visit | Attending: Radiation Oncology | Admitting: Radiation Oncology

## 2021-02-20 DIAGNOSIS — Z170 Estrogen receptor positive status [ER+]: Secondary | ICD-10-CM

## 2021-02-20 NOTE — Progress Notes (Signed)
  Radiation Oncology         [phone] ________________________________  Name: CLASSIE WENG MRN: [id number]  Date of Service: 02/20/2021  DOB: January 10, 1966  Post Treatment Telephone Note  Diagnosis:   Stage IIA, pT2N1M0 grade 3, ER/PR positive invasive ductal carcinoma of the left breast.   Interval Since Last Radiation:  4 weeks   12/13/2020 through 01/27/2021 Site Technique Total Dose (Gy) Dose per Fx (Gy) Completed Fx Beam Energies  Breast, Left: Breast_Lt 3D 50.4/50.4 1.8 28/28 10X  Breast, Left: Breast_Lt_SCLV 3D 50.4/50.4 1.8 28/28 6X, 10X  Breast, Left: Breast_Lt_Bst 3D 10/10 2 5/5 10X   Narrative:  The patient was contacted today for routine follow-up. During treatment she did very well with radiotherapy and did not have significant desquamation.    Impression/Plan: 1. Stage IIA, pT2N1M0 grade 3, ER/PR positive invasive ductal carcinoma of the left breast. I was unable to reach the patient but left a voicemail and on the message, I  discussed that we would be happy to continue to follow her as needed, but she will also continue to follow up with Dr. Burr Medico in medical oncology. She was counseled on skin care as well as measures to avoid sun exposure to this area.  2. Survivorship. We discussed the importance of survivorship evaluation and encouraged her to attend her upcoming visit with that clinic.       Carola Rhine, PAC

## 2021-03-24 ENCOUNTER — Telehealth: Payer: Self-pay | Admitting: *Deleted

## 2021-03-29 ENCOUNTER — Other Ambulatory Visit: Payer: BC Managed Care – PPO

## 2021-03-29 ENCOUNTER — Encounter: Payer: BC Managed Care – PPO | Admitting: Nurse Practitioner

## 2021-03-30 ENCOUNTER — Telehealth: Payer: Self-pay | Admitting: Nurse Practitioner

## 2021-03-30 NOTE — Telephone Encounter (Signed)
Rescheduled missed 01/04 appointment to 01/18, patient has been called and notified.

## 2021-04-10 ENCOUNTER — Telehealth: Payer: Self-pay | Admitting: Nurse Practitioner

## 2021-04-10 NOTE — Telephone Encounter (Signed)
Sch per 1/13 inbasket, left msg °

## 2021-04-12 ENCOUNTER — Other Ambulatory Visit: Payer: BC Managed Care – PPO

## 2021-04-12 ENCOUNTER — Encounter: Payer: BC Managed Care – PPO | Admitting: Adult Health

## 2021-04-12 ENCOUNTER — Encounter: Payer: BC Managed Care – PPO | Admitting: Nurse Practitioner

## 2021-05-03 ENCOUNTER — Other Ambulatory Visit: Payer: Self-pay

## 2021-05-03 ENCOUNTER — Encounter: Payer: Self-pay | Admitting: Nurse Practitioner

## 2021-05-03 ENCOUNTER — Inpatient Hospital Stay (HOSPITAL_BASED_OUTPATIENT_CLINIC_OR_DEPARTMENT_OTHER): Payer: BC Managed Care – PPO | Admitting: Nurse Practitioner

## 2021-05-03 ENCOUNTER — Inpatient Hospital Stay: Payer: BC Managed Care – PPO | Attending: Hematology

## 2021-05-03 DIAGNOSIS — Z923 Personal history of irradiation: Secondary | ICD-10-CM | POA: Insufficient documentation

## 2021-05-03 DIAGNOSIS — Z803 Family history of malignant neoplasm of breast: Secondary | ICD-10-CM | POA: Diagnosis not present

## 2021-05-03 DIAGNOSIS — Z9221 Personal history of antineoplastic chemotherapy: Secondary | ICD-10-CM | POA: Insufficient documentation

## 2021-05-03 DIAGNOSIS — Z79811 Long term (current) use of aromatase inhibitors: Secondary | ICD-10-CM | POA: Insufficient documentation

## 2021-05-03 DIAGNOSIS — I10 Essential (primary) hypertension: Secondary | ICD-10-CM | POA: Insufficient documentation

## 2021-05-03 DIAGNOSIS — R232 Flushing: Secondary | ICD-10-CM | POA: Diagnosis not present

## 2021-05-03 DIAGNOSIS — Z8616 Personal history of COVID-19: Secondary | ICD-10-CM | POA: Diagnosis not present

## 2021-05-03 DIAGNOSIS — K760 Fatty (change of) liver, not elsewhere classified: Secondary | ICD-10-CM | POA: Diagnosis not present

## 2021-05-03 DIAGNOSIS — Z801 Family history of malignant neoplasm of trachea, bronchus and lung: Secondary | ICD-10-CM | POA: Insufficient documentation

## 2021-05-03 DIAGNOSIS — K219 Gastro-esophageal reflux disease without esophagitis: Secondary | ICD-10-CM | POA: Diagnosis not present

## 2021-05-03 DIAGNOSIS — C50412 Malignant neoplasm of upper-outer quadrant of left female breast: Secondary | ICD-10-CM

## 2021-05-03 DIAGNOSIS — I2510 Atherosclerotic heart disease of native coronary artery without angina pectoris: Secondary | ICD-10-CM | POA: Diagnosis not present

## 2021-05-03 DIAGNOSIS — Z170 Estrogen receptor positive status [ER+]: Secondary | ICD-10-CM | POA: Diagnosis not present

## 2021-05-03 DIAGNOSIS — Z79899 Other long term (current) drug therapy: Secondary | ICD-10-CM | POA: Insufficient documentation

## 2021-05-03 DIAGNOSIS — I700 Atherosclerosis of aorta: Secondary | ICD-10-CM | POA: Diagnosis not present

## 2021-05-03 DIAGNOSIS — E042 Nontoxic multinodular goiter: Secondary | ICD-10-CM | POA: Insufficient documentation

## 2021-05-03 LAB — CMP (CANCER CENTER ONLY)
ALT: 16 U/L (ref 0–44)
AST: 14 U/L — ABNORMAL LOW (ref 15–41)
Albumin: 4 g/dL (ref 3.5–5.0)
Alkaline Phosphatase: 73 U/L (ref 38–126)
Anion gap: 8 (ref 5–15)
BUN: 18 mg/dL (ref 6–20)
CO2: 26 mmol/L (ref 22–32)
Calcium: 9.5 mg/dL (ref 8.9–10.3)
Chloride: 106 mmol/L (ref 98–111)
Creatinine: 0.73 mg/dL (ref 0.44–1.00)
GFR, Estimated: >60 mL/min (ref >60)
Glucose, Bld: 103 mg/dL — ABNORMAL HIGH (ref 70–99)
Potassium: 4 mmol/L (ref 3.5–5.1)
Sodium: 140 mmol/L (ref 135–145)
Total Bilirubin: 0.4 mg/dL (ref 0.3–1.2)
Total Protein: 6.8 g/dL (ref 6.5–8.1)

## 2021-05-03 LAB — CBC WITH DIFFERENTIAL (CANCER CENTER ONLY)
Abs Immature Granulocytes: 0.02 10*3/uL (ref 0.00–0.07)
Basophils Absolute: 0 10*3/uL (ref 0.0–0.1)
Basophils Relative: 1 %
Eosinophils Absolute: 0.1 10*3/uL (ref 0.0–0.5)
Eosinophils Relative: 2 %
HCT: 38.1 % (ref 36.0–46.0)
Hemoglobin: 12.8 g/dL (ref 12.0–15.0)
Immature Granulocytes: 0 %
Lymphocytes Relative: 22 %
Lymphs Abs: 1.5 10*3/uL (ref 0.7–4.0)
MCH: 28.8 pg (ref 26.0–34.0)
MCHC: 33.6 g/dL (ref 30.0–36.0)
MCV: 85.6 fL (ref 80.0–100.0)
Monocytes Absolute: 0.5 10*3/uL (ref 0.1–1.0)
Monocytes Relative: 7 %
Neutro Abs: 4.6 10*3/uL (ref 1.7–7.7)
Neutrophils Relative %: 68 %
Platelet Count: 268 10*3/uL (ref 150–400)
RBC: 4.45 MIL/uL (ref 3.87–5.11)
RDW: 12.8 % (ref 11.5–15.5)
WBC Count: 6.6 10*3/uL (ref 4.0–10.5)
nRBC: 0 % (ref 0.0–0.2)

## 2021-05-03 NOTE — Progress Notes (Signed)
CLINIC: Survivorship   Patient Care Team: Vivi Barrack, MD as PCP - General (Family Medicine) Mauro Kaufmann, RN as Oncology Nurse Navigator Rockwell Germany, RN as Oncology Nurse Navigator [name], MD as Consulting Physician (General Surgery) Kyung Rudd, MD as Consulting Physician ([place]) Truitt Merle, MD as Consulting Physician (Hematology) Alla Feeling, NP as Nurse Practitioner (Nurse Practitioner) Earnstine Regal, PA-C as Physician Assistant (Obstetrics and Gynecology)   I connected with Sherry Carrillo on 05/03/21 at 12:30 PM EST by telephone and verified that I am speaking with the correct person using two identifiers.  I discussed the limitations, risks, security and privacy concerns of performing an evaluation and management service by telephone and the availability of in person appointments. I also discussed with the patient that there may be a patient responsible charge related to this service. The patient expressed understanding and agreed to proceed.   BRIEF ONCOLOGIC HISTORY:  Oncology History Overview Note  Cancer Staging Malignant neoplasm of upper-outer quadrant of left breast, estrogen receptor positive ([place]) Staging form: Breast, AJCC 8th Edition - Clinical stage from 05/31/2020: Stage IIA (cT2, cN0, cM0, G3, ER+, PR+, HER2-) - Signed by Truitt Merle, MD on 06/01/2020 Stage prefix: Initial diagnosis    Malignant neoplasm of upper-outer quadrant of left breast, estrogen receptor positive ([place])  05/17/2020 Mammogram   IMPRESSION: 1. Highly suspicious 2.3 cm mass involving the UPPER OUTER QUADRANT of the LEFT breast at the 1 o'clock position approximately 8 cm from the nipple. 2. No pathologic LEFT axillary lymphadenopathy. 3. No mammographic evidence of malignancy involving the RIGHT breast.   05/17/2020 Initial Biopsy   Diagnosis Breast, left, needle core biopsy, 1 o'clock - INVASIVE MAMMARY CARCINOMA.  Microscopic Comment The carcinoma appears grade  3. The greatest linear extent of tumor in any one core is 10 mm. E-cadherin will be reported separately. Ancillary studies will be reported separately. Results reported to [facility] on 05/18/2020. Dr. Tresa Moore reviewed the case. ADDENDUM: Immunohistochemistry for E-cadherin is positive supporting a ductal phenotype.    05/17/2020 Receptors her2   PROGNOSTIC INDICATORS Results: IMMUNOHISTOCHEMICAL AND MORPHOMETRIC ANALYSIS PERFORMED MANUALLY The tumor cells are NEGATIVE for Her2 (1+). Estrogen Receptor: 90%, POSITIVE, STRONG STAINING INTENSITY Progesterone Receptor: 60%, POSITIVE, STRONG STAINING INTENSITY Proliferation Marker Ki67: 70%   05/31/2020 Initial Diagnosis   Malignant neoplasm of upper-outer quadrant of left breast, estrogen receptor positive ([place])   05/31/2020 Cancer Staging   Staging form: Breast, AJCC 8th Edition - Clinical stage from 05/31/2020: Stage IIA (cT2, cN0, cM0, G3, ER+, PR+, HER2-) - Signed by Truitt Merle, MD on 06/01/2020 Stage prefix: Initial diagnosis    06/03/2020 Cancer Staging   Staging form: Breast, AJCC 8th Edition - Pathologic stage from 06/03/2020: Stage IIA (pT2, pN1a, cM0, G3, ER+, PR+, HER2-) - Signed by Truitt Merle, MD on 06/29/2020 Stage prefix: Initial diagnosis Multigene prognostic tests performed: MammaPrint Histologic grading system: 3 grade system    06/03/2020 Surgery   Left breast lumpectomy with left axillary sentinel lymph node  biopsies  A. 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  - High grade ductal carcinoma in situ.  - Invasive carcinoma is at the inferior margin focally and <0.1 cm from  the anterior margin focally.  - In situ carcinoma is <0.1 cm from the anterior margin focally.  - Biopsy site.  - See oncology table.   B. LYMPH NODE, LEFT AXILLARY, #1, SENTINEL, EXCISION:  - Metastatic carcinoma in  one of one lymph nodes (1/1).   C. LYMPH NODE, LEFT AXILLARY, #2, SENTINEL,  EXCISION:  - One of one lymph nodes negative for carcinoma (0/1).   06/03/2020 Miscellaneous   Mammaprint - HIGH RISK   07/20/2020 Imaging   CT CAP  IMPRESSION: 1. Surgical clips in the area of the LEFT breast laterally along the margin of water density well-circumscribed area that measures approximately 4.2 x 3.7 cm. This is likely a postoperative seroma. Correlate with any pain or developing symptoms that would suggest infection. No gas or secondary findings to suggest this at this time. 2. No evidence of metastatic disease in the chest, abdomen or pelvis. 3. Hepatic steatosis without focal lesion. 4. Calcified coronary artery disease. 5. Aortic atherosclerosis. 6. Mildly nodular thyroid. 7. Lesion in the RIGHT thyroid measuring up to 1.8 cm. Recommend thyroid US (ref: J Am Coll Radiol. 2015 Feb;12(2): 143-50).     07/20/2020 Imaging   Bone Scan  IMPRESSION: Signs of spondylosis of the thoracic and lumbar spine. No scintigraphic evidence of metastatic disease.     07/27/2020 Surgery   RE-EXCISION OF LEFT BREAST LUMPECTOMY and PAC placement by Dr Barry Dienes  FINAL MICROSCOPIC DIAGNOSIS:   A. BREAST, LEFT ANTERIOR MARGIN, EXCISION:  - Prior procedure site changes.  No carcinoma identified.   B. BREAST, LEFT INFERIOR MARGIN, EXCISION:  - Prior procedure site changes.  No carcinoma identified.    08/18/2020 - 10/20/2020 Chemotherapy   docetaxel and Cytoxan (TC) Q3weeks for 4 cycles.     12/13/2020 - 01/27/2021 Radiation Therapy   Site Technique Total Dose (Gy) Dose per Fx (Gy) Completed Fx Beam Energies  Breast, Left: Breast_Lt 3D 50.4/50.4 1.8 28/28 10X  Breast, Left: Breast_Lt_SCLV 3D 50.4/50.4 1.8 28/28 6X, 10X  Breast, Left: Breast_Lt_Bst 3D 10/10 2 5/5 10X      02/23/2021 -  Anti-estrogen oral therapy   Anastrozole   05/03/2021 Survivorship   SCP delivered virtually by Cira Rue, NP     INTERVAL HISTORY:  Sherry Carrillo to review her survivorship care plan detailing her  treatment course for breast cancer, as well as monitoring long-term side effects of that treatment, education regarding health maintenance, screening, and overall wellness and health promotion.     Overall, Sherry Carrillo reports feeling well overall. Denies residual chemo side effects. She fell 30 years ago and hurt tailbone. When she started anastrozole this pain came back, but not every day, and not today. Bones ache more in the morning, improves throughout the day, she is functional and continues working. She has hot flashes and night sweats. Due to not wanting to take a lot of medicine, she hasn't tried gabapentin. Instead she is using more "natural" remedies such as black cohosh, which helps. She doesn't get a lot of good sleep, melatonin didn't help, now taking "sleep" pill from [facility] store plus 3 melatonin. Spouse tells her she snores. She is tolerating side effects. She is trying to lose weight again, previously went to weight loss clinic and may consider phentermine again.      REVIEW OF SYSTEMS:  Review of Systems - Oncology Breast: Denies any new nodularity, masses, tenderness, nipple changes, or nipple discharge.      ONCOLOGY TREATMENT TEAM:  1. Surgeon:  Dr. Barry Dienes at [facility] 2. Medical Oncologist: Dr. Burr Medico  3. Radiation Oncologist: Dr. Lisbeth Renshaw    PAST MEDICAL/SURGICAL HISTORY:  Past Medical History:  Diagnosis Date   Abnormal pap 2009   PT HAD SURGERY BUT DON'T KNOW WHAT  TYPE   Breast cancer ([place]) 04/27/2020   COVID 2021   GERD (gastroesophageal reflux disease)    Headache(784.0)    otc meds prn   Hypertension    Hyperthyroidism    SVD (spontaneous vaginal delivery)    x 2   Past Surgical History:  Procedure Laterality Date   BREAST LUMPECTOMY WITH RADIOACTIVE SEED AND SENTINEL LYMPH NODE BIOPSY Left 06/03/2020   Procedure: RADIOACTIVE SEED GUIDED LEFT BREAST LUMPECTOMY, LEFT AXILLARY SENTINEL LYMPH NODE BIOPSY;  Surgeon: [name], MD;  Location: [place];   Service: General;  Laterality: Left;   FOOT SURGERY     left   PORT-A-CATH REMOVAL N/A 02/02/2021   Procedure: REMOVAL PORT-A-CATH;  Surgeon: [name], MD;  Location: [place];  Service: General;  Laterality: N/A;   PORTACATH PLACEMENT Left 07/27/2020   Procedure: INSERTION PORT-A-CATH;  Surgeon: [name], MD;  Location: [place];  Service: General;  Laterality: Left;   RE-EXCISION OF BREAST LUMPECTOMY Left 07/27/2020   Procedure: RE-EXCISION OF LEFT BREAST LUMPECTOMY;  Surgeon: [name], MD;  Location: [place];  Service: General;  Laterality: Left;   TUBAL LIGATION     WISDOM TOOTH EXTRACTION       ALLERGIES:  Allergies  Allergen Reactions   Sulfa Antibiotics Nausea Only     CURRENT MEDICATIONS:  Outpatient Encounter Medications as of 05/03/2021  Medication Sig Note   acetaminophen (TYLENOL) 325 MG tablet Take 650 mg by mouth every 6 (six) hours as needed.    anastrozole (ARIMIDEX) 1 MG tablet Take 1 tablet (1 mg total) by mouth daily. 01/31/2021: Has not started this medication yet   lisinopril-hydrochlorothiazide (ZESTORETIC) 20-12.5 MG tablet Take 1 tablet by mouth daily.    oxyCODONE (OXY IR/ROXICODONE) 5 MG immediate release tablet Take 1 tablet (5 mg total) by mouth every 6 (six) hours as needed for severe pain.    Facility-Administered Encounter Medications as of 05/03/2021  Medication   sodium chloride flush (NS) 0.9 % injection 10 mL     ONCOLOGIC FAMILY HISTORY:  Family History  Problem Relation Age of Onset   Cancer Mother        LUNG   Hypertension Mother    Diabetes Mother    Heart disease Father    Diabetes Maternal Grandmother    Heart disease Paternal Grandmother    Heart disease Paternal Grandfather    Thyroid disease Sister    Diabetes Brother    Cancer Maternal Aunt 65       breast cancer      GENETIC COUNSELING/TESTING: declined  SOCIAL HISTORY:  Social History   Socioeconomic History   Marital status: Married    Spouse name: Not on  file   Number of children: 2   Years of education: 14   Highest education level: Not on file  Occupational History   Not on file  Tobacco Use   Smoking status: Never   Smokeless tobacco: Never  Vaping Use   Vaping Use: Never used  Substance and Sexual Activity   Alcohol use: No   Drug use: No   Sexual activity: Yes    Birth control/protection: Surgical    Comment: BTL  Other Topics Concern   Not on file  Social History Narrative   Fun: [employer]   Denies abuse and feels safe at home.    Social Determinants of Health   Financial Resource Strain: Not on file  Food Insecurity: Not on file  Transportation Needs: Not on file  Physical  Activity: Not on file  Stress: Not on file  Social Connections: Not on file  Intimate Partner Violence: Not on file     OBSERVATIONS/OBJECTIVE:  Pt appears well over the phone. Voice is strong, speech is clear. Mood/affect appear normal for situation. No cough or conversational dyspnea   LABORATORY DATA:  CBC Latest Ref Rng & Units 05/03/2021 02/02/2021 10/20/2020  WBC 4.0 - 10.5 K/uL 6.6 4.0 9.5  Hemoglobin 12.0 - 15.0 g/dL 12.8 13.4 10.7(L)  Hematocrit 36.0 - 46.0 % 38.1 41.8 33.0(L)  Platelets 150 - 400 K/uL 268 172 383    CMP Latest Ref Rng & Units 05/03/2021 02/02/2021 10/20/2020  Glucose 70 - 99 mg/dL 103(H) 112(H) 171(H)  BUN 6 - 20 mg/dL _0 Creatinine 0.44 - 1.00 mg/dL 0.73 0.54 0.75  Sodium 135 - 145 mmol/L 140 138 139  Potassium 3.5 - 5.1 mmol/L 4.0 3.9 4.3  Chloride 98 - 111 mmol/L 106 105 107  CO2 22 - 32 mmol/L _1 Calcium 8.9 - 10.3 mg/dL 9.5 9.1 9.3  Total Protein 6.5 - 8.1 g/dL 6.8 - 6.4(L)  Total Bilirubin 0.3 - 1.2 mg/dL 0.4 - 0.2(L)  Alkaline Phos 38 - 126 U/L 73 - 91  AST 15 - 41 U/L 14(L) - 16  ALT 0 - 44 U/L 16 - 15     DIAGNOSTIC IMAGING:  None for this visit.      ASSESSMENT AND PLAN:  Ms.. Carrillo is a pleasant 56 y.o. female with Stage IIA left breast invasive ductal carcinoma, ER+/PR+/HER2-,  diagnosed in 04/2020, treated with lumpectomy, adjuvant chemotherapy, radiation therapy, and anti-estrogen therapy with Anastrozole beginning in 02/2021.  She presents to the Survivorship Clinic for our initial meeting and routine follow-up post-completion of treatment for breast cancer.    1. Stage IIA left breast cancer:  Sherry Carrillo has recovered well from definitive treatment for breast cancer. She will follow-up with her medical oncologist, Dr. Burr Medico in 07/2021 with history and physical exam per surveillance protocol.  She will continue her anti-estrogen therapy with Anastrozole. Thus far, she is tolerating well, with bone aches, hot flashes, and difficulty sleeping. She takes black cohosh and sleep aid, I reviewed there is no DDI with anastrozole and black cohosh. She will find out active ingredient in sleep aid and let us know. She has 2 trips coming up to [place] in March and a cruise to [place] in April. If she has severe bone pain or hot flashes it's ok to take AI every other day/MWF or hold entirely solely for the trip. She understands to resume daily dosing promptly upon her return. She was instructed to make Dr. Burr Medico or myself aware if she begins to experience any worsening side effects of the medication and I could see her back in clinic to help manage those side effects, as needed. Her mammogram is due 04/2021; orders placed today. Today, a comprehensive survivorship care plan and treatment summary was reviewed with the patient today detailing her breast cancer diagnosis, treatment course, potential late/long-term effects of treatment, appropriate follow-up care with recommendations for the future, and patient education resources.  A copy of this summary, along with a letter will be sent to the patients primary care provider via mail/fax/In Basket message after todays visit.    2. Bone health:  Given Sherry Carrillo age/history of breast cancer and her current treatment regimen including anti-estrogen therapy  with anastrozole, she is at risk for bone demineralization. We will obtain baseline  DEXA with mammogram. In the meantime she will continue calcium and weight bearing exercise. She agrees to start vitamin D. She was given education on specific activities to promote bone health.  3. Cancer screening:  Due to Sherry Carrillo history and her age, she should receive screening for skin cancers, colon cancer, and gynecologic cancers. She has contacted her ob/gyn for f/up and pap smear. She will f/up with PCP on cologuard testing which was given to her in the past. I recommend colonoscopy but she will f/up with PCP.  The information and recommendations are listed on the patient's comprehensive care plan/treatment summary and were reviewed in detail with the patient.    4. Health maintenance and wellness promotion: Sherry Carrillo was encouraged to consume 5-7 servings of fruits and vegetables per day and engage in moderate to vigorous exercise for 30 minutes per day most days of the week. She was instructed to limit her alcohol consumption and continue to abstain from tobacco use.    5. Support services/counseling: It is not uncommon for this period of the patient's cancer care trajectory to be one of many emotions and stressors.  We discussed how this can be increasingly difficult during the times of quarantine and social distancing due to the COVID-19 pandemic.   She was given information regarding our available services and encouraged to contact me with any questions or for help enrolling in any of our support group/programs.    Follow up instructions:  -Mammogram and DEXA due 04/2021, orders placed  -Return to cancer center 07/2021 -Continue anastrozole  -Follow up with surgery as indicated -Pt will let us know active ingredient in sleep aid to check DDIs with anastrozole  She is welcome to return back to the Survivorship Clinic at any time; no additional follow-up needed at this time. Consider referral back to  survivorship as a long-term survivor for continued surveillance.  Orders Placed This Encounter  Procedures   MM DIAG BREAST TOMO BILATERAL    Standing Status:   Future    Standing Expiration Date:   05/03/2022    Order Specific Question:   Reason for Exam (SYMPTOM  OR DIAGNOSIS REQUIRED)    Answer:   h/o stage IIA left breast IDC s/p lump/re-excision, chemo, RT, on AI    Order Specific Question:   Is the patient pregnant?    Answer:   No    Order Specific Question:   Preferred imaging location?    Answer:   [facility]   DG Bone Density    Standing Status:   Future    Standing Expiration Date:   05/03/2022    Order Specific Question:   Reason for Exam (SYMPTOM  OR DIAGNOSIS REQUIRED)    Answer:   baseline on AI    Order Specific Question:   Is the patient pregnant?    Answer:   No    Order Specific Question:   Preferred imaging location?    Answer:   [facility]    The patient was provided an opportunity to ask questions and all were answered. The patient agreed with the plan and demonstrated an understanding of the instructions.  The patient was advised to call back or seek an in-person evaluation if the symptoms worsen or if the condition fails to improve as anticipated. I provided 45 minutes of non-face-to-face time during this encounter.   Alla Feeling, NP

## 2021-05-04 ENCOUNTER — Telehealth: Payer: Self-pay | Admitting: Hematology

## 2021-05-04 NOTE — Telephone Encounter (Signed)
Sch per 2/8 inbasket, provider req to r/s, pt aware

## 2021-05-31 ENCOUNTER — Ambulatory Visit
Admission: RE | Admit: 2021-05-31 | Discharge: 2021-05-31 | Disposition: A | Discharge status: Home / Self Care | Payer: BC Managed Care – PPO | Source: Ambulatory Visit | Attending: Nurse Practitioner | Admitting: Nurse Practitioner

## 2021-05-31 DIAGNOSIS — Z170 Estrogen receptor positive status [ER+]: Secondary | ICD-10-CM

## 2021-05-31 DIAGNOSIS — C50412 Malignant neoplasm of upper-outer quadrant of left female breast: Secondary | ICD-10-CM

## 2021-05-31 HISTORY — DX: Personal history of irradiation: Z92.3

## 2021-05-31 HISTORY — DX: Personal history of antineoplastic chemotherapy: Z92.21

## 2021-06-01 ENCOUNTER — Telehealth: Payer: Self-pay

## 2021-06-01 NOTE — Telephone Encounter (Signed)
Spoke with pt via telephone regarding her mammogram results.  Pt stated she was already aware of her mammogram results but stated she did not get her bone density test done d/t a delay at the facility where she had her mammogram scheduled.  Pt stated the mammogram & the bone density was scheduled on the same day but the facility was running behind that day on doing the bone density test and she could not stay to have it done.  Pt stated she will contact them to have the bone density scan rescheduled.  Pt had no further questions or concerns at this time. ?

## 2021-06-20 ENCOUNTER — Ambulatory Visit: Payer: BC Managed Care – PPO | Admitting: Family Medicine

## 2021-06-20 ENCOUNTER — Encounter: Payer: Self-pay | Admitting: Family Medicine

## 2021-06-20 ENCOUNTER — Ambulatory Visit (INDEPENDENT_AMBULATORY_CARE_PROVIDER_SITE_OTHER): Payer: BC Managed Care – PPO | Admitting: Family Medicine

## 2021-06-20 VITALS — BP 141/86 | HR 85 | Temp 98.2°F | Ht 60.0 in | Wt 167.2 lb

## 2021-06-20 DIAGNOSIS — I10 Essential (primary) hypertension: Secondary | ICD-10-CM | POA: Diagnosis not present

## 2021-06-20 DIAGNOSIS — R300 Dysuria: Secondary | ICD-10-CM

## 2021-06-20 DIAGNOSIS — K219 Gastro-esophageal reflux disease without esophagitis: Secondary | ICD-10-CM | POA: Diagnosis not present

## 2021-06-20 LAB — POCT URINALYSIS DIPSTICK
Bilirubin, UA: NEGATIVE
Blood, UA: POSITIVE
Glucose, UA: NEGATIVE
Ketones, UA: NEGATIVE
Nitrite, UA: NEGATIVE
Protein, UA: POSITIVE — AB
Spec Grav, UA: 1.015 (ref 1.010–1.025)
Urobilinogen, UA: 0.2 E.U./dL
pH, UA: 6 (ref 5.0–8.0)

## 2021-06-20 MED ORDER — PANTOPRAZOLE SODIUM 40 MG PO TBEC: 40.0000 mg | DELAYED_RELEASE_TABLET | Freq: Every day | ORAL | 3 refills | Status: DC

## 2021-06-20 MED ORDER — NITROFURANTOIN MONOHYD MACRO 100 MG PO CAPS: 100.0000 mg | ORAL_CAPSULE | Freq: Two times a day (BID) | ORAL | 0 refills | Status: DC

## 2021-06-20 MED ORDER — SODIUM CHLORIDE 0.9% FLUSH: 10.0000 mL | Freq: Once | INTRAVENOUS | Status: DC

## 2021-06-20 NOTE — Patient Instructions (Signed)
It was nice to see you! ? ?You have a urinary tract infection. Please start the antibiotic. ? ?We will check a urine culture to make sure you do not have a resistant bacteria. We will call you if we need to change your medications.  ? ?Please make sure you are drinking plenty of fluids over the next few days. ? ?If your symptoms do not improve over the next 5-7 days, or if they worsen, please let us know. Please also let us know if you have worsening back pain, fevers, chills, or body aches.  ? ?Please start the acid blocker medication as well.  Let me know if not improving. ? ?Take care, ?Dr [name]  ?

## 2021-06-20 NOTE — Assessment & Plan Note (Signed)
Slightly above goal today though typically well controlled with on lisinopril-HCTZ 20-12.5 daily.  She does not take her blood pressure medication today.  Discussed home blood pressure monitoring.  She will let me know persistently elevated. ?

## 2021-06-20 NOTE — Assessment & Plan Note (Signed)
Uncontrolled.  Possibly worsened due to chemotherapy.  We will start Protonix 40 mg daily.  She will let me know if not improving in the next few weeks. She was given a dose of mylanta in the office today with improvement in symptoms.  ?

## 2021-06-20 NOTE — Progress Notes (Signed)
? ?  Sherry Carrillo is a 56 y.o. female who presents today for an office visit. ? ?Assessment/Plan:  ?New/Acute Problems: ?UTI ?No red flags or signs systemic illness.  History and UA consistent with UTI.  We will start empiric Macrobid.  Check urine culture.  Encouraged hydration.  Discussed reasons to return to care. ? ?Chronic Problems Addressed Today: ?Essential hypertension ?Slightly above goal today though typically well controlled with on lisinopril-HCTZ 20-12.5 daily.  She does not take her blood pressure medication today.  Discussed home blood pressure monitoring.  She will let me know persistently elevated. ? ?GERD (gastroesophageal reflux disease) ?Uncontrolled.  Possibly worsened due to chemotherapy.  We will start Protonix 40 mg daily.  She will let me know if not improving in the next few weeks. She was given a dose of mylanta in the office today with improvement in symptoms.  ? ? ?  ?Subjective:  ?HPI: ? ?Patient here with concern for possible UTI. Her symptoms  with urinary frequency and dysuria. She has tried Azo OTC medication with some relief. She notes she recently took trip to [place]. Symptoms started 5 or 6 days ago while in [place].  She notes she has had increased pain while she was at [place] for a dayNo fever or chills. No hematuria.  ? ?She is also having issue with worsening reflux. Started few weeks ago. Symptoms seems to be getting worse She has been feeling sick due to this. Has had pain when laying down or sitting. She notes she has tried any medication for this issue. She was on Protonix in the past.  ? ?   ?  ?Objective:  ?Physical Exam: ?BP (!) 141/86 (BP Location: Right Arm)   Pulse 85   Temp 98.2 ?F (36.8 ?C) (Temporal)   Ht 5' (1.524 m)   Wt 167 lb 3.2 oz (75.8 kg)   SpO2 99%   BMI 32.65 kg/m?   ?Gen: No acute distress, resting comfortably ?CV: Regular rate and rhythm with no murmurs appreciated ?Pulm: Normal work of breathing, clear to auscultation bilaterally with no crackles,  wheezes, or rhonchi ?Neuro: Grossly normal, moves all extremities ?Psych: Normal affect and thought content ? ?   ? ?I,Savera Zaman,acting as a Education administrator for Dimas Chyle, MD.,have documented all relevant documentation on the behalf of Dimas Chyle, MD,as directed by  Dimas Chyle, MD while in the presence of Dimas Chyle, MD.  ? ?I, Dimas Chyle, MD, have reviewed all documentation for this visit. The documentation on 06/20/21 for the exam, diagnosis, procedures, and orders are all accurate and complete. ? ?[name]. [name], MD ?06/20/2021 10:11 AM  ? ?

## 2021-06-21 LAB — URINE CULTURE
MICRO NUMBER:: 13189606
SPECIMEN QUALITY:: ADEQUATE

## 2021-06-23 ENCOUNTER — Telehealth: Payer: Self-pay | Admitting: Family Medicine

## 2021-06-23 NOTE — Telephone Encounter (Signed)
See lab note.  

## 2021-06-23 NOTE — Telephone Encounter (Signed)
Pt returning a call to our office. She would like a call back but please call her work number at [phone] ?

## 2021-06-23 NOTE — Progress Notes (Signed)
Please inform patient of the following: ? ?Urine culture is inconclusive. Would like for her to let us know if her symptoms are not improving. ? ?[name]. [name], MD ?06/23/2021 10:24 AM  ?

## 2021-06-28 ENCOUNTER — Other Ambulatory Visit: Payer: BC Managed Care – PPO

## 2021-06-28 ENCOUNTER — Ambulatory Visit: Payer: BC Managed Care – PPO | Admitting: Hematology

## 2021-06-28 ENCOUNTER — Telehealth: Payer: Self-pay

## 2021-06-28 NOTE — Telephone Encounter (Signed)
This nurse received a message from RX benefits related to this patient not having a prescription for her Anastrozole on file.  This nurse spoke with representative and was made aware that the medication had submitted for prior authorization and they have since been made aware that prior authorization  was not needed and the medication has been filled.  No further questions or concerns at this time.   ?

## 2021-07-04 ENCOUNTER — Other Ambulatory Visit: Payer: Self-pay

## 2021-07-04 ENCOUNTER — Telehealth: Payer: Self-pay

## 2021-07-04 MED ORDER — ANASTROZOLE 1 MG PO TABS: 1.0000 mg | ORAL_TABLET | Freq: Every day | ORAL | 3 refills | Status: DC

## 2021-07-04 NOTE — Telephone Encounter (Signed)
Pt called stating she is out of Anastrozole and has contacted [facility] regarding scheduling shipment.  Pt stated [facility] informed her that they cannot send her the medication d/t they need the provider's office to contact them regarding need for medication.  Asked pt did they tell her they need a prior authorization.  Pt responded as she is unsure.  Pt stated she was awarded a 1 time emergent 30 day supply from [facility] for Anastrozole to allow time for her to get the prescription straighten out with her provider & [facility].  Spoke with Hoy Register, RN and Alferd Apa, LPN who have both been working on this prior to the patient calling.  Starla Link stated she spoke with [facility] regarding Shipment and prior authorization which was approved by the pt's insurance last week.  Joy stated she has faxed [facility] the refill prescription request that [facility] faxed to Dr. Burr Medico twice.  This RN also sent the electronic prescription request to [facility] today 07/04/2021.  Starla Link stated she will contact [facility] on 07/05/2021.  This RN contacted the pt via telephone to let her know that Starla Link will be talking to [facility] on 07/05/2021 regarding the Anastrozole.  ?

## 2021-07-24 ENCOUNTER — Other Ambulatory Visit: Payer: Self-pay | Admitting: Hematology

## 2021-07-25 ENCOUNTER — Ambulatory Visit (INDEPENDENT_AMBULATORY_CARE_PROVIDER_SITE_OTHER): Payer: BC Managed Care – PPO | Admitting: Family Medicine

## 2021-07-25 ENCOUNTER — Encounter: Payer: Self-pay | Admitting: Family Medicine

## 2021-07-25 VITALS — BP 128/83 | HR 72 | Temp 98.7°F | Ht 60.0 in | Wt 167.8 lb

## 2021-07-25 DIAGNOSIS — R519 Headache, unspecified: Secondary | ICD-10-CM | POA: Diagnosis not present

## 2021-07-25 DIAGNOSIS — I10 Essential (primary) hypertension: Secondary | ICD-10-CM | POA: Diagnosis not present

## 2021-07-25 LAB — COMPREHENSIVE METABOLIC PANEL
ALT: 12 U/L (ref 0–35)
AST: 15 U/L (ref 0–37)
Albumin: 4 g/dL (ref 3.5–5.2)
Alkaline Phosphatase: 78 U/L (ref 39–117)
BUN: 15 mg/dL (ref 6–23)
CO2: 28 mEq/L (ref 19–32)
Calcium: 9.4 mg/dL (ref 8.4–10.5)
Chloride: 104 mEq/L (ref 96–112)
Creatinine, Ser: 0.88 mg/dL (ref 0.40–1.20)
GFR: 73.63 mL/min (ref >60.00)
Glucose, Bld: 113 mg/dL — ABNORMAL HIGH (ref 70–99)
Potassium: 3.8 mEq/L (ref 3.5–5.1)
Sodium: 138 mEq/L (ref 135–145)
Total Bilirubin: 0.3 mg/dL (ref 0.2–1.2)
Total Protein: 6.9 g/dL (ref 6.0–8.3)

## 2021-07-25 LAB — CBC
HCT: 40 % (ref 36.0–46.0)
Hemoglobin: 13.1 g/dL (ref 12.0–15.0)
MCHC: 32.6 g/dL (ref 30.0–36.0)
MCV: 85.4 fl (ref 78.0–100.0)
Platelets: 228 10*3/uL (ref 150.0–400.0)
RBC: 4.68 Mil/uL (ref 3.87–5.11)
RDW: 13.1 % (ref 11.5–15.5)
WBC: 4.8 10*3/uL (ref 4.0–10.5)

## 2021-07-25 LAB — TSH: TSH: 0.32 u[IU]/mL — ABNORMAL LOW (ref 0.35–5.50)

## 2021-07-25 MED ORDER — KETOROLAC TROMETHAMINE 60 MG/2ML IM SOLN
60.0000 mg | Freq: Once | INTRAMUSCULAR | Status: AC
  Administered 2021-07-25: 60 mg via INTRAMUSCULAR

## 2021-07-25 MED ORDER — SCOPOLAMINE 1 MG/3DAYS TD PT72: 1.0000 | MEDICATED_PATCH | TRANSDERMAL | 0 refills | Status: DC

## 2021-07-25 MED ORDER — METHYLPREDNISOLONE ACETATE 80 MG/ML IJ SUSP
80.0000 mg | Freq: Once | INTRAMUSCULAR | Status: AC
  Administered 2021-07-25: 80 mg via INTRAMUSCULAR

## 2021-07-25 MED ORDER — PROCHLORPERAZINE MALEATE 10 MG PO TABS: 10.0000 mg | ORAL_TABLET | Freq: Four times a day (QID) | ORAL | 0 refills | Status: DC | PRN

## 2021-07-25 NOTE — Assessment & Plan Note (Signed)
Acute flare as above.  Likely multifactorial.  If this continues to be recurrent issue will need referral to neurology. ?

## 2021-07-25 NOTE — Assessment & Plan Note (Signed)
At goal today on lisinopril-HCTZ 20-12.'5mg'$  once daily.  ?

## 2021-07-25 NOTE — Patient Instructions (Signed)
It was very nice to see you today! ? ?We will give you 2 injections today to help with your migraine.  Please take the Compazine with Benadryl when you get home.  This can potentially make you sleepy so please do not plan on driving after you take it.  We will check blood work today. ? ?Please send me a message or let me know how you are doing tomorrow. ? ?Take care, ?Dr [name] ? ?PLEASE NOTE: ? ?If you had any lab tests please let us know if you have not heard back within a few days. You may see your results on mychart before we have a chance to review them but we will give you a call once they are reviewed by [place]. If we ordered any referrals today, please let us know if you have not heard from their office within the next week.  ? ?Please try these tips to maintain a healthy lifestyle: ? ?Eat at least 3 REAL meals and 1-2 snacks per day.  Aim for no more than 5 hours between eating.  If you eat breakfast, please do so within one hour of getting up.  ? ?Each meal should contain half fruits/vegetables, one quarter protein, and one quarter carbs (no bigger than a computer mouse) ? ?Cut down on sweet beverages. This includes juice, soda, and sweet tea.  ? ?Drink at least 1 glass of water with each meal and aim for at least 8 glasses per day ? ?Exercise at least 150 minutes every week.   ?

## 2021-07-25 NOTE — Progress Notes (Signed)
? ?Sherry Carrillo is a 56 y.o. female who presents today for an office visit. ? ?Assessment/Plan:  ?New/Acute Problems: ?Headache ?Likely multifactorial.  She does have quite a bit of underlying neck pain which is probably the main contributor though does have a known history of migraine disorder and headache disorder.  She also recently started anastrozole for her history of breast cancer which is likely contributing as well.  We will treat with headache/migraine cocktail today with 60 mg of Toradol, 80 mg of Depo-Medrol.  We will also send in a prescription for Compazine 10 mg 3 times daily as needed that she will take when she gets home with Benadryl.  We will check labs today including CBC, c-Met, and TSH.  She does have a reassuring neurologic exam today without any focal deficits-do not think we need to get any imaging at this point.  She will follow-up with me either via telephone call or MyChart message tomorrow.  If symptoms are still not improving will need to get imaging at that point.  We discussed reasons to return to care or seek emergent care. ? ?Neck Pain ?Likely muscular strain though probably has little bit of underlying osteoarthritis contributing as well.  No red flags.  It is reassuring that symptoms have improved somewhat with Advil.  We will be doing a dose of Depo-Medrol and Toradol as above.  She will let me know if not improving. ? ?History of Motion Sickness ?We will refill scopolamine patch. ? ?Chronic Problems Addressed Today: ?Essential hypertension ?At goal today on lisinopril-HCTZ 20-12.31m once daily.  ? ?Headache disorder ?Acute flare as above.  Likely multifactorial.  If this continues to be recurrent issue will need referral to neurology. ? ? ?  ?Subjective:  ?HPI: ? ?Patient here with headache and neck pain for the last week.  Pain started as pain in the back of her neck.  This progressed to a constant dull headache in the back of her head that radiates towards the front.  She does  have a history of frequent headaches and headache disorder.  She also has a history of migraines.  Her current headache does not feel like a typical migraine for her.  She started taking liquid Advil which usually helps with her headaches.  This helped alleviate her neck pain but did not help much with her headache.  She still has a lot of neck pain that is worse with movement and rotation.  She has swallowing nausea.  No vision changes.  No weakness or numbness.  No vomiting.  She was worried about elevated blood pressure readings however her blood pressure has been relatively well controlled recently.  Pain comes and goes.  Worse throughout the day. ? ? ?   ?  ?Objective:  ?Physical Exam: ?BP 128/83   Pulse 72   Temp 98.7 ?F (37.1 ?C) (Temporal)   Ht 5' (1.524 m)   Wt 167 lb 12.8 oz (76.1 kg)   SpO2 98%   BMI 32.77 kg/m?   ?Gen: No acute distress, resting comfortably ?CV: Regular rate and rhythm with no murmurs appreciated ?Pulm: Normal work of breathing, clear to auscultation bilaterally with no crackles, wheezes, or rhonchi ?Neuro: Cranial nerves II through XII intact.  Finger-nose-finger testing intact bilaterally.  Strength out of 5 in upper and lower extremities.  Reflexes 2+ and symmetric bilaterally.  Sensation to light touch intact throughout. ?Psych: Normal affect and thought content ? ?   ? ?CAlgis Greenhouse PJerline Pain MD ?07/25/2021 12:22 PM  ?

## 2021-07-26 ENCOUNTER — Telehealth: Payer: Self-pay | Admitting: Family Medicine

## 2021-07-26 ENCOUNTER — Other Ambulatory Visit: Payer: Self-pay | Admitting: *Deleted

## 2021-07-26 MED ORDER — CYCLOBENZAPRINE HCL 10 MG PO TABS: 10.0000 mg | ORAL_TABLET | Freq: Three times a day (TID) | ORAL | 0 refills | Status: DC | PRN

## 2021-07-26 NOTE — Telephone Encounter (Signed)
Pt states still in a lot of pain.  ?She was told to call back if was not better ?Please call ?

## 2021-07-26 NOTE — Telephone Encounter (Signed)
If headache is gone she does not need CT. ? ?Ok to send in flexeril '10mg'$  tid prn spasms. ? ?[name]. [name], MD ?07/26/2021 9:26 AM  ? ?

## 2021-07-26 NOTE — Telephone Encounter (Signed)
Spoke with patient, patient stated injection help yesterday with Headache ?Woke up with neck pain this morning, mile Headache possible from neck pain  ?Requesting muscle relaxer   ?

## 2021-07-26 NOTE — Telephone Encounter (Signed)
Please advise 

## 2021-07-26 NOTE — Telephone Encounter (Signed)
Rx send to pharmacy, patient aware will call if CT need to be order  ?

## 2021-07-26 NOTE — Telephone Encounter (Signed)
Recommend getting a CT scan to make sure there is nothing else going on. Please place order for STAT CT head without contrast. ? ?[name]. [name], MD ?07/26/2021 8:26 AM  ? ?

## 2021-07-27 NOTE — Progress Notes (Signed)
Please inform patient of the following: ? ?Her thyroid level is off a little bit.  I know she has had issues with this in the past.  Recommend she follow-up with endocrinology soon.  Please place new referral if needed. ? ?The rest of her labs are all stable.

## 2021-07-31 ENCOUNTER — Other Ambulatory Visit: Payer: Self-pay | Admitting: Family Medicine

## 2021-08-03 ENCOUNTER — Encounter (HOSPITAL_COMMUNITY): Payer: Self-pay

## 2021-08-07 ENCOUNTER — Telehealth: Payer: Self-pay | Admitting: *Deleted

## 2021-08-07 ENCOUNTER — Other Ambulatory Visit: Payer: Self-pay | Admitting: *Deleted

## 2021-08-07 DIAGNOSIS — E0590 Thyrotoxicosis, unspecified without thyrotoxic crisis or storm: Secondary | ICD-10-CM

## 2021-08-07 NOTE — Telephone Encounter (Signed)
Patient returned call  ?Date of birth verified by patient  ?Lab results given,Pt verbalized understanding  ? Her thyroid level is off a little bit.  I know she has had issues with this in the past.  Recommend she follow-up with endocrinology soon. ?  Referral placed  ?Marland Kitchen ?

## 2021-08-15 ENCOUNTER — Other Ambulatory Visit: Payer: Self-pay

## 2021-08-15 DIAGNOSIS — Z170 Estrogen receptor positive status [ER+]: Secondary | ICD-10-CM

## 2021-08-16 ENCOUNTER — Encounter: Payer: Self-pay | Admitting: Hematology

## 2021-08-16 ENCOUNTER — Inpatient Hospital Stay (HOSPITAL_BASED_OUTPATIENT_CLINIC_OR_DEPARTMENT_OTHER): Payer: BC Managed Care – PPO | Admitting: Hematology

## 2021-08-16 ENCOUNTER — Inpatient Hospital Stay: Payer: BC Managed Care – PPO | Attending: Hematology

## 2021-08-16 ENCOUNTER — Other Ambulatory Visit: Payer: Self-pay

## 2021-08-16 VITALS — BP 131/77 | HR 64 | Temp 99.5°F | Resp 18 | Ht 60.0 in | Wt 166.5 lb

## 2021-08-16 DIAGNOSIS — F419 Anxiety disorder, unspecified: Secondary | ICD-10-CM | POA: Diagnosis not present

## 2021-08-16 DIAGNOSIS — C50412 Malignant neoplasm of upper-outer quadrant of left female breast: Secondary | ICD-10-CM

## 2021-08-16 DIAGNOSIS — Z170 Estrogen receptor positive status [ER+]: Secondary | ICD-10-CM | POA: Diagnosis not present

## 2021-08-16 DIAGNOSIS — R232 Flushing: Secondary | ICD-10-CM | POA: Diagnosis not present

## 2021-08-16 DIAGNOSIS — I10 Essential (primary) hypertension: Secondary | ICD-10-CM | POA: Diagnosis not present

## 2021-08-16 DIAGNOSIS — Z79811 Long term (current) use of aromatase inhibitors: Secondary | ICD-10-CM | POA: Diagnosis not present

## 2021-08-16 LAB — CMP (CANCER CENTER ONLY)
ALT: 17 U/L (ref 0–44)
AST: 15 U/L (ref 15–41)
Albumin: 3.9 g/dL (ref 3.5–5.0)
Alkaline Phosphatase: 93 U/L (ref 38–126)
Anion gap: 6 (ref 5–15)
BUN: 25 mg/dL — ABNORMAL HIGH (ref 6–20)
CO2: 28 mmol/L (ref 22–32)
Calcium: 9.3 mg/dL (ref 8.9–10.3)
Chloride: 106 mmol/L (ref 98–111)
Creatinine: 0.72 mg/dL (ref 0.44–1.00)
GFR, Estimated: >60 mL/min (ref >60)
Glucose, Bld: 114 mg/dL — ABNORMAL HIGH (ref 70–99)
Potassium: 4.4 mmol/L (ref 3.5–5.1)
Sodium: 140 mmol/L (ref 135–145)
Total Bilirubin: 0.3 mg/dL (ref 0.3–1.2)
Total Protein: 7 g/dL (ref 6.5–8.1)

## 2021-08-16 LAB — CBC WITH DIFFERENTIAL (CANCER CENTER ONLY)
Abs Immature Granulocytes: 0 10*3/uL (ref 0.00–0.07)
Basophils Absolute: 0 10*3/uL (ref 0.0–0.1)
Basophils Relative: 1 %
Eosinophils Absolute: 0.1 10*3/uL (ref 0.0–0.5)
Eosinophils Relative: 2 %
HCT: 39.9 % (ref 36.0–46.0)
Hemoglobin: 13.2 g/dL (ref 12.0–15.0)
Immature Granulocytes: 0 %
Lymphocytes Relative: 23 %
Lymphs Abs: 1 10*3/uL (ref 0.7–4.0)
MCH: 28 pg (ref 26.0–34.0)
MCHC: 33.1 g/dL (ref 30.0–36.0)
MCV: 84.7 fL (ref 80.0–100.0)
Monocytes Absolute: 0.4 10*3/uL (ref 0.1–1.0)
Monocytes Relative: 10 %
Neutro Abs: 2.7 10*3/uL (ref 1.7–7.7)
Neutrophils Relative %: 64 %
Platelet Count: 229 10*3/uL (ref 150–400)
RBC: 4.71 MIL/uL (ref 3.87–5.11)
RDW: 13 % (ref 11.5–15.5)
WBC Count: 4.2 10*3/uL (ref 4.0–10.5)
nRBC: 0 % (ref 0.0–0.2)

## 2021-08-16 MED ORDER — EXEMESTANE 25 MG PO TABS: 25.0000 mg | ORAL_TABLET | Freq: Every day | ORAL | 2 refills | Status: DC

## 2021-08-16 NOTE — Progress Notes (Addendum)
[place]   [phone] Fax:([phone]) [phone]   Clinic Follow up Note   Patient Care Team: Vivi Barrack, MD as PCP - General (Family Medicine) Mauro Kaufmann, RN as Oncology Nurse Navigator Rockwell Germany, RN as Oncology Nurse Navigator [name], MD as Consulting Physician (General Surgery) Kyung Rudd, MD as Consulting Physician ([place]) Truitt Merle, MD as Consulting Physician (Hematology) Alla Feeling, NP as Nurse Practitioner (Nurse Practitioner) Earnstine Regal, PA-C as Physician Assistant (Obstetrics and Gynecology)  Date of Service:  08/16/2021  CHIEF COMPLAINT: f/u of left breast cancer  CURRENT THERAPY:  To start exemestane  ASSESSMENT & PLAN:  Sherry Carrillo is a 56 y.o. post-menopausal female with   1. Malignant neoplasm of upper-outer quadrant of left breast, Stage IIA, pT2N1M0, stage IIA, ER+/PR+/HER2-, Grade III, Mammaprint high risk -She underwent Left breast lumpectomy with left axillary sentinel lymph node biopsies on 06/03/20 under Dr. Barry Dienes. Pathology showed: 2.4 cm invasive ductal carcinoma, grade 3; DCIS; one positive lymph node (1/2); positive inferior margin. Her Mammaprint showed high risk luminal type. Margin re-excision 07/27/20 was negative. -Her CT CAP and bone scan from 07/27/20 were negative for distant mets. -To reduce her risk of cancer recurrence, she started adjuvant chemo with docetaxel and Cytoxan (TC) Q3weeks for 4 cycles starting 08/18/20.  -Tolerated cycle 1 poorly with severe stomach pain on day 5 requiring hospitalization. She tolerated cycles 2-4 better with dose reduction and scheduled IVF on day 3, 5 and 6. -She completed all 4 cycles of TC. She received adjuvant radiation 9/20-11/4/22. -I started her on anastrozole in 02/2021. She tolerated poorly with fatigue, weakness, whole-body aches, and general malaise. We discussed other antiestrogen therapy options. I recommend exemestane; she agrees to try. I  called in today. -she is otherwise clinically doing well. Labs reviewed, overall WNL. She declined breast exam today.  -we will call her in 3 months to see how she is tolerating exemestane, then see her back in the office in 6 months.   2. Hot flashes -She was seen to be post-menopausal on 12/2017 labs with [facility] -She started having hot flashes in Fall 2021. Her gyn started on her Estradial patch in 04/2020 which helps very well. Due to ER/PR positive disease, she stopped in March.  -She experienced vomiting with Effexor. -she was previously on gabapentin   3. Social Support, Anxiety  -She is married with 2 adult daughters. Her family is aware of her breast cancer diagnosis. -She is working on coping better with diagnosis and chemo.    4. She declined Genetic Testing (08/10/20)     PLAN:  -start exemestane, she will start at half dose for first month, then increase to full dose if she tolerates well  -phone visit in 3 months to check on tolerance of exemestane -lab and f/u in 6 months -   No problem-specific Assessment & Plan notes found for this encounter.   SUMMARY OF ONCOLOGIC HISTORY: Oncology History Overview Note  Cancer Staging Malignant neoplasm of upper-outer quadrant of left breast, estrogen receptor positive ([place]) Staging form: Breast, AJCC 8th Edition - Clinical stage from 05/31/2020: Stage IIA (cT2, cN0, cM0, G3, ER+, PR+, HER2-) - Signed by Truitt Merle, MD on 06/01/2020 Stage prefix: Initial diagnosis    Malignant neoplasm of upper-outer quadrant of left breast, estrogen receptor positive ([place])  05/17/2020 Mammogram   IMPRESSION: 1. Highly suspicious 2.3 cm mass involving the UPPER OUTER QUADRANT of the LEFT breast at  the 1 o'clock position approximately 8 cm from the nipple. 2. No pathologic LEFT axillary lymphadenopathy. 3. No mammographic evidence of malignancy involving the RIGHT breast.   05/17/2020 Initial Biopsy   Diagnosis Breast, left,  needle core biopsy, 1 o'clock - INVASIVE MAMMARY CARCINOMA.  Microscopic Comment The carcinoma appears grade 3. The greatest linear extent of tumor in any one core is 10 mm. E-cadherin will be reported separately. Ancillary studies will be reported separately. Results reported to [facility] on 05/18/2020. Dr. Tresa Moore reviewed the case. ADDENDUM: Immunohistochemistry for E-cadherin is positive supporting a ductal phenotype.    05/17/2020 Receptors her2   PROGNOSTIC INDICATORS Results: IMMUNOHISTOCHEMICAL AND MORPHOMETRIC ANALYSIS PERFORMED MANUALLY The tumor cells are NEGATIVE for Her2 (1+). Estrogen Receptor: 90%, POSITIVE, STRONG STAINING INTENSITY Progesterone Receptor: 60%, POSITIVE, STRONG STAINING INTENSITY Proliferation Marker Ki67: 70%   05/31/2020 Initial Diagnosis   Malignant neoplasm of upper-outer quadrant of left breast, estrogen receptor positive ([place])    05/31/2020 Cancer Staging   Staging form: Breast, AJCC 8th Edition - Clinical stage from 05/31/2020: Stage IIA (cT2, cN0, cM0, G3, ER+, PR+, HER2-) - Signed by Truitt Merle, MD on 06/01/2020 Stage prefix: Initial diagnosis    06/03/2020 Cancer Staging   Staging form: Breast, AJCC 8th Edition - Pathologic stage from 06/03/2020: Stage IIA (pT2, pN1a, cM0, G3, ER+, PR+, HER2-) - Signed by Truitt Merle, MD on 06/29/2020 Stage prefix: Initial diagnosis Multigene prognostic tests performed: MammaPrint Histologic grading system: 3 grade system    06/03/2020 Surgery   Left breast lumpectomy with left axillary sentinel lymph node  biopsies  A. 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  - High grade ductal carcinoma in situ.  - Invasive carcinoma is at the inferior margin focally and <0.1 cm from  the anterior margin focally.  - In situ carcinoma is <0.1 cm from the anterior margin focally.  - Biopsy site.  - See oncology table.   B. LYMPH NODE, LEFT AXILLARY, #1, SENTINEL,  EXCISION:  - Metastatic carcinoma in one of one lymph nodes (1/1).   C. LYMPH NODE, LEFT AXILLARY, #2, SENTINEL, EXCISION:  - One of one lymph nodes negative for carcinoma (0/1).   06/03/2020 Miscellaneous   Mammaprint - HIGH RISK   07/20/2020 Imaging   CT CAP  IMPRESSION: 1. Surgical clips in the area of the LEFT breast laterally along the margin of water density well-circumscribed area that measures approximately 4.2 x 3.7 cm. This is likely a postoperative seroma. Correlate with any pain or developing symptoms that would suggest infection. No gas or secondary findings to suggest this at this time. 2. No evidence of metastatic disease in the chest, abdomen or pelvis. 3. Hepatic steatosis without focal lesion. 4. Calcified coronary artery disease. 5. Aortic atherosclerosis. 6. Mildly nodular thyroid. 7. Lesion in the RIGHT thyroid measuring up to 1.8 cm. Recommend thyroid US (ref: J Am Coll Radiol. 2015 Feb;12(2): 143-50).     07/20/2020 Imaging   Bone Scan  IMPRESSION: Signs of spondylosis of the thoracic and lumbar spine. No scintigraphic evidence of metastatic disease.     07/27/2020 Surgery   RE-EXCISION OF LEFT BREAST LUMPECTOMY and PAC placement by Dr Barry Dienes  FINAL MICROSCOPIC DIAGNOSIS:   A. BREAST, LEFT ANTERIOR MARGIN, EXCISION:  - Prior procedure site changes.  No carcinoma identified.   B. BREAST, LEFT INFERIOR MARGIN, EXCISION:  - Prior procedure site changes.  No carcinoma identified.    08/18/2020 - 10/20/2020 Chemotherapy  docetaxel and Cytoxan (TC) Q3weeks for 4 cycles.     12/13/2020 - 01/27/2021 Radiation Therapy   Site Technique Total Dose (Gy) Dose per Fx (Gy) Completed Fx Beam Energies  Breast, Left: Breast_Lt 3D 50.4/50.4 1.8 28/28 10X  Breast, Left: Breast_Lt_SCLV 3D 50.4/50.4 1.8 28/28 6X, 10X  Breast, Left: Breast_Lt_Bst 3D 10/10 2 5/5 10X      02/23/2021 -  Anti-estrogen oral therapy   Anastrozole   05/03/2021 Survivorship   SCP delivered  virtually by Cira Rue, NP      INTERVAL HISTORY:  Sherry Carrillo is here for a follow up of breast cancer. She was last seen by me on 01/27/21 with survivorship in the interim. She presents to the clinic alone. She reports she is experiencing side effects from anastrozole-- fatigue, body aches, weakness, feels "off." She notes she took it for about 2-3 months, on and off due to vacations.   All other systems were reviewed with the patient and are negative.  MEDICAL HISTORY:  Past Medical History:  Diagnosis Date   Abnormal pap 2009   PT HAD SURGERY BUT DON'T KNOW WHAT TYPE   Breast cancer ([place]) 04/27/2020   COVID 2021   GERD (gastroesophageal reflux disease)    Headache(784.0)    otc meds prn   Hypertension    Hyperthyroidism    Personal history of chemotherapy    Personal history of radiation therapy    SVD (spontaneous vaginal delivery)    x 2    SURGICAL HISTORY: Past Surgical History:  Procedure Laterality Date   BREAST LUMPECTOMY     BREAST LUMPECTOMY WITH RADIOACTIVE SEED AND SENTINEL LYMPH NODE BIOPSY Left 06/03/2020   Procedure: RADIOACTIVE SEED GUIDED LEFT BREAST LUMPECTOMY, LEFT AXILLARY SENTINEL LYMPH NODE BIOPSY;  Surgeon: [name], MD;  Location: [facility];  Service: General;  Laterality: Left;   FOOT SURGERY     left   PORT-A-CATH REMOVAL N/A 02/02/2021   Procedure: REMOVAL PORT-A-CATH;  Surgeon: [name], MD;  Location: [facility];  Service: General;  Laterality: N/A;   PORTACATH PLACEMENT Left 07/27/2020   Procedure: INSERTION PORT-A-CATH;  Surgeon: [name], MD;  Location: [facility];  Service: General;  Laterality: Left;   RE-EXCISION OF BREAST LUMPECTOMY Left 07/27/2020   Procedure: RE-EXCISION OF LEFT BREAST LUMPECTOMY;  Surgeon: [name], MD;  Location: [facility];  Service: General;  Laterality: Left;   TUBAL LIGATION     WISDOM TOOTH EXTRACTION      I have reviewed the social history and family history with the patient and they are unchanged from  previous note.  ALLERGIES:  is allergic to sulfa antibiotics.  MEDICATIONS:  Current Outpatient Medications  Medication Sig Dispense Refill   exemestane (AROMASIN) 25 MG tablet Take 1 tablet (25 mg total) by mouth daily after breakfast. 30 tablet 2   acetaminophen (TYLENOL) 325 MG tablet Take 650 mg by mouth every 6 (six) hours as needed.     cyclobenzaprine (FLEXERIL) 10 MG tablet Take 1 tablet (10 mg total) by mouth 3 (three) times daily as needed for muscle spasms. 30 tablet 0   lisinopril-hydrochlorothiazide (ZESTORETIC) 20-12.5 MG tablet Take 1 tablet by mouth daily. 90 tablet 3   pantoprazole (PROTONIX) 40 MG tablet Take 1 tablet (40 mg total) by mouth daily. 30 tablet 3   prochlorperazine (COMPAZINE) 10 MG tablet Take 1 tablet (10 mg total) by mouth every 6 (six) hours as needed for nausea or vomiting (headache). 30 tablet 0   scopolamine (TRANSDERM-SCOP)  1 MG/3DAYS Place 1 patch (1.5 mg total) onto the skin every 3 (three) days. 4 patch 0   No current facility-administered medications for this visit.    PHYSICAL EXAMINATION: ECOG PERFORMANCE STATUS: 0 - Asymptomatic  Vitals:   08/16/21 0826  BP: 131/77  Pulse: 64  Resp: 18  Temp: 99.5 F (37.5 C)  SpO2: 100%   Wt Readings from Last 3 Encounters:  08/16/21 166 lb 8 oz (75.5 kg)  07/25/21 167 lb 12.8 oz (76.1 kg)  06/20/21 167 lb 3.2 oz (75.8 kg)     GENERAL:alert, no distress and comfortable SKIN: skin color, texture, turgor are normal, no rashes or significant lesions EYES: normal, Conjunctiva are pink and non-injected, sclera clear  NECK: supple, thyroid normal size, non-tender, without nodularity LYMPH:  no palpable lymphadenopathy in the cervical, axillary LUNGS: clear to auscultation and percussion with normal breathing effort HEART: regular rate & rhythm and no murmurs and no lower extremity edema ABDOMEN:abdomen soft, non-tender and normal bowel sounds Musculoskeletal:no cyanosis of digits and no clubbing   NEURO: alert & oriented x 3 with fluent speech, no focal motor/sensory deficits BREAST: declined  LABORATORY DATA:  I have reviewed the data as listed    Latest Ref Rng & Units 08/16/2021    8:14 AM 07/25/2021   11:59 AM 05/03/2021   12:19 PM  CBC  WBC 4.0 - 10.5 K/uL 4.2   4.8   6.6    Hemoglobin 12.0 - 15.0 g/dL 13.2   13.1   12.8    Hematocrit 36.0 - 46.0 % 39.9   40.0   38.1    Platelets 150 - 400 K/uL 229   228.0   268          Latest Ref Rng & Units 08/16/2021    8:14 AM 07/25/2021   11:59 AM 05/03/2021   12:19 PM  CMP  Glucose 70 - 99 mg/dL 114   113   103    BUN 6 - 20 mg/dL _0 Creatinine 0.44 - 1.00 mg/dL 0.72   0.88   0.73    Sodium 135 - 145 mmol/L 140   138   140    Potassium 3.5 - 5.1 mmol/L 4.4   3.8   4.0    Chloride 98 - 111 mmol/L 106   104   106    CO2 22 - 32 mmol/L _1 Calcium 8.9 - 10.3 mg/dL 9.3   9.4   9.5    Total Protein 6.5 - 8.1 g/dL 7.0   6.9   6.8    Total Bilirubin 0.3 - 1.2 mg/dL 0.3   0.3   0.4    Alkaline Phos 38 - 126 U/L 93   78   73    AST 15 - 41 U/L _2 ALT 0 - 44 U/L _3 RADIOGRAPHIC STUDIES: I have personally reviewed the radiological images as listed and agreed with the findings in the report. No results found.    No orders of the defined types were placed in this encounter.  All questions were answered. The patient knows to call the clinic with any problems, questions or concerns. No barriers to learning was detected. The total time spent in the appointment was 30 minutes.  Truitt Merle, MD 08/16/2021   I, Wilburn Mylar, am acting as scribe for Truitt Merle, MD.   I have reviewed the above documentation for accuracy and completeness, and I agree with the above.

## 2021-08-17 ENCOUNTER — Telehealth: Payer: Self-pay | Admitting: Hematology

## 2021-08-17 NOTE — Telephone Encounter (Signed)
Scheduled appointment per 05/24 los. Left message.

## 2021-09-30 ENCOUNTER — Other Ambulatory Visit: Payer: Self-pay | Admitting: Hematology

## 2021-10-02 ENCOUNTER — Encounter: Payer: Self-pay | Admitting: Hematology

## 2021-10-03 ENCOUNTER — Other Ambulatory Visit: Payer: Self-pay | Admitting: *Deleted

## 2021-10-03 MED ORDER — LISINOPRIL-HYDROCHLOROTHIAZIDE 20-12.5 MG PO TABS: 1.0000 | ORAL_TABLET | Freq: Every day | ORAL | 3 refills | Status: DC

## 2021-10-16 ENCOUNTER — Other Ambulatory Visit: Payer: Self-pay

## 2021-10-23 ENCOUNTER — Other Ambulatory Visit: Payer: Self-pay

## 2021-11-01 ENCOUNTER — Telehealth: Payer: Self-pay | Admitting: Hematology

## 2021-11-01 NOTE — Telephone Encounter (Signed)
Unable to leave message with rescheduled upcoming appointment due to provider's PAL. Mailed calendar. 

## 2021-11-08 ENCOUNTER — Other Ambulatory Visit: Payer: Self-pay | Admitting: Hematology

## 2021-11-17 ENCOUNTER — Inpatient Hospital Stay: Payer: BC Managed Care – PPO | Attending: Hematology | Admitting: Hematology

## 2021-11-17 ENCOUNTER — Inpatient Hospital Stay: Payer: BC Managed Care – PPO | Admitting: Hematology

## 2021-11-17 ENCOUNTER — Encounter: Payer: Self-pay | Admitting: Hematology

## 2021-11-17 DIAGNOSIS — Z170 Estrogen receptor positive status [ER+]: Secondary | ICD-10-CM | POA: Diagnosis not present

## 2021-11-17 DIAGNOSIS — C50412 Malignant neoplasm of upper-outer quadrant of left female breast: Secondary | ICD-10-CM

## 2021-11-17 MED ORDER — EXEMESTANE 25 MG PO TABS: ORAL_TABLET | ORAL | 1 refills | Status: DC

## 2021-11-17 NOTE — Progress Notes (Signed)
Sherry Carrillo   [phone] [phone]) [phone]   Clinic Follow up Note   Patient Care Team: Vivi Barrack, MD as PCP - General (Family Medicine) Mauro Kaufmann, RN as Oncology Nurse Navigator Rockwell Germany, RN as Oncology Nurse Navigator [name], MD as Consulting Physician (General Surgery) Kyung Rudd, MD as Consulting Physician ([place]) Truitt Merle, MD as Consulting Physician (Hematology) Alla Feeling, NP as Nurse Practitioner (Nurse Practitioner) Earnstine Regal, PA-C as Physician Assistant (Obstetrics and Gynecology)  Date of Service:  11/17/2021  I connected with Sherry Carrillo on 11/17/2021 at  8:20 AM EDT by telephone visit and verified that I am speaking with the correct person using two identifiers.  I discussed the limitations, risks, security and privacy concerns of performing an evaluation and management service by telephone and the availability of in person appointments. I also discussed with the patient that there may be a patient responsible charge related to this service. The patient expressed understanding and agreed to proceed.   Other persons participating in the visit and their role in the encounter:  none   Patient's location:  Office  Provider's location:  Home   CHIEF COMPLAINT: f/u of antiestrogen therapy tolerance, left breast cancer  CURRENT THERAPY:  Antiestrogen therapy, starting 02/2021, currently exemestane since 07/2021  ASSESSMENT & PLAN:  Sherry Carrillo is a 56 y.o. female with   1. Malignant neoplasm of upper-outer quadrant of left breast, IDC, Stage IIA, pT2N1M0, stage IIA, ER+/PR+/HER2-, Grade III, Mammaprint high risk -S/p Left breast lumpectomy on 06/03/20 under Dr. Barry Dienes. Pathology showed: 2.4 cm invasive ductal carcinoma, grade 3; DCIS; one positive lymph node (1/2); positive inferior margin. Her Mammaprint showed high risk luminal type. Margin re-excision 07/27/20 was negative. -Her CT CAP and bone scan  from 07/27/20 were negative for distant mets. -treated with 4 cycles TC 08/18/20 - 10/20/20, required dose reduction and IVF on days 3, 5, and 6. -s/p adjuvant radiation 12/13/20 - 01/27/21. -I started her on anastrozole in 02/2021. She tolerated poorly with fatigue, weakness, whole-body aches, and general malaise. She took intermittently (about 2-3 months worth of medicine) and was switched to exemestane 08/16/21. -she is tolerating exemestane much better, she has mild hot flush and fatigue, but overall much more tolerable, will continue    2. Hot flashes -She was seen to be post-menopausal on 12/2017 labs with [facility] -She started having hot flashes in Fall 2021. Her gyn started on her Estradial patch in 04/2020 which helps very well. Due to ER/PR positive disease, she stopped in March.  -She experienced vomiting with Effexor. -she was previously on gabapentin -hot flush is mild and overall manageable   3. Bone Health -she is scheduled for baseline DEXA 12/26/21     PLAN:  -continue exemestane -DEXA 12/26/21 -lab and f/u with NP Lacie in 3 months as scheduled   No problem-specific Assessment & Plan notes found for this encounter.   SUMMARY OF ONCOLOGIC HISTORY: Oncology History Overview Note  Cancer Staging Malignant neoplasm of upper-outer quadrant of left breast, estrogen receptor positive ([place]) Staging form: Breast, AJCC 8th Edition - Clinical stage from 05/31/2020: Stage IIA (cT2, cN0, cM0, G3, ER+, PR+, HER2-) - Signed by Truitt Merle, MD on 06/01/2020 Stage prefix: Initial diagnosis    Malignant neoplasm of upper-outer quadrant of left breast, estrogen receptor positive ([place])  05/17/2020 Mammogram   IMPRESSION: 1. Highly suspicious 2.3 cm mass involving the UPPER OUTER QUADRANT of the LEFT  breast at the 1 o'clock position approximately 8 cm from the nipple. 2. No pathologic LEFT axillary lymphadenopathy. 3. No mammographic evidence of malignancy involving the  RIGHT breast.   05/17/2020 Initial Biopsy   Diagnosis Breast, left, needle core biopsy, 1 o'clock - INVASIVE MAMMARY CARCINOMA.  Microscopic Comment The carcinoma appears grade 3. The greatest linear extent of tumor in any one core is 10 mm. E-cadherin will be reported separately. Ancillary studies will be reported separately. Results reported to [facility] on 05/18/2020. Dr. Tresa Moore reviewed the case. ADDENDUM: Immunohistochemistry for E-cadherin is positive supporting a ductal phenotype.    05/17/2020 Receptors her2   PROGNOSTIC INDICATORS Results: IMMUNOHISTOCHEMICAL AND MORPHOMETRIC ANALYSIS PERFORMED MANUALLY The tumor cells are NEGATIVE for Her2 (1+). Estrogen Receptor: 90%, POSITIVE, STRONG STAINING INTENSITY Progesterone Receptor: 60%, POSITIVE, STRONG STAINING INTENSITY Proliferation Marker Ki67: 70%   05/31/2020 Initial Diagnosis   Malignant neoplasm of upper-outer quadrant of left breast, estrogen receptor positive ([place])   05/31/2020 Cancer Staging   Staging form: Breast, AJCC 8th Edition - Clinical stage from 05/31/2020: Stage IIA (cT2, cN0, cM0, G3, ER+, PR+, HER2-) - Signed by Truitt Merle, MD on 06/01/2020 Stage prefix: Initial diagnosis   06/03/2020 Cancer Staging   Staging form: Breast, AJCC 8th Edition - Pathologic stage from 06/03/2020: Stage IIA (pT2, pN1a, cM0, G3, ER+, PR+, HER2-) - Signed by Truitt Merle, MD on 06/29/2020 Stage prefix: Initial diagnosis Multigene prognostic tests performed: MammaPrint Histologic grading system: 3 grade system   06/03/2020 Surgery   Left breast lumpectomy with left axillary sentinel lymph node  biopsies  A. 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  - High grade ductal carcinoma in situ.  - Invasive carcinoma is at the inferior margin focally and <0.1 cm from  the anterior margin focally.  - In situ carcinoma is <0.1 cm from the anterior margin focally.  - Biopsy site.  - See  oncology table.   B. 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   C. LYMPH NODE, LEFT AXILLARY, #2, SENTINEL, EXCISION:  - One of one lymph nodes negative for carcinoma (0/1).   06/03/2020 Miscellaneous   Mammaprint - HIGH RISK   07/20/2020 Imaging   CT CAP  IMPRESSION: 1. Surgical clips in the area of the LEFT breast laterally along the margin of water density well-circumscribed area that measures approximately 4.2 x 3.7 cm. This is likely a postoperative seroma. Correlate with any pain or developing symptoms that would suggest infection. No gas or secondary findings to suggest this at this time. 2. No evidence of metastatic disease in the chest, abdomen or pelvis. 3. Hepatic steatosis without focal lesion. 4. Calcified coronary artery disease. 5. Aortic atherosclerosis. 6. Mildly nodular thyroid. 7. Lesion in the RIGHT thyroid measuring up to 1.8 cm. Recommend thyroid US (ref: J Am Coll Radiol. 2015 Feb;12(2): 143-50).     07/20/2020 Imaging   Bone Scan  IMPRESSION: Signs of spondylosis of the thoracic and lumbar spine. No scintigraphic evidence of metastatic disease.     07/27/2020 Surgery   RE-EXCISION OF LEFT BREAST LUMPECTOMY and PAC placement by Dr Barry Dienes  FINAL MICROSCOPIC DIAGNOSIS:   A. BREAST, LEFT ANTERIOR MARGIN, EXCISION:  - Prior procedure site changes.  No carcinoma identified.   B. BREAST, LEFT INFERIOR MARGIN, EXCISION:  - Prior procedure site changes.  No carcinoma identified.    08/18/2020 - 10/20/2020 Chemotherapy  docetaxel and Cytoxan (TC) Q3weeks for 4 cycles.     12/13/2020 - 01/27/2021 Radiation Therapy   Site Technique Total Dose (Gy) Dose per Fx (Gy) Completed Fx Beam Energies  Breast, Left: Breast_Lt 3D 50.4/50.4 1.8 28/28 10X  Breast, Left: Breast_Lt_SCLV 3D 50.4/50.4 1.8 28/28 6X, 10X  Breast, Left: Breast_Lt_Bst 3D 10/10 2 5/5 10X      02/23/2021 -  Anti-estrogen oral  therapy   Anastrozole   05/03/2021 Survivorship   SCP delivered virtually by Cira Rue, NP      INTERVAL HISTORY:  Sherry Carrillo was contacted for a follow up to see how she is tolerating exemestane. She was last seen by me on 08/16/21.  She has been taking exemestane since last visit, she is tolerating it well, with mild fatigue and hot flushes, no noticeable arthritis pain or other complains.    All other systems were reviewed with the patient and are negative.  MEDICAL HISTORY:  Past Medical History:  Diagnosis Date   Abnormal pap 2009   PT HAD SURGERY BUT DON'T KNOW WHAT TYPE   Breast cancer ([place]) 04/27/2020   COVID 2021   GERD (gastroesophageal reflux disease)    Headache(784.0)    otc meds prn   Hypertension    Hyperthyroidism    Personal history of chemotherapy    Personal history of radiation therapy    SVD (spontaneous vaginal delivery)    x 2    SURGICAL HISTORY: Past Surgical History:  Procedure Laterality Date   BREAST LUMPECTOMY     BREAST LUMPECTOMY WITH RADIOACTIVE SEED AND SENTINEL LYMPH NODE BIOPSY Left 06/03/2020   Procedure: RADIOACTIVE SEED GUIDED LEFT BREAST LUMPECTOMY, LEFT AXILLARY SENTINEL LYMPH NODE BIOPSY;  Surgeon: [name], MD;  Location: [facility];  Service: General;  Laterality: Left;   FOOT SURGERY     left   PORT-A-CATH REMOVAL N/A 02/02/2021   Procedure: REMOVAL PORT-A-CATH;  Surgeon: [name], MD;  Location: [facility];  Service: General;  Laterality: N/A;   PORTACATH PLACEMENT Left 07/27/2020   Procedure: INSERTION PORT-A-CATH;  Surgeon: [name], MD;  Location: [facility];  Service: General;  Laterality: Left;   RE-EXCISION OF BREAST LUMPECTOMY Left 07/27/2020   Procedure: RE-EXCISION OF LEFT BREAST LUMPECTOMY;  Surgeon: [name], MD;  Location: [facility];  Service: General;  Laterality: Left;   TUBAL LIGATION     WISDOM TOOTH EXTRACTION      I have reviewed the social history and family history with the patient and they are  unchanged from previous note.  ALLERGIES:  is allergic to sulfa antibiotics.  MEDICATIONS:  Current Outpatient Medications  Medication Sig Dispense Refill   acetaminophen (TYLENOL) 325 MG tablet Take 650 mg by mouth every 6 (six) hours as needed.     cyclobenzaprine (FLEXERIL) 10 MG tablet Take 1 tablet (10 mg total) by mouth 3 (three) times daily as needed for muscle spasms. 30 tablet 0   exemestane (AROMASIN) 25 MG tablet TAKE 1 TABLET(25 MG) BY MOUTH DAILY AFTER BREAKFAST 90 tablet 1   lisinopril-hydrochlorothiazide (ZESTORETIC) 20-12.5 MG tablet Take 1 tablet by mouth daily. 90 tablet 3   pantoprazole (PROTONIX) 40 MG tablet Take 1 tablet (40 mg total) by mouth daily. 30 tablet 3   prochlorperazine (COMPAZINE) 10 MG tablet Take 1 tablet (10 mg total) by mouth every 6 (six) hours as needed for nausea or vomiting (headache). 30 tablet 0   scopolamine (TRANSDERM-SCOP) 1 MG/3DAYS Place 1 patch (1.5 mg total) onto the skin  every 3 (three) days. 4 patch 0   No current facility-administered medications for this visit.    PHYSICAL EXAMINATION: ECOG PERFORMANCE STATUS: 0 - Asymptomatic  There were no vitals filed for this visit. Wt Readings from Last 3 Encounters:  08/16/21 166 lb 8 oz (75.5 kg)  07/25/21 167 lb 12.8 oz (76.1 kg)  06/20/21 167 lb 3.2 oz (75.8 kg)     No vitals taken today, Exam not performed today  LABORATORY DATA:  I have reviewed the data as listed    Latest Ref Rng & Units 08/16/2021    8:14 AM 07/25/2021   11:59 AM 05/03/2021   12:19 PM  CBC  WBC 4.0 - 10.5 K/uL 4.2  4.8  6.6   Hemoglobin 12.0 - 15.0 g/dL 13.2  13.1  12.8   Hematocrit 36.0 - 46.0 % 39.9  40.0  38.1   Platelets 150 - 400 K/uL 229  228.0  268         Latest Ref Rng & Units 08/16/2021    8:14 AM 07/25/2021   11:59 AM 05/03/2021   12:19 PM  CMP  Glucose 70 - 99 mg/dL 114  113  103   BUN 6 - 20 mg/dL _0 Creatinine 0.44 - 1.00 mg/dL 0.72  0.88  0.73   Sodium 135 - 145 mmol/L 140  138   140   Potassium 3.5 - 5.1 mmol/L 4.4  3.8  4.0   Chloride 98 - 111 mmol/L 106  104  106   CO2 22 - 32 mmol/L _1 Calcium 8.9 - 10.3 mg/dL 9.3  9.4  9.5   Total Protein 6.5 - 8.1 g/dL 7.0  6.9  6.8   Total Bilirubin 0.3 - 1.2 mg/dL 0.3  0.3  0.4   Alkaline Phos 38 - 126 U/L 93  78  73   AST 15 - 41 U/L _2 ALT 0 - 44 U/L _3 RADIOGRAPHIC STUDIES: I have personally reviewed the radiological images as listed and agreed with the findings in the report. No results found.    No orders of the defined types were placed in this encounter.  All questions were answered. The patient knows to call the clinic with any problems, questions or concerns. No barriers to learning was detected. The total time spent in the appointment was 25 minutes.     Truitt Merle, MD 11/17/2021   I, Wilburn Mylar, am acting as scribe for Truitt Merle, MD.   I have reviewed the above documentation for accuracy and completeness, and I agree with the above.

## 2021-12-26 ENCOUNTER — Inpatient Hospital Stay: Admission: RE | Admit: 2021-12-26 | Payer: BC Managed Care – PPO | Source: Ambulatory Visit

## 2022-01-11 ENCOUNTER — Other Ambulatory Visit: Payer: Self-pay

## 2022-01-12 ENCOUNTER — Telehealth: Payer: Self-pay

## 2022-01-12 NOTE — Telephone Encounter (Signed)
Spoke with pt via telephone regarding the natural herbal liquid interaction with pt's Exemestane.  Informed pt that our pharmacist has researched the herbal liquid ingredients and there were some of the ingredients that does interact with the pt's Exemestane.  Informed pt of those interactions and advised pt based off these interaction to refrain from taking the natural herbal supplement.  Pt verbalized understanding and stated she had told the sales lady that the drug would interact but the sales lady was certain that none of the ingredients would interact with her medication.  Pt had no further questions or concerns.  Britt Boozer, RPH  Evalee Jefferson, RN; Laurann Montana, CPhT Cc: Truitt Merle, MD That is a LOT of ingredients...out of all of them, the following showed interactions with exemestane:  - The green tea extract could (this is based on in vitro studies) reduce levels of exemestane (thus decreased efficacy of exemestane) - The rhodiola could increase levels of exemestane (increased risk of side effects from exemestane) - The peppermint leaf oil could increase levels of exemestane  Let me know if I can help with anything else, [name]

## 2022-02-13 NOTE — Progress Notes (Deleted)
Sherry Carrillo   [phone] [phone]   Clinic Follow up Note   Patient Care Team: Vivi Barrack, MD as PCP - General (Family Medicine) Mauro Kaufmann, RN as Oncology Nurse Navigator Rockwell Germany, RN as Oncology Nurse Navigator [name], MD as Consulting Physician (General Surgery) Kyung Rudd, MD as Consulting Physician (Radiation Oncology) Truitt Merle, MD as Consulting Physician (Hematology) Alla Feeling, NP as Nurse Practitioner (Nurse Practitioner) Earnstine Regal, PA-C as Physician Assistant (Obstetrics and Gynecology) 02/13/2022  CHIEF COMPLAINT: Follow-up left breast cancer  SUMMARY OF ONCOLOGIC HISTORY: Oncology History Overview Note  Cancer Staging Malignant neoplasm of upper-outer quadrant of left breast, estrogen receptor positive (Sherry Carrillo) Staging form: Breast, AJCC 8th Edition - Clinical stage from 05/31/2020: Stage IIA (cT2, cN0, cM0, G3, ER+, PR+, HER2-) - Signed by Truitt Merle, MD on 06/01/2020 Stage prefix: Initial diagnosis    Malignant neoplasm of upper-outer quadrant of left breast, estrogen receptor positive (Sherry Carrillo)  05/17/2020 Mammogram   IMPRESSION: 1. Highly suspicious 2.3 cm mass involving the UPPER OUTER QUADRANT of the LEFT breast at the 1 o'clock position approximately 8 cm from the nipple. 2. No pathologic LEFT axillary lymphadenopathy. 3. No mammographic evidence of malignancy involving the RIGHT breast.   05/17/2020 Initial Biopsy   Diagnosis Breast, left, needle core biopsy, 1 o'clock - INVASIVE MAMMARY CARCINOMA.  Microscopic Comment The carcinoma appears grade 3. The greatest linear extent of tumor in any one core is 10 mm. E-cadherin will be reported separately. Ancillary studies will be reported separately. Results reported to [facility] on 05/18/2020. Dr. Tresa Moore reviewed the case. ADDENDUM: Immunohistochemistry for E-cadherin is positive supporting a ductal phenotype.     05/17/2020 Receptors her2   PROGNOSTIC INDICATORS Results: IMMUNOHISTOCHEMICAL AND MORPHOMETRIC ANALYSIS PERFORMED MANUALLY The tumor cells are NEGATIVE for Her2 (1+). Estrogen Receptor: 90%, POSITIVE, STRONG STAINING INTENSITY Progesterone Receptor: 60%, POSITIVE, STRONG STAINING INTENSITY Proliferation Marker Ki67: 70%   05/31/2020 Initial Diagnosis   Malignant neoplasm of upper-outer quadrant of left breast, estrogen receptor positive (Sherry Carrillo)   05/31/2020 Cancer Staging   Staging form: Breast, AJCC 8th Edition - Clinical stage from 05/31/2020: Stage IIA (cT2, cN0, cM0, G3, ER+, PR+, HER2-) - Signed by Truitt Merle, MD on 06/01/2020 Stage prefix: Initial diagnosis   06/03/2020 Cancer Staging   Staging form: Breast, AJCC 8th Edition - Pathologic stage from 06/03/2020: Stage IIA (pT2, pN1a, cM0, G3, ER+, PR+, HER2-) - Signed by Truitt Merle, MD on 06/29/2020 Stage prefix: Initial diagnosis Multigene prognostic tests performed: MammaPrint Histologic grading system: 3 grade system   06/03/2020 Surgery   Left breast lumpectomy with left axillary sentinel lymph node  biopsies  A. 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  - High grade ductal carcinoma in situ.  - Invasive carcinoma is at the inferior margin focally and <0.1 cm from  the anterior margin focally.  - In situ carcinoma is <0.1 cm from the anterior margin focally.  - Biopsy site.  - See oncology table.   B. 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   C. LYMPH NODE, LEFT AXILLARY, #2, SENTINEL, EXCISION:  - One of one lymph nodes negative for carcinoma (0/1).   06/03/2020 Miscellaneous   Mammaprint - HIGH RISK   07/20/2020 Imaging   CT CAP  IMPRESSION: 1. Surgical clips in the area of the LEFT breast laterally along the margin  of water density well-circumscribed area that measures approximately 4.2 x 3.7 cm. This is likely a  postoperative seroma. Correlate with any pain or developing symptoms that would suggest infection. No gas or secondary findings to suggest this at this time. 2. No evidence of metastatic disease in the chest, abdomen or pelvis. 3. Hepatic steatosis without focal lesion. 4. Calcified coronary artery disease. 5. Aortic atherosclerosis. 6. Mildly nodular thyroid. 7. Lesion in the RIGHT thyroid measuring up to 1.8 cm. Recommend thyroid US (ref: J Am Coll Radiol. 2015 Feb;12(2): 143-50).     07/20/2020 Imaging   Bone Scan  IMPRESSION: Signs of spondylosis of the thoracic and lumbar spine. No scintigraphic evidence of metastatic disease.     07/27/2020 Surgery   RE-EXCISION OF LEFT BREAST LUMPECTOMY and PAC placement by Dr Barry Dienes  FINAL MICROSCOPIC DIAGNOSIS:   A. BREAST, LEFT ANTERIOR MARGIN, EXCISION:  - Prior procedure site changes.  No carcinoma identified.   B. BREAST, LEFT INFERIOR MARGIN, EXCISION:  - Prior procedure site changes.  No carcinoma identified.    08/18/2020 - 10/20/2020 Chemotherapy   docetaxel and Cytoxan (TC) Q3weeks for 4 cycles.     12/13/2020 - 01/27/2021 Radiation Therapy   Site Technique Total Dose (Gy) Dose per Fx (Gy) Completed Fx Beam Energies  Breast, Left: Breast_Lt 3D 50.4/50.4 1.8 28/28 10X  Breast, Left: Breast_Lt_SCLV 3D 50.4/50.4 1.8 28/28 6X, 10X  Breast, Left: Breast_Lt_Bst 3D 10/10 2 5/5 10X      02/23/2021 -  Anti-estrogen oral therapy   Anastrozole   05/03/2021 Survivorship   SCP delivered virtually by Cira Rue, NP     CURRENT THERAPY: Antiestrogen therapy starting 02/2021, currently exemestane since 07/2021  INTERVAL HISTORY: Sherry Carrillo returns for follow-up as scheduled, last seen virtually by Dr. Burr Medico 11/17/2021.  She continues exemestane.   REVIEW OF SYSTEMS:   Constitutional: Denies fevers, chills or abnormal weight loss Eyes: Denies blurriness of vision Ears, nose, mouth, throat, and face: Denies mucositis or sore  throat Respiratory: Denies cough, dyspnea or wheezes Cardiovascular: Denies palpitation, chest discomfort or lower extremity swelling Gastrointestinal:  Denies nausea, heartburn or change in bowel habits Skin: Denies abnormal skin rashes Lymphatics: Denies new lymphadenopathy or easy bruising Neurological:Denies numbness, tingling or new weaknesses Behavioral/Psych: Mood is stable, no new changes  All other systems were reviewed with the patient and are negative.  MEDICAL HISTORY:  Past Medical History:  Diagnosis Date   Abnormal pap 2009   PT HAD SURGERY BUT DON'T KNOW WHAT TYPE   Breast cancer ([place]) 04/27/2020   COVID 2021   GERD (gastroesophageal reflux disease)    Headache(784.0)    otc meds prn   Hypertension    Hyperthyroidism    Personal history of chemotherapy    Personal history of radiation therapy    SVD (spontaneous vaginal delivery)    x 2    SURGICAL HISTORY: Past Surgical History:  Procedure Laterality Date   BREAST LUMPECTOMY     BREAST LUMPECTOMY WITH RADIOACTIVE SEED AND SENTINEL LYMPH NODE BIOPSY Left 06/03/2020   Procedure: RADIOACTIVE SEED GUIDED LEFT BREAST LUMPECTOMY, LEFT AXILLARY SENTINEL LYMPH NODE BIOPSY;  Surgeon: [name], MD;  Location: [facility];  Service: General;  Laterality: Left;   FOOT SURGERY     left   PORT-A-CATH REMOVAL N/A 02/02/2021   Procedure: REMOVAL PORT-A-CATH;  Surgeon: [name], MD;  Location: [facility];  Service: General;  Laterality: N/A;   PORTACATH PLACEMENT Left 07/27/2020   Procedure: INSERTION PORT-A-CATH;  Surgeon: Barry Dienes,  Dorris Fetch, MD;  Location: [facility];  Service: General;  Laterality: Left;   RE-EXCISION OF BREAST LUMPECTOMY Left 07/27/2020   Procedure: RE-EXCISION OF LEFT BREAST LUMPECTOMY;  Surgeon: [name], MD;  Location: [facility];  Service: General;  Laterality: Left;   TUBAL LIGATION     WISDOM TOOTH EXTRACTION      I have reviewed the social history and family history with the patient and they are  unchanged from previous note.  ALLERGIES:  is allergic to sulfa antibiotics.  MEDICATIONS:  Current Outpatient Medications  Medication Sig Dispense Refill   acetaminophen (TYLENOL) 325 MG tablet Take 650 mg by mouth every 6 (six) hours as needed.     cyclobenzaprine (FLEXERIL) 10 MG tablet Take 1 tablet (10 mg total) by mouth 3 (three) times daily as needed for muscle spasms. 30 tablet 0   exemestane (AROMASIN) 25 MG tablet TAKE 1 TABLET(25 MG) BY MOUTH DAILY AFTER BREAKFAST 90 tablet 1   lisinopril-hydrochlorothiazide (ZESTORETIC) 20-12.5 MG tablet Take 1 tablet by mouth daily. 90 tablet 3   pantoprazole (PROTONIX) 40 MG tablet Take 1 tablet (40 mg total) by mouth daily. 30 tablet 3   prochlorperazine (COMPAZINE) 10 MG tablet Take 1 tablet (10 mg total) by mouth every 6 (six) hours as needed for nausea or vomiting (headache). 30 tablet 0   scopolamine (TRANSDERM-SCOP) 1 MG/3DAYS Place 1 patch (1.5 mg total) onto the skin every 3 (three) days. 4 patch 0   No current facility-administered medications for this visit.    PHYSICAL EXAMINATION: ECOG PERFORMANCE STATUS: {CHL ONC ECOG PS:[phone]}  There were no vitals filed for this visit. There were no vitals filed for this visit.  GENERAL:alert, no distress and comfortable SKIN: skin color, texture, turgor are normal, no rashes or significant lesions EYES: normal, Conjunctiva are pink and non-injected, sclera clear OROPHARYNX:no exudate, no erythema and lips, buccal mucosa, and tongue normal  NECK: supple, thyroid normal size, non-tender, without nodularity LYMPH:  no palpable lymphadenopathy in the cervical, axillary or inguinal LUNGS: clear to auscultation and percussion with normal breathing effort HEART: regular rate & rhythm and no murmurs and no lower extremity edema ABDOMEN:abdomen soft, non-tender and normal bowel sounds Musculoskeletal:no cyanosis of digits and no clubbing  NEURO: alert & oriented x 3 with fluent speech, no  focal motor/sensory deficits  LABORATORY DATA:  I have reviewed the data as listed    Latest Ref Rng & Units 08/16/2021    8:14 AM 07/25/2021   11:59 AM 05/03/2021   12:19 PM  CBC  WBC 4.0 - 10.5 K/uL 4.2  4.8  6.6   Hemoglobin 12.0 - 15.0 g/dL 13.2  13.1  12.8   Hematocrit 36.0 - 46.0 % 39.9  40.0  38.1   Platelets 150 - 400 K/uL 229  228.0  268         Latest Ref Rng & Units 08/16/2021    8:14 AM 07/25/2021   11:59 AM 05/03/2021   12:19 PM  CMP  Glucose 70 - 99 mg/dL 114  113  103   BUN 6 - 20 mg/dL _0 Creatinine 0.44 - 1.00 mg/dL 0.72  0.88  0.73   Sodium 135 - 145 mmol/L 140  138  140   Potassium 3.5 - 5.1 mmol/L 4.4  3.8  4.0   Chloride 98 - 111 mmol/L 106  104  106   CO2 22 - 32 mmol/L _1 Calcium 8.9 -  10.3 mg/dL 9.3  9.4  9.5   Total Protein 6.5 - 8.1 g/dL 7.0  6.9  6.8   Total Bilirubin 0.3 - 1.2 mg/dL 0.3  0.3  0.4   Alkaline Phos 38 - 126 U/L 93  78  73   AST 15 - 41 U/L _0 ALT 0 - 44 U/L _1 RADIOGRAPHIC STUDIES: I have personally reviewed the radiological images as listed and agreed with the findings in the report. No results found.   ASSESSMENT & PLAN:  No problem-specific Assessment & Plan notes found for this encounter.   No orders of the defined types were placed in this encounter.  All questions were answered. The patient knows to call the clinic with any problems, questions or concerns. No barriers to learning was detected. I spent {CHL ONC TIME VISIT - YNWGN:[id number]} counseling the patient face to face. The total time spent in the appointment was {CHL ONC TIME VISIT - QIONG:[id number]} and more than 50% was on counseling and review of test results     Alla Feeling, NP 02/13/22

## 2022-02-14 ENCOUNTER — Inpatient Hospital Stay: Payer: BC Managed Care – PPO | Attending: Hematology

## 2022-02-14 ENCOUNTER — Inpatient Hospital Stay: Payer: BC Managed Care – PPO | Admitting: Nurse Practitioner

## 2022-03-01 ENCOUNTER — Inpatient Hospital Stay: Payer: BC Managed Care – PPO | Admitting: Nurse Practitioner

## 2022-03-01 ENCOUNTER — Inpatient Hospital Stay: Payer: BC Managed Care – PPO

## 2022-03-04 NOTE — Progress Notes (Unsigned)
[place]   Telephone:([phone]) [phone] Fax:(336) [phone]   Clinic Follow up Note   Patient Care Team: Sherry Barrack, MD as PCP - General (Family Medicine) Sherry Kaufmann, RN as Oncology Nurse Navigator Sherry Germany, RN as Oncology Nurse Navigator Sherry Klein, MD as Consulting Physician (General Surgery) Sherry Rudd, MD as Consulting Physician ([place]) Sherry Merle, MD as Consulting Physician (Hematology) Sherry Feeling, NP as Nurse Practitioner (Nurse Practitioner) Sherry Regal, PA-C as Physician Assistant (Obstetrics and Gynecology) 03/05/2022  CHIEF COMPLAINT: Follow up left breast cancer   SUMMARY OF ONCOLOGIC HISTORY: Oncology History Overview Note  Cancer Staging Malignant neoplasm of upper-outer quadrant of left breast, estrogen receptor positive ([place]) Staging form: Breast, AJCC 8th Edition - Clinical stage from 05/31/2020: Stage IIA (cT2, cN0, cM0, G3, ER+, PR+, HER2-) - Signed by Sherry Merle, MD on 06/01/2020 Stage prefix: Initial diagnosis    Malignant neoplasm of upper-outer quadrant of left breast, estrogen receptor positive ([place])  05/17/2020 Mammogram   IMPRESSION: 1. Highly suspicious 2.3 cm mass involving the UPPER OUTER QUADRANT of the LEFT breast at the 1 o'clock position approximately 8 cm from the nipple. 2. No pathologic LEFT axillary lymphadenopathy. 3. No mammographic evidence of malignancy involving the RIGHT breast.   05/17/2020 Initial Biopsy   Diagnosis Breast, left, needle core biopsy, 1 o'clock - INVASIVE MAMMARY CARCINOMA.  Microscopic Comment The carcinoma appears grade 3. The greatest linear extent of tumor in any one core is 10 mm. E-cadherin will be reported separately. Ancillary studies will be reported separately. Results reported to [facility] on 05/18/2020. Dr. Tresa Moore reviewed the case. ADDENDUM: Immunohistochemistry for E-cadherin is positive supporting a ductal phenotype.     05/17/2020 Receptors her2   PROGNOSTIC INDICATORS Results: IMMUNOHISTOCHEMICAL AND MORPHOMETRIC ANALYSIS PERFORMED MANUALLY The tumor cells are NEGATIVE for Her2 (1+). Estrogen Receptor: 90%, POSITIVE, STRONG STAINING INTENSITY Progesterone Receptor: 60%, POSITIVE, STRONG STAINING INTENSITY Proliferation Marker Ki67: 70%   05/31/2020 Initial Diagnosis   Malignant neoplasm of upper-outer quadrant of left breast, estrogen receptor positive ([place])   05/31/2020 Cancer Staging   Staging form: Breast, AJCC 8th Edition - Clinical stage from 05/31/2020: Stage IIA (cT2, cN0, cM0, G3, ER+, PR+, HER2-) - Signed by Sherry Merle, MD on 06/01/2020 Stage prefix: Initial diagnosis   06/03/2020 Cancer Staging   Staging form: Breast, AJCC 8th Edition - Pathologic stage from 06/03/2020: Stage IIA (pT2, pN1a, cM0, G3, ER+, PR+, HER2-) - Signed by Sherry Merle, MD on 06/29/2020 Stage prefix: Initial diagnosis Multigene prognostic tests performed: MammaPrint Histologic grading system: 3 grade system   06/03/2020 Surgery   Left breast lumpectomy with left axillary sentinel lymph node  biopsies  A. 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  - High grade ductal carcinoma in situ.  - Invasive carcinoma is at the inferior margin focally and <0.1 cm from  the anterior margin focally.  - In situ carcinoma is <0.1 cm from the anterior margin focally.  - Biopsy site.  - See oncology table.   B. 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   C. LYMPH NODE, LEFT AXILLARY, #2, SENTINEL, EXCISION:  - One of one lymph nodes negative for carcinoma (0/1).   06/03/2020 Miscellaneous   Mammaprint - HIGH RISK   07/20/2020 Imaging   CT CAP  IMPRESSION: 1. Surgical clips in the area of the LEFT breast laterally along  the margin of water density well-circumscribed area that measures approximately 4.2 x 3.7 cm. This is likely a  postoperative seroma. Correlate with any pain or developing symptoms that would suggest infection. No gas or secondary findings to suggest this at this time. 2. No evidence of metastatic disease in the chest, abdomen or pelvis. 3. Hepatic steatosis without focal lesion. 4. Calcified coronary artery disease. 5. Aortic atherosclerosis. 6. Mildly nodular thyroid. 7. Lesion in the RIGHT thyroid measuring up to 1.8 cm. Recommend thyroid US (ref: J Am Coll Radiol. 2015 Feb;12(2): 143-50).     07/20/2020 Imaging   Bone Scan  IMPRESSION: Signs of spondylosis of the thoracic and lumbar spine. No scintigraphic evidence of metastatic disease.     07/27/2020 Surgery   RE-EXCISION OF LEFT BREAST LUMPECTOMY and PAC placement by Dr Barry Dienes  FINAL MICROSCOPIC DIAGNOSIS:   A. BREAST, LEFT ANTERIOR MARGIN, EXCISION:  - Prior procedure site changes.  No carcinoma identified.   B. BREAST, LEFT INFERIOR MARGIN, EXCISION:  - Prior procedure site changes.  No carcinoma identified.    08/18/2020 - 10/20/2020 Chemotherapy   docetaxel and Cytoxan (TC) Q3weeks for 4 cycles.     12/13/2020 - 01/27/2021 Radiation Therapy   Site Technique Total Dose (Gy) Dose per Fx (Gy) Completed Fx Beam Energies  Breast, Left: Breast_Lt 3D 50.4/50.4 1.8 28/28 10X  Breast, Left: Breast_Lt_SCLV 3D 50.4/50.4 1.8 28/28 6X, 10X  Breast, Left: Breast_Lt_Bst 3D 10/10 2 5/5 10X      02/23/2021 -  Anti-estrogen oral therapy   Anastrozole   05/03/2021 Survivorship   SCP delivered virtually by Cira Rue, NP     CURRENT THERAPY: Anti-estrogen therapy starting 02/2021, currently on exemestane since 07/2021  INTERVAL HISTORY: [name] returns for follow up as scheduled, last seen by Dr. Burr Medico 11/17/21.  She feels well today without any complaints or changes in her health.  She continues exemestane, rating well.  Hot flashes have improved, now mild and mainly at night.  Denies concerns in her breast such as new lump/mass, nipple  discharge or inversion, or skin change.  Denies residual side effects from chemo, bone pain, new cough, dyspnea, or any other new specific complaints.  Due mammogram in March.  All other systems were reviewed with the patient and are negative.  MEDICAL HISTORY:  Past Medical History:  Diagnosis Date   Abnormal pap 2009   PT HAD SURGERY BUT DON'T KNOW WHAT TYPE   Breast cancer ([place]) 04/27/2020   COVID 2021   GERD (gastroesophageal reflux disease)    Headache(784.0)    otc meds prn   Hypertension    Hyperthyroidism    Personal history of chemotherapy    Personal history of radiation therapy    SVD (spontaneous vaginal delivery)    x 2    SURGICAL HISTORY: Past Surgical History:  Procedure Laterality Date   BREAST LUMPECTOMY     BREAST LUMPECTOMY WITH RADIOACTIVE SEED AND SENTINEL LYMPH NODE BIOPSY Left 06/03/2020   Procedure: RADIOACTIVE SEED GUIDED LEFT BREAST LUMPECTOMY, LEFT AXILLARY SENTINEL LYMPH NODE BIOPSY;  Surgeon: Sherry Klein, MD;  Location: [place];  Service: General;  Laterality: Left;   FOOT SURGERY     left   PORT-A-CATH REMOVAL N/A 02/02/2021   Procedure: REMOVAL PORT-A-CATH;  Surgeon: Sherry Klein, MD;  Location: [place];  Service: General;  Laterality: N/A;   PORTACATH PLACEMENT Left 07/27/2020   Procedure: INSERTION PORT-A-CATH;  Surgeon: Sherry Klein, MD;  Location: [place];  Service: General;  Laterality: Left;  RE-EXCISION OF BREAST LUMPECTOMY Left 07/27/2020   Procedure: RE-EXCISION OF LEFT BREAST LUMPECTOMY;  Surgeon: Sherry Klein, MD;  Location: [place];  Service: General;  Laterality: Left;   TUBAL LIGATION     WISDOM TOOTH EXTRACTION      I have reviewed the social history and family history with the patient and they are unchanged from previous note.  ALLERGIES:  is allergic to sulfa antibiotics.  MEDICATIONS:  Current Outpatient Medications  Medication Sig Dispense Refill   exemestane (AROMASIN) 25 MG tablet TAKE 1 TABLET(25 MG) BY MOUTH DAILY  AFTER BREAKFAST 90 tablet 1   lisinopril-hydrochlorothiazide (ZESTORETIC) 20-12.5 MG tablet Take 1 tablet by mouth daily. 90 tablet 3   pantoprazole (PROTONIX) 40 MG tablet Take 1 tablet (40 mg total) by mouth daily. 30 tablet 3   prochlorperazine (COMPAZINE) 10 MG tablet Take 1 tablet (10 mg total) by mouth every 6 (six) hours as needed for nausea or vomiting (headache). 30 tablet 0   acetaminophen (TYLENOL) 325 MG tablet Take 650 mg by mouth every 6 (six) hours as needed. (Patient not taking: Reported on 03/05/2022)     cyclobenzaprine (FLEXERIL) 10 MG tablet Take 1 tablet (10 mg total) by mouth 3 (three) times daily as needed for muscle spasms. (Patient not taking: Reported on 03/05/2022) 30 tablet 0   scopolamine (TRANSDERM-SCOP) 1 MG/3DAYS Place 1 patch (1.5 mg total) onto the skin every 3 (three) days. (Patient not taking: Reported on 03/05/2022) 4 patch 0   No current facility-administered medications for this visit.    PHYSICAL EXAMINATION: ECOG PERFORMANCE STATUS: 0 - Asymptomatic  Vitals:   03/05/22 0903  BP: 137/81  Pulse: 67  Resp: 18  Temp: 98.6 F (37 C)  SpO2: 100%   Filed Weights   03/05/22 0903  Weight: 171 lb 11.2 oz (77.9 kg)    GENERAL:alert, no distress and comfortable SKIN: No rash EYES: sclera clear NECK: Without mass LYMPH:  no palpable cervical or supraclavicular lymphadenopathy LUNGS: clear with normal breathing effort HEART: regular rate & rhythm, no lower extremity edema ABDOMEN:abdomen soft, non-tender and normal bowel sounds NEURO: alert & oriented x 3 with fluent speech, no focal motor/sensory deficits Breast exam: Symmetrical without nipple discharge or inversion.  S/p left lumpectomy, incisions completely healed with mild scar tissue.  No palpable mass or nodularity in either breast or axilla that I could appreciate.  LABORATORY DATA:  I have reviewed the data as listed    Latest Ref Rng & Units 03/05/2022    8:44 AM 08/16/2021    8:14 AM  07/25/2021   11:59 AM  CBC  WBC 4.0 - 10.5 K/uL 4.4  4.2  4.8   Hemoglobin 12.0 - 15.0 g/dL 13.4  13.2  13.1   Hematocrit 36.0 - 46.0 % 40.9  39.9  40.0   Platelets 150 - 400 K/uL 237  229  228.0         Latest Ref Rng & Units 03/05/2022    8:44 AM 08/16/2021    8:14 AM 07/25/2021   11:59 AM  CMP  Glucose 70 - 99 mg/dL 100  114  113   BUN 6 - 20 mg/dL _0 Creatinine 0.44 - 1.00 mg/dL 0.64  0.72  0.88   Sodium 135 - 145 mmol/L 142  140  138   Potassium 3.5 - 5.1 mmol/L 4.1  4.4  3.8   Chloride 98 - 111 mmol/L 108  106  104   CO2  22 - 32 mmol/L _0 Calcium 8.9 - 10.3 mg/dL 9.7  9.3  9.4   Total Protein 6.5 - 8.1 g/dL 6.7  7.0  6.9   Total Bilirubin 0.3 - 1.2 mg/dL 0.4  0.3  0.3   Alkaline Phos 38 - 126 U/L 79  93  78   AST 15 - 41 U/L _1 ALT 0 - 44 U/L _2 RADIOGRAPHIC STUDIES: I have personally reviewed the radiological images as listed and agreed with the findings in the report. No results found.   ASSESSMENT & PLAN: JOYLENE Carrillo is a 56 y.o. female with    1. Malignant neoplasm of upper-outer quadrant of left breast, IDC, Stage IIA, pT2N1M0, stage IIA, ER+/PR+/HER2-, Grade III, Mammaprint high risk -Diagnosed 04/2020, s/p Left breast lumpectomy on 06/03/20 under Dr. Barry Dienes and re-excision, final margin was negative.  -Staging workup was negative for distant mets. -S/p chemo with 4 cycles TC 08/18/20 - 10/20/20 and adjuvant radiation 12/13/20 - 01/27/21. -She began antiestrogen therapy initially with anastrozole in 02/2021, tolerated poorly with fatigue, weakness, whole-body aches, and general malaise.  Switch to exemestane in 07/2021 which she is tolerating much better  -Ms. Salmon is clinically doing well.  Tolerating exemestane very well, only mild hot flashes at night.  Breast exam is benign, labs are unremarkable.  Overall no clinical concern for breast cancer recurrence -Continue surveillance and AI. -B/l mammogram and DEXA due 05/2022,  orders placed -Follow-up in 6 months, or sooner if needed   2. Hot flashes -Post-menopausal on 12/2017 labs with Gyn Dr Sherry Carrillo -She started having hot flashes in Fall 2021. Her gyn started on her Estradial patch in 04/2020 which helped very well. Due to ER/PR positive breast cancer, she stopped in March 2022.  -She experienced vomiting with Effexor. -she was previously on gabapentin, but does not need it anymore -Now mild, mainly at night; tolerable without medication   3. Bone Health -Baseline DEXA due with mammogram in March   Plan: -Labs reviewed -Continue breast cancer surveillance and exemestane -Mammogram and DEXA due 05/2022, orders placed today -Follow-up in 6 months, or sooner if needed   Orders Placed This Encounter  Procedures   MM DIAG BREAST TOMO BILATERAL    Standing Status:   Future    Standing Expiration Date:   03/06/2023    Order Specific Question:   Reason for Exam (SYMPTOM  OR DIAGNOSIS REQUIRED)    Answer:   h/o left breast cancer    Order Specific Question:   Is the patient pregnant?    Answer:   No    Order Specific Question:   Preferred imaging location?    Answer:   [facility]   DG Bone Density    Standing Status:   Future    Standing Expiration Date:   03/05/2023    Order Specific Question:   Reason for Exam (SYMPTOM  OR DIAGNOSIS REQUIRED)    Answer:   screening, on AI for h/o breast cancer    Order Specific Question:   Is the patient pregnant?    Answer:   No    Order Specific Question:   Preferred imaging location?    Answer:   [facility]   All questions were answered. The patient knows to call the clinic with any problems, questions or concerns. No barriers to learning were detected.  Sherry Feeling, NP 03/05/22

## 2022-03-05 ENCOUNTER — Other Ambulatory Visit: Payer: Self-pay

## 2022-03-05 ENCOUNTER — Inpatient Hospital Stay (HOSPITAL_BASED_OUTPATIENT_CLINIC_OR_DEPARTMENT_OTHER): Payer: BC Managed Care – PPO | Admitting: Nurse Practitioner

## 2022-03-05 ENCOUNTER — Inpatient Hospital Stay: Payer: BC Managed Care – PPO | Attending: Hematology

## 2022-03-05 ENCOUNTER — Encounter: Payer: Self-pay | Admitting: Nurse Practitioner

## 2022-03-05 VITALS — BP 137/81 | HR 67 | Temp 98.6°F | Resp 18 | Ht 60.0 in | Wt 171.7 lb

## 2022-03-05 DIAGNOSIS — Z170 Estrogen receptor positive status [ER+]: Secondary | ICD-10-CM | POA: Diagnosis not present

## 2022-03-05 DIAGNOSIS — C50412 Malignant neoplasm of upper-outer quadrant of left female breast: Secondary | ICD-10-CM | POA: Insufficient documentation

## 2022-03-05 DIAGNOSIS — Z79811 Long term (current) use of aromatase inhibitors: Secondary | ICD-10-CM | POA: Diagnosis not present

## 2022-03-05 DIAGNOSIS — I10 Essential (primary) hypertension: Secondary | ICD-10-CM | POA: Insufficient documentation

## 2022-03-05 LAB — CBC WITH DIFFERENTIAL (CANCER CENTER ONLY)
Abs Immature Granulocytes: 0.01 10*3/uL (ref 0.00–0.07)
Basophils Absolute: 0 10*3/uL (ref 0.0–0.1)
Basophils Relative: 1 %
Eosinophils Absolute: 0.1 10*3/uL (ref 0.0–0.5)
Eosinophils Relative: 2 %
HCT: 40.9 % (ref 36.0–46.0)
Hemoglobin: 13.4 g/dL (ref 12.0–15.0)
Immature Granulocytes: 0 %
Lymphocytes Relative: 29 %
Lymphs Abs: 1.3 10*3/uL (ref 0.7–4.0)
MCH: 28.3 pg (ref 26.0–34.0)
MCHC: 32.8 g/dL (ref 30.0–36.0)
MCV: 86.3 fL (ref 80.0–100.0)
Monocytes Absolute: 0.3 10*3/uL (ref 0.1–1.0)
Monocytes Relative: 7 %
Neutro Abs: 2.7 10*3/uL (ref 1.7–7.7)
Neutrophils Relative %: 61 %
Platelet Count: 237 10*3/uL (ref 150–400)
RBC: 4.74 MIL/uL (ref 3.87–5.11)
RDW: 13.1 % (ref 11.5–15.5)
WBC Count: 4.4 10*3/uL (ref 4.0–10.5)
nRBC: 0 % (ref 0.0–0.2)

## 2022-03-05 LAB — CMP (CANCER CENTER ONLY)
ALT: 14 U/L (ref 0–44)
AST: 14 U/L — ABNORMAL LOW (ref 15–41)
Albumin: 3.9 g/dL (ref 3.5–5.0)
Alkaline Phosphatase: 79 U/L (ref 38–126)
Anion gap: 7 (ref 5–15)
BUN: 19 mg/dL (ref 6–20)
CO2: 27 mmol/L (ref 22–32)
Calcium: 9.7 mg/dL (ref 8.9–10.3)
Chloride: 108 mmol/L (ref 98–111)
Creatinine: 0.64 mg/dL (ref 0.44–1.00)
GFR, Estimated: >60 mL/min (ref >60)
Glucose, Bld: 100 mg/dL — ABNORMAL HIGH (ref 70–99)
Potassium: 4.1 mmol/L (ref 3.5–5.1)
Sodium: 142 mmol/L (ref 135–145)
Total Bilirubin: 0.4 mg/dL (ref 0.3–1.2)
Total Protein: 6.7 g/dL (ref 6.5–8.1)

## 2022-06-19 ENCOUNTER — Other Ambulatory Visit: Payer: Self-pay | Admitting: Hematology

## 2022-06-21 ENCOUNTER — Other Ambulatory Visit: Payer: Self-pay

## 2022-06-21 ENCOUNTER — Encounter: Payer: Self-pay | Admitting: Hematology

## 2022-06-21 ENCOUNTER — Ambulatory Visit
Admission: RE | Admit: 2022-06-21 | Discharge: 2022-06-21 | Disposition: A | Discharge status: Home / Self Care | Payer: BC Managed Care – PPO | Source: Ambulatory Visit | Attending: Nurse Practitioner | Admitting: Nurse Practitioner

## 2022-06-21 DIAGNOSIS — Z170 Estrogen receptor positive status [ER+]: Secondary | ICD-10-CM

## 2022-07-31 ENCOUNTER — Encounter: Payer: BC Managed Care – PPO | Admitting: Family Medicine

## 2022-08-01 ENCOUNTER — Other Ambulatory Visit: Payer: Self-pay | Admitting: Hematology and Oncology

## 2022-08-02 ENCOUNTER — Ambulatory Visit (INDEPENDENT_AMBULATORY_CARE_PROVIDER_SITE_OTHER): Payer: BC Managed Care – PPO | Admitting: Family Medicine

## 2022-08-02 ENCOUNTER — Encounter: Payer: Self-pay | Admitting: Family Medicine

## 2022-08-02 VITALS — BP 119/78 | HR 72 | Temp 97.3°F | Ht 60.0 in | Wt 167.6 lb

## 2022-08-02 DIAGNOSIS — I10 Essential (primary) hypertension: Secondary | ICD-10-CM | POA: Diagnosis not present

## 2022-08-02 DIAGNOSIS — R739 Hyperglycemia, unspecified: Secondary | ICD-10-CM | POA: Diagnosis not present

## 2022-08-02 DIAGNOSIS — M654 Radial styloid tenosynovitis [de Quervain]: Secondary | ICD-10-CM | POA: Diagnosis not present

## 2022-08-02 DIAGNOSIS — M1990 Unspecified osteoarthritis, unspecified site: Secondary | ICD-10-CM | POA: Insufficient documentation

## 2022-08-02 MED ORDER — METHYLPREDNISOLONE ACETATE 40 MG/ML IJ SUSP
40.0000 mg | Freq: Once | INTRAMUSCULAR | Status: AC
Start: 2022-08-02 — End: 2022-08-02
  Administered 2022-08-02: 40 mg via INTRA_ARTICULAR

## 2022-08-02 MED ORDER — PANTOPRAZOLE SODIUM 40 MG PO TBEC: 40.0000 mg | DELAYED_RELEASE_TABLET | Freq: Every day | ORAL | 3 refills | Status: AC

## 2022-08-02 NOTE — Assessment & Plan Note (Signed)
Blood pressure at goal on lisinopril-HCTZ 20-12 0.5 once daily.

## 2022-08-02 NOTE — Assessment & Plan Note (Signed)
No prior history of diabetes.  Recent glucoses have been within acceptable ranges.  She will come back in a few weeks to recheck A1c with CPE.  Do not anticipate she will have any issues with above steroid injection today.

## 2022-08-02 NOTE — Patient Instructions (Signed)
It was very nice to see you today!  We gave you a steroid injection today.  Please use the brace.  Work on the exercises.  Let us know if not improving over the next few weeks.  No follow-ups on file.   Take care, Dr Jimmey Ralph  PLEASE NOTE:  If you had any lab tests, please let us know if you have not heard back within a few days. You may see your results on mychart before we have a chance to review them but we will give you a call once they are reviewed by [place].   If we ordered any referrals today, please let us know if you have not heard from their office within the next week.   If you had any urgent prescriptions sent in today, please check with the pharmacy within an hour of our visit to make sure the prescription was transmitted appropriately.   Please try these tips to maintain a healthy lifestyle:  Eat at least 3 REAL meals and 1-2 snacks per day.  Aim for no more than 5 hours between eating.  If you eat breakfast, please do so within one hour of getting up.   Each meal should contain half fruits/vegetables, one quarter protein, and one quarter carbs (no bigger than a computer mouse)  Cut down on sweet beverages. This includes juice, soda, and sweet tea.   Drink at least 1 glass of water with each meal and aim for at least 8 glasses per day  Exercise at least 150 minutes every week.

## 2022-08-02 NOTE — Progress Notes (Signed)
   Sherry Carrillo is a 57 y.o. female who presents today for an office visit.  Assessment/Plan:  New/Acute Problems: Left Hand Pain Exam and history consistent with de Quervain's tenosynovitis.  She has had some improvement with conservative management though is still having ongoing issues that are still bothersome.  We discussed treatment options.  She is interested in a steroid injection today.  This was performed.  See below procedure note.  She tolerated well.  We also discussed home exercises and handout was given.  She has a spica splint at home she will continue using as well.  She will let us know if not proving in the next few weeks.  Would consider referral to PT or sports medicine at that time.  Chronic Problems Addressed Today: Essential hypertension Blood pressure at goal on lisinopril-HCTZ 20-12 0.5 once daily.  Hyperglycemia No prior history of diabetes.  Recent glucoses have been within acceptable ranges.  She will come back in a few weeks to recheck A1c with CPE.  Do not anticipate she will have any issues with above steroid injection today.     Subjective:  HPI:  See Assessment / plan for status of chronic conditions.  Main concern today is left thumb pain. This started a few weeks ago. Initially went to urgent care and she was prescribed a course of prednisone and told to wear a splint. She was going on vacation and delayed starting her medications until she got back. This did help with her symptoms but it has still symptoms.  Worse with certain motions.  She is interested in getting a steroid shot today.       Objective:  Physical Exam: BP 119/78   Pulse 72   Temp (!) 97.3 F (36.3 C) (Temporal)   Ht 5' (1.524 m)   Wt 167 lb 9.6 oz (76 kg)   SpO2 99%   BMI 32.73 kg/m   Gen: No acute distress, resting comfortably CV: Regular rate and rhythm with no murmurs appreciated Pulm: Normal work of breathing, clear to auscultation bilaterally with no crackles, wheezes, or  rhonchi MUSCULOSKELETAL: - Left Hand: No deformities.  Tenderness to palpation along the extensor tendon of the left first digit.  Positive Finklestein's test. Neuro: Grossly normal, moves all extremities Psych: Normal affect and thought content  Procedure note Verbal consent was obtained.  Area was prepped with Betadine.  Topical ethyl chloride was applied for anesthesia.  A 25-gauge needle was inserted into the first extensor compartment with care to avoid the abductor and extensor tendons as well as any vasculature.  A mixture of 2 mL of 2% lidocaine without epinephrine and 1 mL of 40 mg/cc was then infiltrated into the area.  Needle was withdrawn.  Sterile bandage was placed.  Se tolerated well.  No complications.  Minimal blood loss.      Katina Degree. Jimmey Ralph, MD 08/02/2022 1:54 PM

## 2022-08-16 ENCOUNTER — Encounter: Payer: BC Managed Care – PPO | Admitting: Family Medicine

## 2022-08-28 ENCOUNTER — Other Ambulatory Visit: Payer: BC Managed Care – PPO

## 2022-08-28 ENCOUNTER — Ambulatory Visit
Admission: RE | Admit: 2022-08-28 | Discharge: 2022-08-28 | Disposition: A | Discharge status: Home / Self Care | Payer: BC Managed Care – PPO | Source: Ambulatory Visit | Attending: Nurse Practitioner | Admitting: Nurse Practitioner

## 2022-08-28 DIAGNOSIS — Z170 Estrogen receptor positive status [ER+]: Secondary | ICD-10-CM

## 2022-08-29 ENCOUNTER — Telehealth: Payer: Self-pay

## 2022-08-29 NOTE — Telephone Encounter (Addendum)
Called patient to relay message below as per Santiago Glad NP. Patient voiced full understanding .  ----- Message from Pollyann Samples, NP sent at 08/29/2022  8:20 AM EDT ----- Please let pt know DEXA shows osteopenia, I will review at her visit on Monday. In the meantime, please have her begin daily calcium/vit D supplement.   Thanks, Clayborn Heron NP

## 2022-08-31 ENCOUNTER — Other Ambulatory Visit: Payer: Self-pay

## 2022-08-31 ENCOUNTER — Other Ambulatory Visit: Payer: Self-pay | Admitting: Medical Oncology

## 2022-08-31 DIAGNOSIS — Z170 Estrogen receptor positive status [ER+]: Secondary | ICD-10-CM

## 2022-09-02 NOTE — Progress Notes (Deleted)
Patient Care Team: Ardith Dark, MD as PCP - General (Family Medicine) Pershing Proud, RN as Oncology Nurse Navigator Donnelly Angelica, RN as Oncology Nurse Navigator Almond Lint, MD as Consulting Physician (General Surgery) Dorothy Puffer, MD as Consulting Physician (Radiation Oncology) Malachy Mood, MD as Consulting Physician (Hematology) Pollyann Samples, NP as Nurse Practitioner (Nurse Practitioner) Henreitta Leber, PA-C as Physician Assistant (Obstetrics and Gynecology)   CHIEF COMPLAINT: Follow up left breast cancer   Oncology History Overview Note  Cancer Staging Malignant neoplasm of upper-outer quadrant of left breast, estrogen receptor positive [facility]) Staging form: Breast, AJCC 8th Edition - Clinical stage from 05/31/2020: Stage IIA (cT2, cN0, cM0, G3, ER+, PR+, HER2-) - Signed by Malachy Mood, MD on 06/01/2020 Stage prefix: Initial diagnosis    Malignant neoplasm of upper-outer quadrant of left breast, estrogen receptor positive (HCC)  05/17/2020 Mammogram   IMPRESSION: 1. Highly suspicious 2.3 cm mass involving the UPPER OUTER QUADRANT of the LEFT breast at the 1 o'clock position approximately 8 cm from the nipple. 2. No pathologic LEFT axillary lymphadenopathy. 3. No mammographic evidence of malignancy involving the RIGHT breast.   05/17/2020 Initial Biopsy   Diagnosis Breast, left, needle core biopsy, 1 o'clock - INVASIVE MAMMARY CARCINOMA.  Microscopic Comment The carcinoma appears grade 3. The greatest linear extent of tumor in any one core is 10 mm. E-cadherin will be reported separately. Ancillary studies will be reported separately. Results reported to [facility] on 05/18/2020. Dr. Berneice Heinrich reviewed the case. ADDENDUM: Immunohistochemistry for E-cadherin is positive supporting a ductal phenotype.    05/17/2020 Receptors her2   PROGNOSTIC INDICATORS Results: IMMUNOHISTOCHEMICAL AND MORPHOMETRIC ANALYSIS PERFORMED MANUALLY The tumor cells are  NEGATIVE for Her2 (1+). Estrogen Receptor: 90%, POSITIVE, STRONG STAINING INTENSITY Progesterone Receptor: 60%, POSITIVE, STRONG STAINING INTENSITY Proliferation Marker Ki67: 70%   05/31/2020 Initial Diagnosis   Malignant neoplasm of upper-outer quadrant of left breast, estrogen receptor positive (HCC)   05/31/2020 Cancer Staging   Staging form: Breast, AJCC 8th Edition - Clinical stage from 05/31/2020: Stage IIA (cT2, cN0, cM0, G3, ER+, PR+, HER2-) - Signed by Malachy Mood, MD on 06/01/2020 Stage prefix: Initial diagnosis   06/03/2020 Cancer Staging   Staging form: Breast, AJCC 8th Edition - Pathologic stage from 06/03/2020: Stage IIA (pT2, pN1a, cM0, G3, ER+, PR+, HER2-) - Signed by Malachy Mood, MD on 06/29/2020 Stage prefix: Initial diagnosis Multigene prognostic tests performed: MammaPrint Histologic grading system: 3 grade system   06/03/2020 Surgery   Left breast lumpectomy with left axillary sentinel lymph node  biopsies  A. 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  - High grade ductal carcinoma in situ.  - Invasive carcinoma is at the inferior margin focally and <0.1 cm from  the anterior margin focally.  - In situ carcinoma is <0.1 cm from the anterior margin focally.  - Biopsy site.  - See oncology table.   B. 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   C. LYMPH NODE, LEFT AXILLARY, #2, SENTINEL, EXCISION:  - One of one lymph nodes negative for carcinoma (0/1).   06/03/2020 Miscellaneous   Mammaprint - HIGH RISK   07/20/2020 Imaging   CT CAP  IMPRESSION: 1. Surgical clips in the area of the LEFT breast laterally along the margin of water density well-circumscribed area that measures approximately 4.2 x 3.7 cm. This is likely a postoperative seroma.  Correlate with any pain or developing symptoms that would suggest infection. No gas or secondary findings to suggest this at  this time. 2. No evidence of metastatic disease in the chest, abdomen or pelvis. 3. Hepatic steatosis without focal lesion. 4. Calcified coronary artery disease. 5. Aortic atherosclerosis. 6. Mildly nodular thyroid. 7. Lesion in the RIGHT thyroid measuring up to 1.8 cm. Recommend thyroid US (ref: J Am Coll Radiol. 2015 Feb;12(2): 143-50).     07/20/2020 Imaging   Bone Scan  IMPRESSION: Signs of spondylosis of the thoracic and lumbar spine. No scintigraphic evidence of metastatic disease.     07/27/2020 Surgery   RE-EXCISION OF LEFT BREAST LUMPECTOMY and PAC placement by Dr Donell Beers  FINAL MICROSCOPIC DIAGNOSIS:   A. BREAST, LEFT ANTERIOR MARGIN, EXCISION:  - Prior procedure site changes.  No carcinoma identified.   B. BREAST, LEFT INFERIOR MARGIN, EXCISION:  - Prior procedure site changes.  No carcinoma identified.    08/18/2020 - 10/20/2020 Chemotherapy   docetaxel and Cytoxan (TC) Q3weeks for 4 cycles.     12/13/2020 - 01/27/2021 Radiation Therapy   Site Technique Total Dose (Gy) Dose per Fx (Gy) Completed Fx Beam Energies  Breast, Left: Breast_Lt 3D 50.4/50.4 1.8 28/28 10X  Breast, Left: Breast_Lt_SCLV 3D 50.4/50.4 1.8 28/28 6X, 10X  Breast, Left: Breast_Lt_Bst 3D 10/10 2 5/5 10X      02/23/2021 -  Anti-estrogen oral therapy   Anastrozole   05/03/2021 Survivorship   SCP delivered virtually by Santiago Glad, NP      CURRENT THERAPY: Anti-estrogen therapy starting 02/2021, currently on exemestane since 07/2021   INTERVAL HISTORY Sherry Carrillo returns for follow up as scheduled, last seen by me 03/05/22. Mammogram 06/21/22 was benign. DEXA 08/28/22 showed mild osteopenia with low FRAX score. She continues exemestane.   ROS   Past Medical History:  Diagnosis Date   Abnormal pap 2009   PT HAD SURGERY BUT DON'T KNOW WHAT TYPE   Breast cancer (HCC) 04/27/2020   COVID 2021   GERD (gastroesophageal reflux disease)    Headache(784.0)    otc meds prn   Hypertension     Hyperthyroidism    Personal history of chemotherapy    Personal history of radiation therapy    SVD (spontaneous vaginal delivery)    x 2     Past Surgical History:  Procedure Laterality Date   BREAST LUMPECTOMY     BREAST LUMPECTOMY WITH RADIOACTIVE SEED AND SENTINEL LYMPH NODE BIOPSY Left 06/03/2020   Procedure: RADIOACTIVE SEED GUIDED LEFT BREAST LUMPECTOMY, LEFT AXILLARY SENTINEL LYMPH NODE BIOPSY;  Surgeon: Almond Lint, MD;  Location: [facility] OR;  Service: General;  Laterality: Left;   FOOT SURGERY     left   PORT-A-CATH REMOVAL N/A 02/02/2021   Procedure: REMOVAL PORT-A-CATH;  Surgeon: Almond Lint, MD;  Location: [facility] OR;  Service: General;  Laterality: N/A;   PORTACATH PLACEMENT Left 07/27/2020   Procedure: INSERTION PORT-A-CATH;  Surgeon: Almond Lint, MD;  Location: [facility] OR;  Service: General;  Laterality: Left;   RE-EXCISION OF BREAST LUMPECTOMY Left 07/27/2020   Procedure: RE-EXCISION OF LEFT BREAST LUMPECTOMY;  Surgeon: Almond Lint, MD;  Location: [facility] OR;  Service: General;  Laterality: Left;   TUBAL LIGATION     WISDOM TOOTH EXTRACTION       Outpatient Encounter Medications as of 09/03/2022  Medication Sig   acetaminophen (TYLENOL) 325 MG tablet Take 650 mg by mouth every 6 (six) hours as needed.   cyclobenzaprine (FLEXERIL) 10 MG tablet Take 1 tablet (  10 mg total) by mouth 3 (three) times daily as needed for muscle spasms.   exemestane (AROMASIN) 25 MG tablet TAKE 1 TABLET BY MOUTH DAILY  AFTER BREAKFAST   lisinopril-hydrochlorothiazide (ZESTORETIC) 20-12.5 MG tablet Take 1 tablet by mouth daily.   pantoprazole (PROTONIX) 40 MG tablet Take 1 tablet (40 mg total) by mouth daily.   scopolamine (TRANSDERM-SCOP) 1 MG/3DAYS Place 1 patch (1.5 mg total) onto the skin every 3 (three) days.   No facility-administered encounter medications on file as of 09/03/2022.     There were no vitals filed for this visit. There is no height or weight on file to calculate BMI.   PHYSICAL  EXAM GENERAL:alert, no distress and comfortable SKIN: no rash  EYES: sclera clear NECK: without mass LYMPH:  no palpable cervical or supraclavicular lymphadenopathy  LUNGS: clear with normal breathing effort HEART: regular rate & rhythm, no lower extremity edema ABDOMEN: abdomen soft, non-tender and normal bowel sounds NEURO: alert & oriented x 3 with fluent speech, no focal motor/sensory deficits Breast exam:  PAC without erythema    CBC    Component Value Date/Time   WBC 4.4 03/05/2022 0844   WBC 4.8 07/25/2021 1159   RBC 4.74 03/05/2022 0844   HGB 13.4 03/05/2022 0844   HCT 40.9 03/05/2022 0844   PLT 237 03/05/2022 0844   MCV 86.3 03/05/2022 0844   MCH 28.3 03/05/2022 0844   MCHC 32.8 03/05/2022 0844   RDW 13.1 03/05/2022 0844   LYMPHSABS 1.3 03/05/2022 0844   MONOABS 0.3 03/05/2022 0844   EOSABS 0.1 03/05/2022 0844   BASOSABS 0.0 03/05/2022 0844     CMP     Component Value Date/Time   NA 142 03/05/2022 0844   NA 138 05/09/2020 0000   K 4.1 03/05/2022 0844   CL 108 03/05/2022 0844   CO2 27 03/05/2022 0844   GLUCOSE 100 (H) 03/05/2022 0844   BUN 19 03/05/2022 0844   BUN 16 05/09/2020 0000   CREATININE 0.64 03/05/2022 0844   CALCIUM 9.7 03/05/2022 0844   PROT 6.7 03/05/2022 0844   ALBUMIN 3.9 03/05/2022 0844   AST 14 (L) 03/05/2022 0844   ALT 14 03/05/2022 0844   ALKPHOS 79 03/05/2022 0844   BILITOT 0.4 03/05/2022 0844   GFRNONAA >60 03/05/2022 0844   GFRAA 105 05/09/2020 0000     ASSESSMENT & PLAN:Sherry Carrillo is a 57 y.o. female with    1. Malignant neoplasm of upper-outer quadrant of left breast, IDC, Stage IIA, pT2N1M0, stage IIA, ER+/PR+/HER2-, Grade III, Mammaprint high risk -Diagnosed 04/2020, s/p Left breast lumpectomy on 06/03/20 under Dr. Donell Beers and re-excision, final margin was negative.  -Staging workup was negative for distant mets. -S/p chemo with 4 cycles TC 08/18/20 - 10/20/20 and adjuvant radiation 12/13/20 - 01/27/21. -She began antiestrogen  therapy initially with anastrozole in 02/2021, tolerated poorly with fatigue, weakness, whole-body aches, and general malaise.  Switch to exemestane in 07/2021 which she is tolerating much better  -Mammo 05/2022 was benign   2. Hot flashes -Post-menopausal on 12/2017 labs with Gyn Dr Henreitta Leber -She started having hot flashes in Fall 2021. Her gyn started on her Estradial patch in 04/2020 which helped very well. Due to ER/PR positive breast cancer, she stopped in March 2022.  -She experienced vomiting with Effexor. -she was previously on gabapentin, but does not need it anymore -Now mild, mainly at night; tolerable without medication   3. Bone Health -Baseline DEXA 08/28/22 showed osteopenia, lowest T score -1.8,  with low FRAX score.  -Continue calcium/vit D and weight bearing exercise      PLAN:  No orders of the defined types were placed in this encounter.     All questions were answered. The patient knows to call the clinic with any problems, questions or concerns. No barriers to learning were detected. I spent *** counseling the patient face to face. The total time spent in the appointment was *** and more than 50% was on counseling, review of test results, and coordination of care.   Santiago Glad, NP-C @DATE @

## 2022-09-03 ENCOUNTER — Inpatient Hospital Stay: Payer: BC Managed Care – PPO | Attending: Family Medicine

## 2022-09-03 ENCOUNTER — Inpatient Hospital Stay: Payer: BC Managed Care – PPO | Admitting: Nurse Practitioner

## 2022-09-14 IMAGING — MG MM BREAST LOCALIZATION CLIP
4 series · 4 of 12 positions shown · non-contrast
Comparison: Previous exam(s).

CLINICAL DATA: Patient status post ultrasound-guided core needle
biopsy left breast mass.

EXAM:
DIAGNOSTIC LEFT MAMMOGRAM POST ULTRASOUND BIOPSY

[L CC synth-2D]
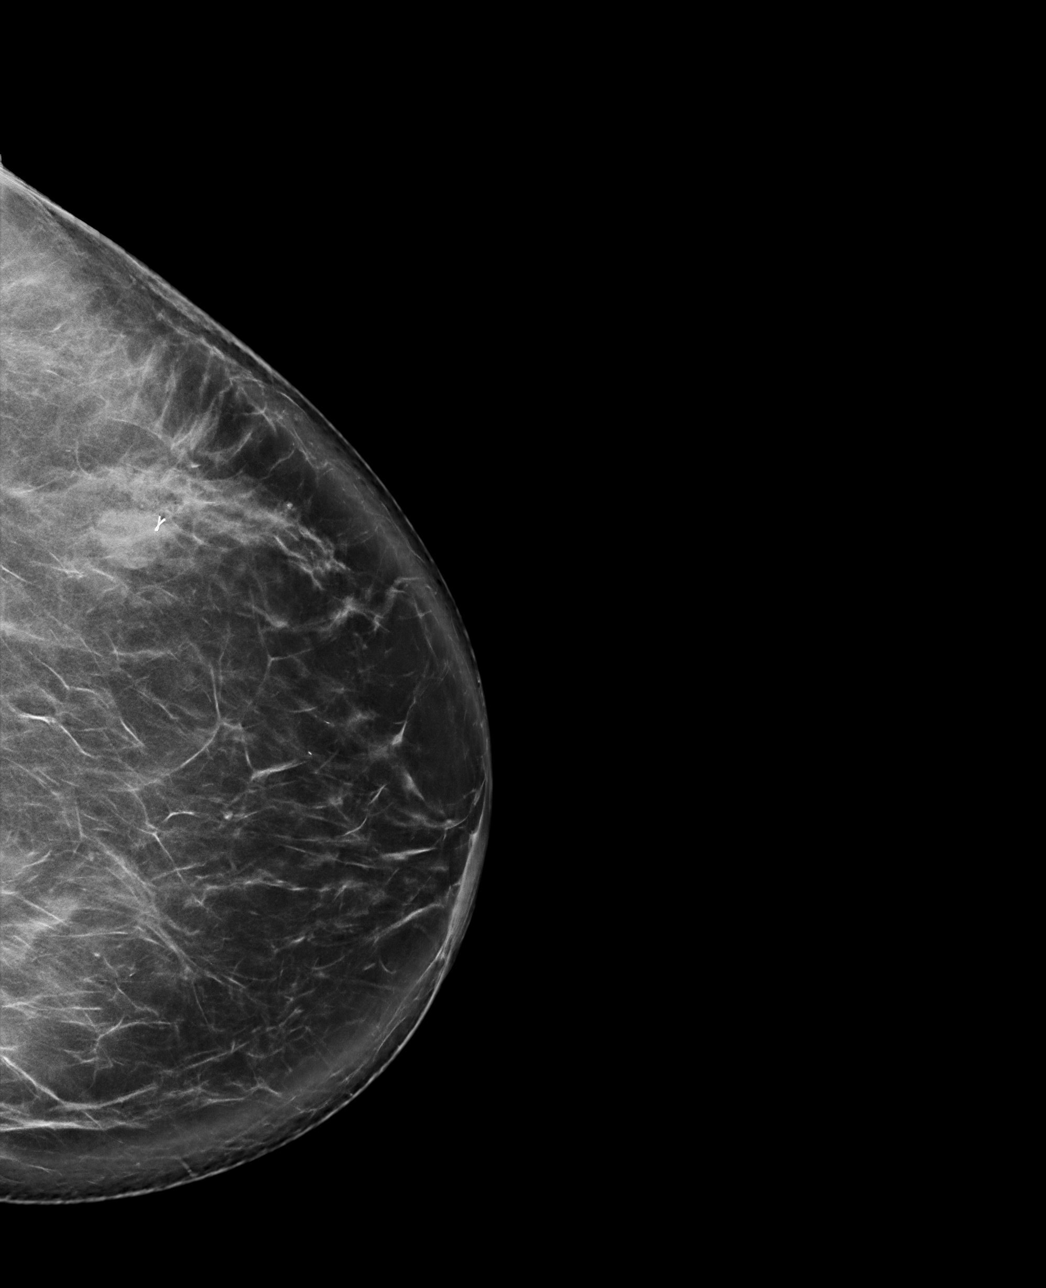

[L ML synth-2D]
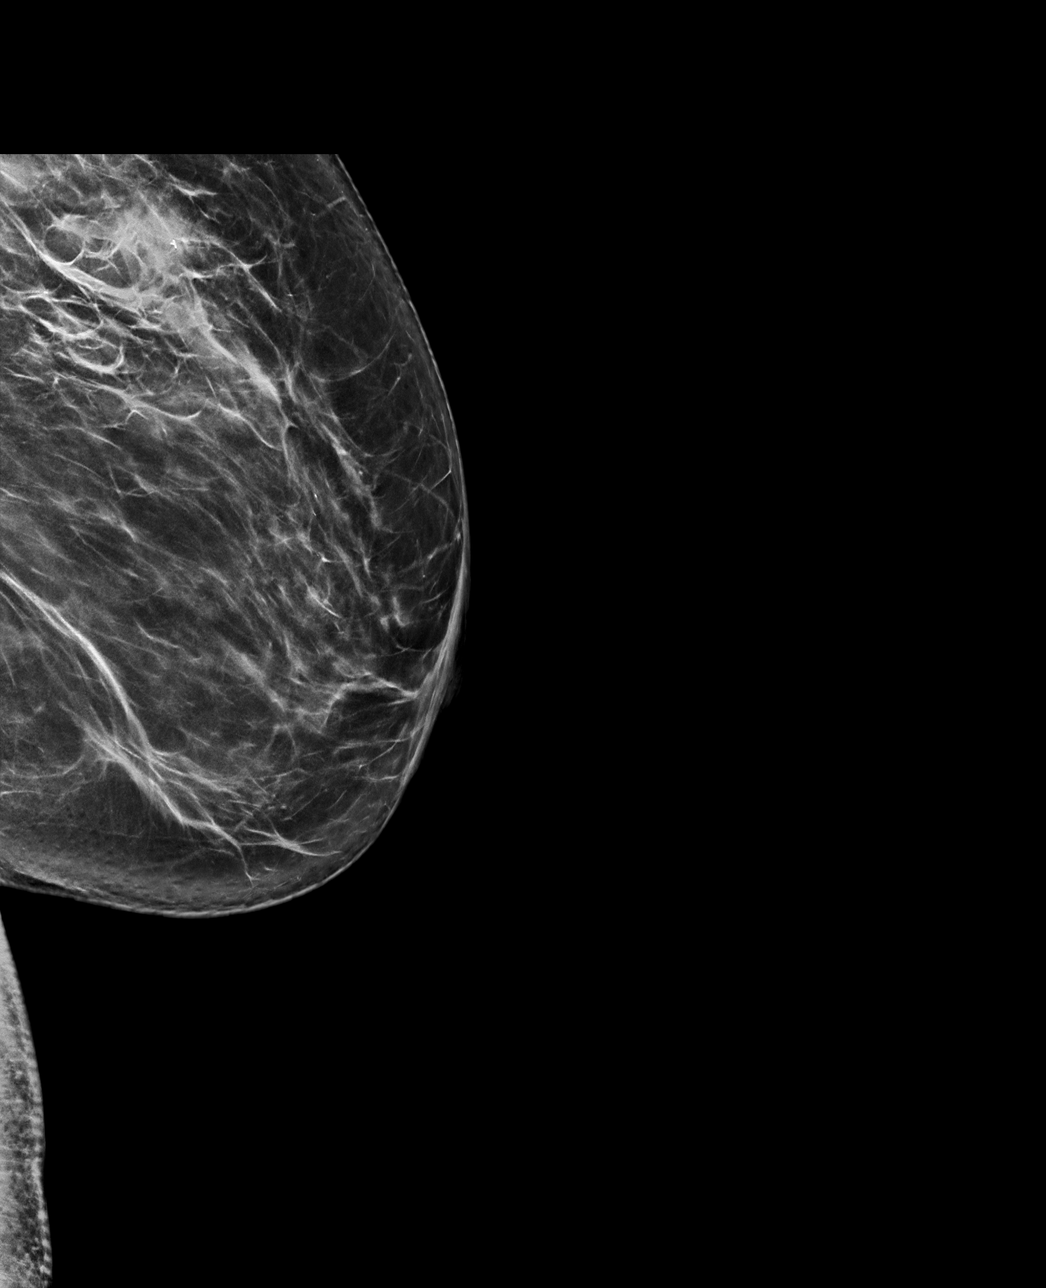

[L CC tomo · tomo slice 49/96.0]
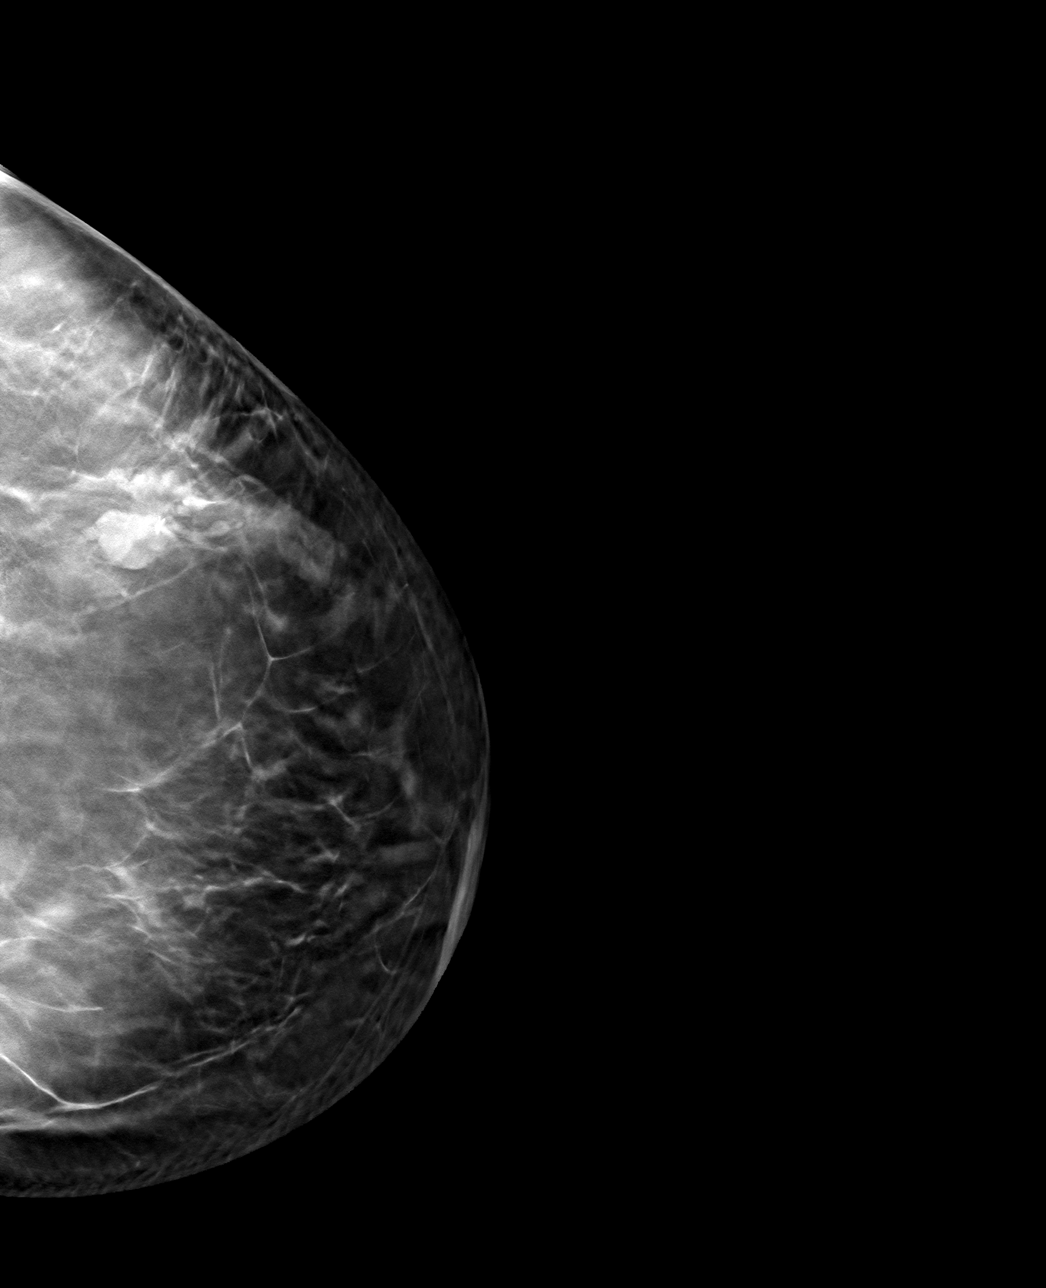

[L ML tomo · tomo slice 43/86.0]
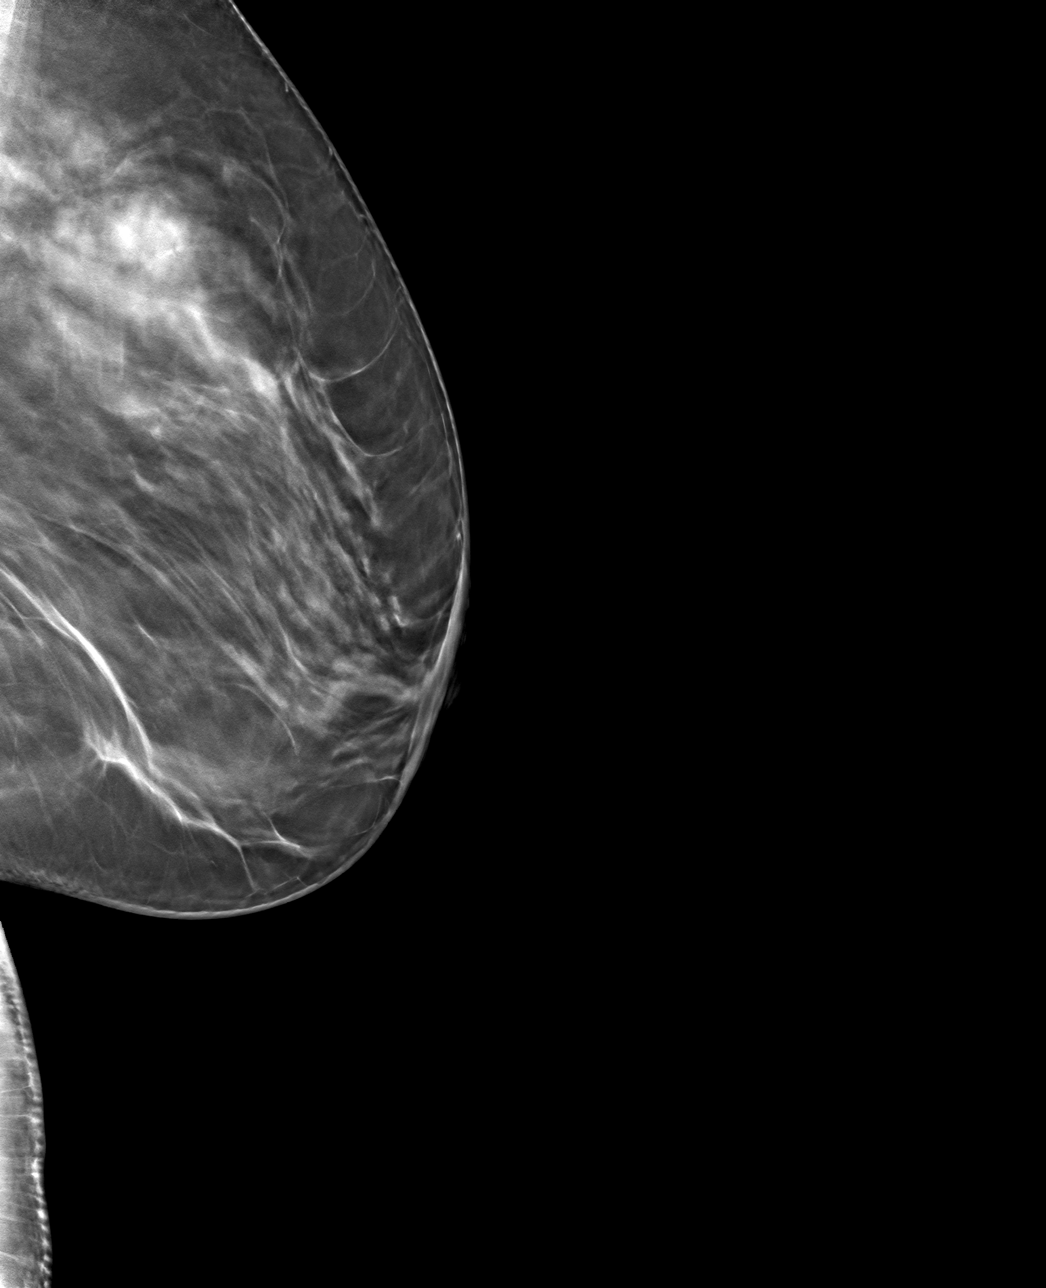

[4 of 12 positions shown; findings below may reference images not displayed]

FINDINGS: Mammographic images were obtained following ultrasound guided biopsy
of left breast mass. The biopsy marking clip is in expected position
at the site of biopsy.
IMPRESSION: Appropriate positioning of the ribbon shaped biopsy marking clip at
the site of biopsy in the left breast 1 o'clock position.

Final Assessment: Post Procedure Mammograms for Marker Placement

## 2022-09-14 IMAGING — US US BREAST BX W LOC DEV 1ST LESION IMG BX SPEC US GUIDE*L*
1 series · 3 of 3 positions shown · non-contrast
Comparison: Previous exam(s).
COMPARISON: Previous exam(s).

Addendum:
CLINICAL DATA: Patient with indeterminate left breast mass 1
o'clock position.

EXAM:
ULTRASOUND GUIDED LEFT BREAST CORE NEEDLE BIOPSY

[Series 1: us breast bx w loc dev 1st lesion img bx spec us g · 0.07mm/px · 3 of 3 slices shown]
[im 1/3]
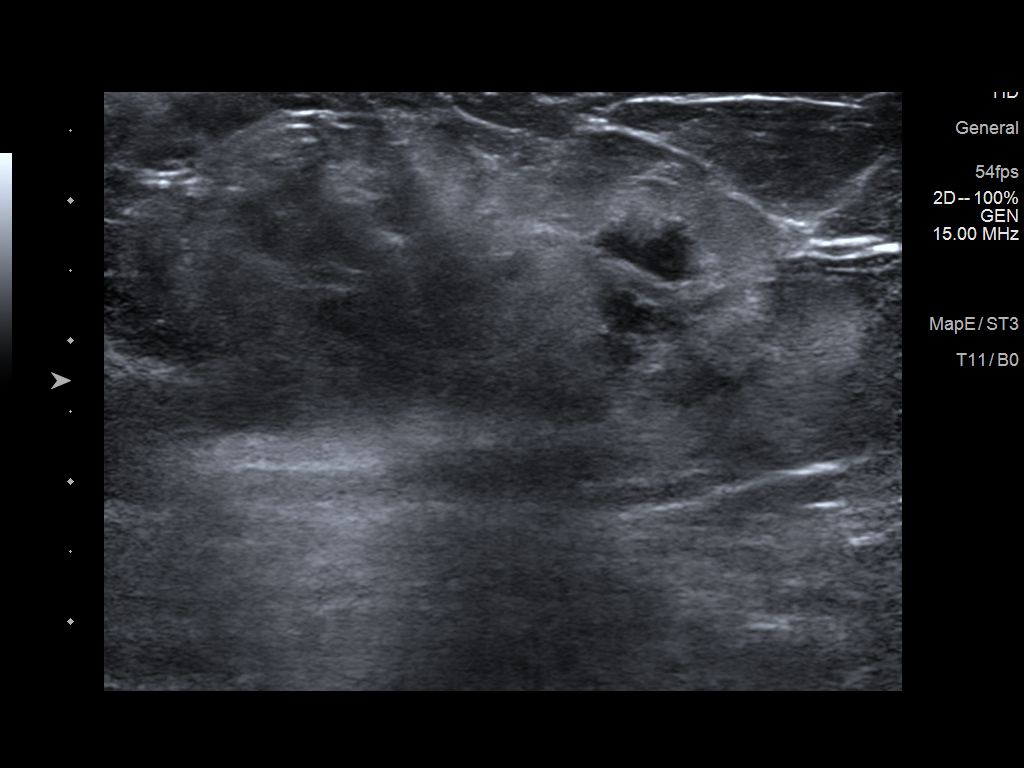
[im 2/3]
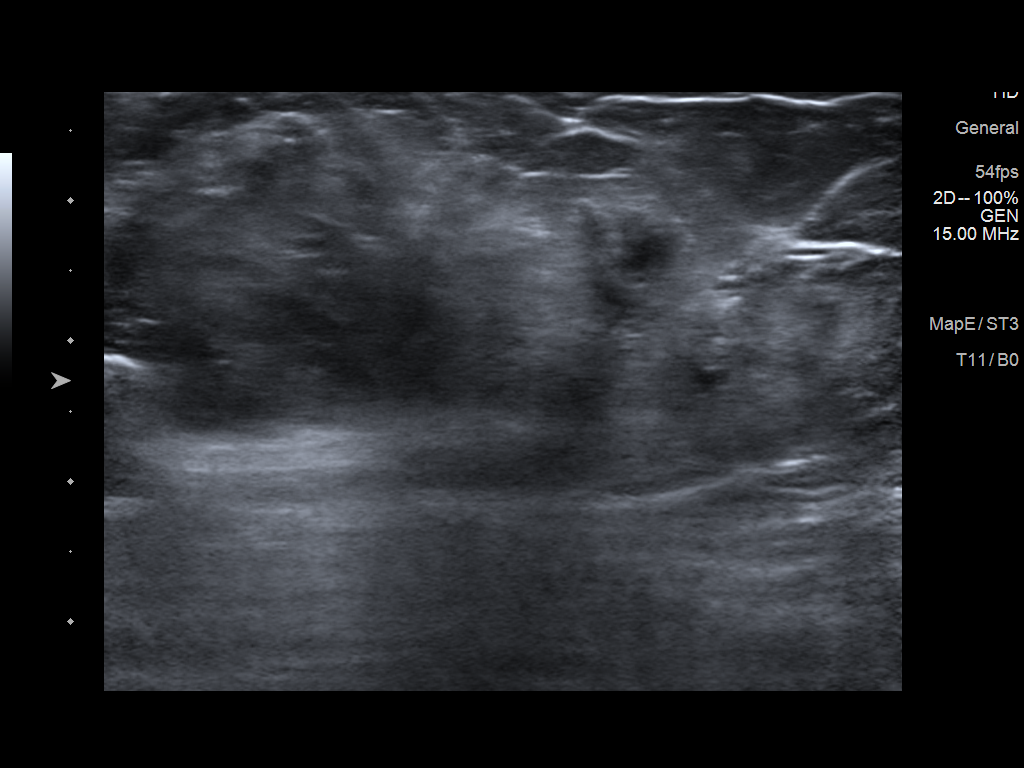
[im 3/3]
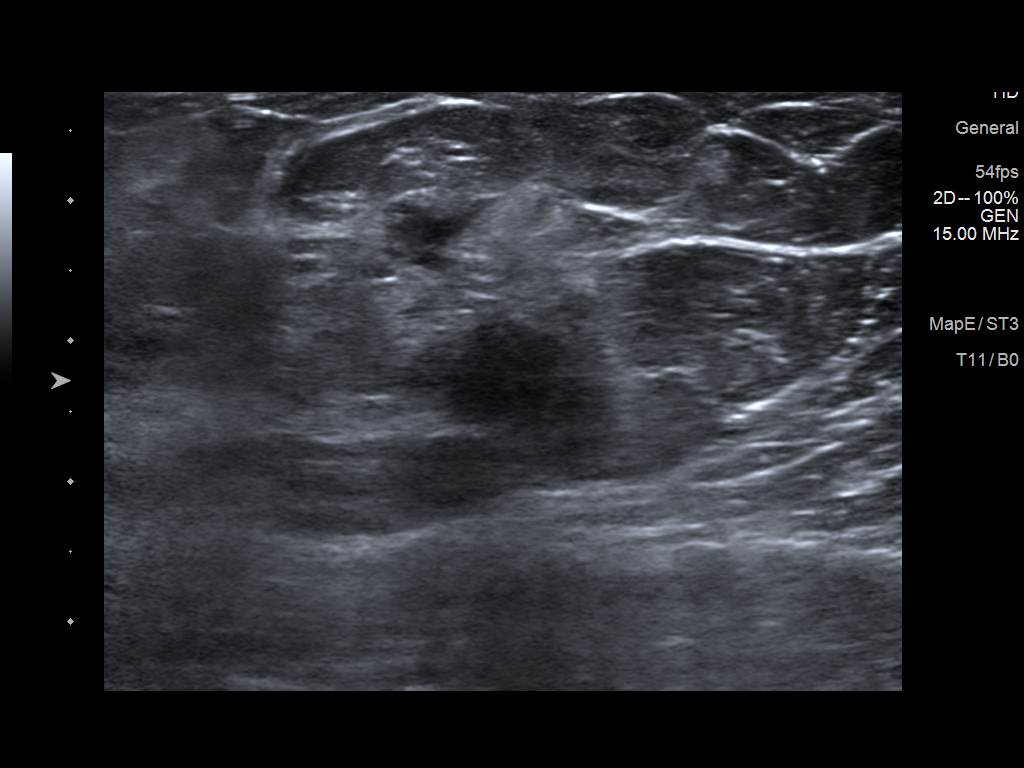

[3 of 3 positions shown; findings below may reference images not displayed]



Lesion quadrant: Upper outer quadrant

Using sterile technique and 1% Lidocaine as local anesthetic, under
direct ultrasound visualization, a 14 gauge Jarkka device was
used to perform biopsy of left breast mass 1 o'clock position using
a lateral approach. At the conclusion of the procedure ribbon shaped
tissue marker clip was deployed into the biopsy cavity. Follow up 2
view mammogram was performed and dictated separately.
IMPRESSION: Ultrasound guided biopsy of left breast mass 1 o'clock position. No
apparent complications.

ADDENDUM:
Pathology revealed GRADE III INVASIVE MAMMARY CARCINOMA of the LEFT
breast, 1 o'clock. This was found to be concordant by Dr. Jumper
Tiger.

Pathology results were discussed with the patient by telephone. The
patient reported doing well after the biopsy with tenderness at the
site. Post biopsy instructions and care were reviewed and questions
were answered. The patient was encouraged to call The [REDACTED]

Surgical consultation has been arranged with Dr. Hazaana Tsju at
[REDACTED] on May 24, 2020.

Pathology results reported by Polin Billiot RN on 05/18/2020.



Lesion quadrant: Upper outer quadrant

Using sterile technique and 1% Lidocaine as local anesthetic, under
direct ultrasound visualization, a 14 gauge Jarkka device was
used to perform biopsy of left breast mass 1 o'clock position using
a lateral approach. At the conclusion of the procedure ribbon shaped
tissue marker clip was deployed into the biopsy cavity. Follow up 2
view mammogram was performed and dictated separately.
IMPRESSION: Ultrasound guided biopsy of left breast mass 1 o'clock position. No
apparent complications.

## 2022-09-19 ENCOUNTER — Telehealth: Payer: Self-pay | Admitting: Nurse Practitioner

## 2022-10-01 IMAGING — MG MM BREAST SURGICAL SPECIMEN
1 series · 1 of 1 positions shown · non-contrast
Comparison: Previous exam(s).

CLINICAL DATA: Evaluate specimen

EXAM:
SPECIMEN RADIOGRAPH OF THE LEFT BREAST

[L]
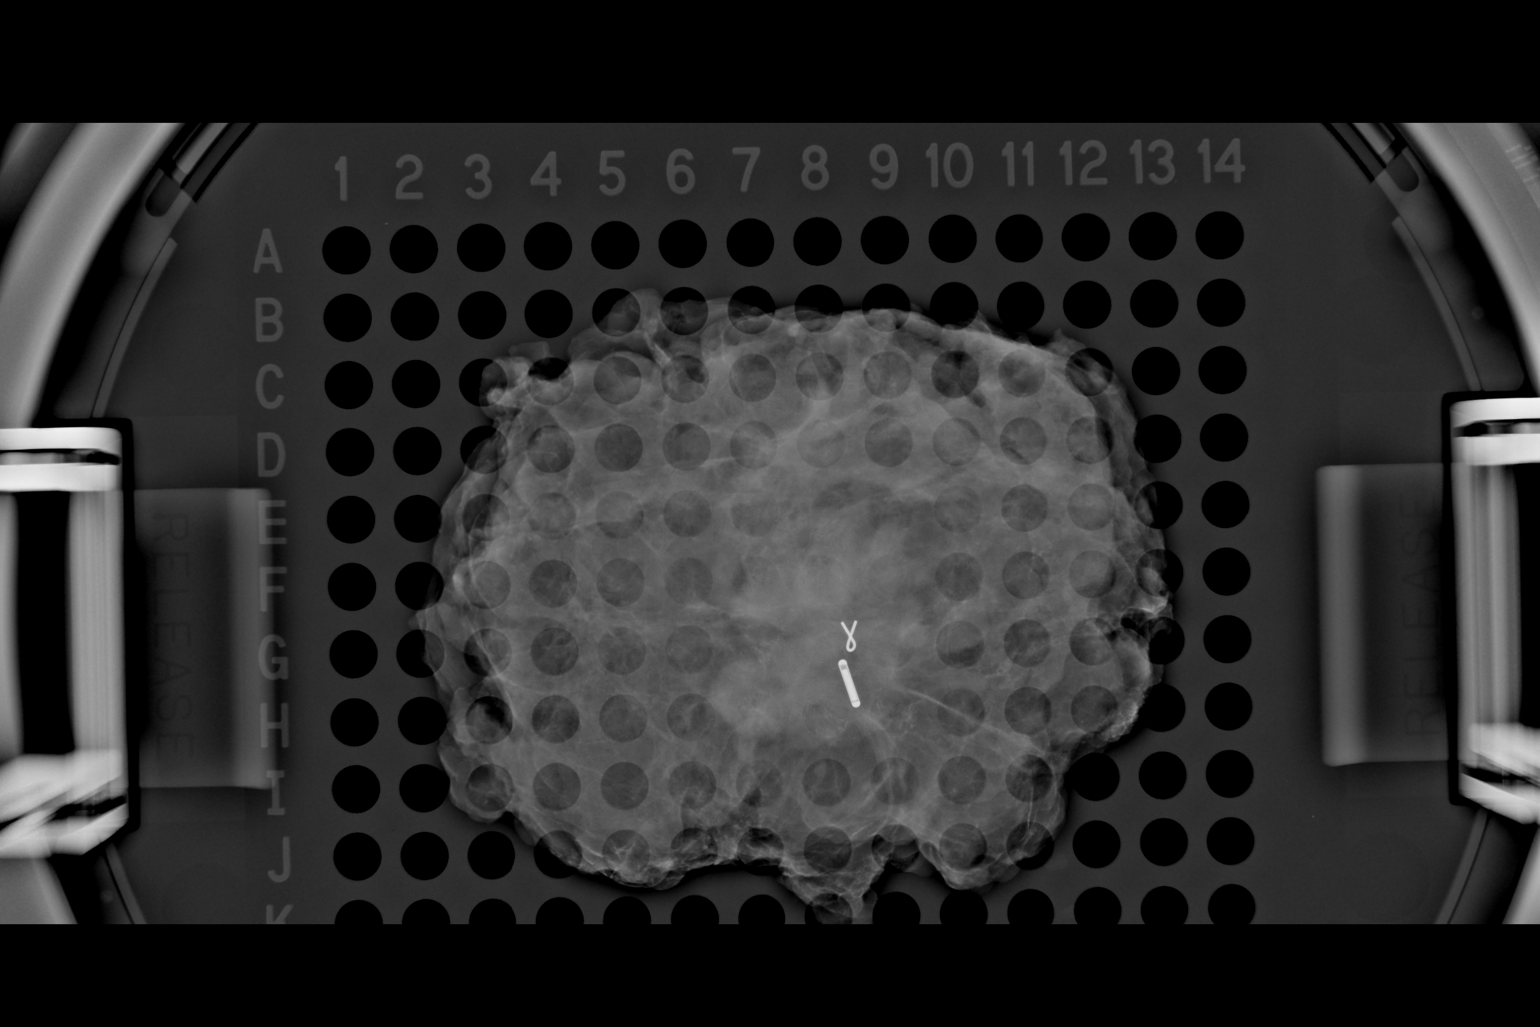

[1 of 1 positions shown; findings below may reference images not displayed]

FINDINGS: Status post excision of the left breast. The radioactive seed and
biopsy marker clip are present, completely intact, and were marked
for pathology.
IMPRESSION: Specimen radiograph of the left breast.

## 2022-10-09 NOTE — Progress Notes (Unsigned)
Patient Care Team: Sherry Dark, MD as PCP - General (Family Medicine) Sherry Proud, RN as Oncology Nurse Navigator Sherry Angelica, RN as Oncology Nurse Navigator Sherry Lint, MD as Consulting Physician (General Surgery) Sherry Puffer, MD as Consulting Physician (Radiation Oncology) Sherry Mood, MD as Consulting Physician (Hematology) Sherry Samples, NP as Nurse Practitioner (Nurse Practitioner) Sherry Leber, PA-C as Physician Assistant (Obstetrics and Gynecology)   CHIEF COMPLAINT: Follow up left breast cancer   Oncology History Overview Note  Cancer Staging Malignant neoplasm of upper-outer quadrant of left breast, estrogen receptor positive [facility]) Staging form: Breast, AJCC 8th Edition - Clinical stage from 05/31/2020: Stage IIA (cT2, cN0, cM0, G3, ER+, PR+, HER2-) - Signed by Sherry Mood, MD on 06/01/2020 Stage prefix: Initial diagnosis    Malignant neoplasm of upper-outer quadrant of left breast, estrogen receptor positive (HCC)  05/17/2020 Mammogram   IMPRESSION: 1. Highly suspicious 2.3 cm mass involving the UPPER OUTER QUADRANT of the LEFT breast at the 1 o'clock position approximately 8 cm from the nipple. 2. No pathologic LEFT axillary lymphadenopathy. 3. No mammographic evidence of malignancy involving the RIGHT breast.   05/17/2020 Initial Biopsy   Diagnosis Breast, left, needle core biopsy, 1 o'clock - INVASIVE MAMMARY CARCINOMA.  Microscopic Comment The carcinoma appears grade 3. The greatest linear extent of tumor in any one core is 10 mm. E-cadherin will be reported separately. Ancillary studies will be reported separately. Results reported to [facility] on 05/18/2020. Sherry. Berneice Carrillo reviewed the case. ADDENDUM: Immunohistochemistry for E-cadherin is positive supporting a ductal phenotype.    05/17/2020 Receptors her2   PROGNOSTIC INDICATORS Results: IMMUNOHISTOCHEMICAL AND MORPHOMETRIC ANALYSIS PERFORMED MANUALLY The tumor cells are  NEGATIVE for Her2 (1+). Estrogen Receptor: 90%, POSITIVE, STRONG STAINING INTENSITY Progesterone Receptor: 60%, POSITIVE, STRONG STAINING INTENSITY Proliferation Marker Ki67: 70%   05/31/2020 Initial Diagnosis   Malignant neoplasm of upper-outer quadrant of left breast, estrogen receptor positive (HCC)   05/31/2020 Cancer Staging   Staging form: Breast, AJCC 8th Edition - Clinical stage from 05/31/2020: Stage IIA (cT2, cN0, cM0, G3, ER+, PR+, HER2-) - Signed by Sherry Mood, MD on 06/01/2020 Stage prefix: Initial diagnosis   06/03/2020 Cancer Staging   Staging form: Breast, AJCC 8th Edition - Pathologic stage from 06/03/2020: Stage IIA (pT2, pN1a, cM0, G3, ER+, PR+, HER2-) - Signed by Sherry Mood, MD on 06/29/2020 Stage prefix: Initial diagnosis Multigene prognostic tests performed: MammaPrint Histologic grading system: 3 grade system   06/03/2020 Surgery   Left breast lumpectomy with left axillary sentinel lymph node  biopsies  A. 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  - High grade ductal carcinoma in situ.  - Invasive carcinoma is at the inferior margin focally and <0.1 cm from  the anterior margin focally.  - In situ carcinoma is <0.1 cm from the anterior margin focally.  - Biopsy site.  - See oncology table.   B. 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   C. LYMPH NODE, LEFT AXILLARY, #2, SENTINEL, EXCISION:  - One of one lymph nodes negative for carcinoma (0/1).   06/03/2020 Miscellaneous   Mammaprint - HIGH RISK   07/20/2020 Imaging   CT CAP  IMPRESSION: 1. Surgical clips in the area of the LEFT breast laterally along the margin of water density well-circumscribed area that measures approximately 4.2 x 3.7 cm. This is likely a postoperative seroma.  Correlate with any pain or developing symptoms that would suggest infection. No gas or secondary findings to suggest this at  this time. 2. No evidence of metastatic disease in the chest, abdomen or pelvis. 3. Hepatic steatosis without focal lesion. 4. Calcified coronary artery disease. 5. Aortic atherosclerosis. 6. Mildly nodular thyroid. 7. Lesion in the RIGHT thyroid measuring up to 1.8 cm. Recommend thyroid US (ref: J Am Coll Radiol. 2015 Feb;12(2): 143-50).     07/20/2020 Imaging   Bone Scan  IMPRESSION: Signs of spondylosis of the thoracic and lumbar spine. No scintigraphic evidence of metastatic disease.     07/27/2020 Surgery   RE-EXCISION OF LEFT BREAST LUMPECTOMY and PAC placement by Sherry Carrillo  FINAL MICROSCOPIC DIAGNOSIS:   A. BREAST, LEFT ANTERIOR MARGIN, EXCISION:  - Prior procedure site changes.  No carcinoma identified.   B. BREAST, LEFT INFERIOR MARGIN, EXCISION:  - Prior procedure site changes.  No carcinoma identified.    08/18/2020 - 10/20/2020 Chemotherapy   docetaxel and Cytoxan (TC) Q3weeks for 4 cycles.     12/13/2020 - 01/27/2021 Radiation Therapy   Site Technique Total Dose (Gy) Dose per Fx (Gy) Completed Fx Beam Energies  Breast, Left: Breast_Lt 3D 50.4/50.4 1.8 28/28 10X  Breast, Left: Breast_Lt_SCLV 3D 50.4/50.4 1.8 28/28 6X, 10X  Breast, Left: Breast_Lt_Bst 3D 10/10 2 5/5 10X      02/23/2021 -  Anti-estrogen oral therapy   Anastrozole   05/03/2021 Survivorship   SCP delivered virtually by Sherry Glad, NP      CURRENT THERAPY: Anti-estrogen therapy starting 02/2021, currently on exemestane since 07/2021   INTERVAL HISTORY Sherry Carrillo returns for follow up as scheduled. Last seen by me 03/05/22. Mammogram 06/21/22 was negative. DEXA 08/28/22 showed mild osteopenia, low frax score.   ROS   Past Medical History:  Diagnosis Date   Abnormal pap 2009   PT HAD SURGERY BUT DON'T KNOW WHAT TYPE   Breast cancer (HCC) 04/27/2020   COVID 2021   GERD (gastroesophageal reflux disease)    Headache(784.0)    otc meds prn   Hypertension    Hyperthyroidism    Personal history of  chemotherapy    Personal history of radiation therapy    SVD (spontaneous vaginal delivery)    x 2     Past Surgical History:  Procedure Laterality Date   BREAST LUMPECTOMY     BREAST LUMPECTOMY WITH RADIOACTIVE SEED AND SENTINEL LYMPH NODE BIOPSY Left 06/03/2020   Procedure: RADIOACTIVE SEED GUIDED LEFT BREAST LUMPECTOMY, LEFT AXILLARY SENTINEL LYMPH NODE BIOPSY;  Surgeon: Sherry Lint, MD;  Location: [facility] OR;  Service: General;  Laterality: Left;   FOOT SURGERY     left   PORT-A-CATH REMOVAL N/A 02/02/2021   Procedure: REMOVAL PORT-A-CATH;  Surgeon: Sherry Lint, MD;  Location: [facility] OR;  Service: General;  Laterality: N/A;   PORTACATH PLACEMENT Left 07/27/2020   Procedure: INSERTION PORT-A-CATH;  Surgeon: Sherry Lint, MD;  Location: [facility] OR;  Service: General;  Laterality: Left;   RE-EXCISION OF BREAST LUMPECTOMY Left 07/27/2020   Procedure: RE-EXCISION OF LEFT BREAST LUMPECTOMY;  Surgeon: Sherry Lint, MD;  Location: [facility];  Service: General;  Laterality: Left;   TUBAL LIGATION     WISDOM TOOTH EXTRACTION       Outpatient Encounter Medications as of 10/10/2022  Medication Sig   acetaminophen (TYLENOL) 325 MG tablet Take 650 mg by mouth every 6 (six) hours as needed.   cyclobenzaprine (FLEXERIL) 10 MG tablet Take 1 tablet (10 mg total) by  mouth 3 (three) times daily as needed for muscle spasms.   exemestane (AROMASIN) 25 MG tablet TAKE 1 TABLET BY MOUTH DAILY  AFTER BREAKFAST   lisinopril-hydrochlorothiazide (ZESTORETIC) 20-12.5 MG tablet Take 1 tablet by mouth daily.   pantoprazole (PROTONIX) 40 MG tablet Take 1 tablet (40 mg total) by mouth daily.   scopolamine (TRANSDERM-SCOP) 1 MG/3DAYS Place 1 patch (1.5 mg total) onto the skin every 3 (three) days.   No facility-administered encounter medications on file as of 10/10/2022.     There were no vitals filed for this visit. There is no height or weight on file to calculate BMI.   PHYSICAL EXAM GENERAL:alert, no distress and  comfortable SKIN: no rash  EYES: sclera clear NECK: without mass LYMPH:  no palpable cervical or supraclavicular lymphadenopathy  LUNGS: clear with normal breathing effort HEART: regular rate & rhythm, no lower extremity edema ABDOMEN: abdomen soft, non-tender and normal bowel sounds NEURO: alert & oriented x 3 with fluent speech, no focal motor/sensory deficits Breast exam:  PAC without erythema    CBC    Component Value Date/Time   WBC 4.4 03/05/2022 0844   WBC 4.8 07/25/2021 1159   RBC 4.74 03/05/2022 0844   HGB 13.4 03/05/2022 0844   HCT 40.9 03/05/2022 0844   PLT 237 03/05/2022 0844   MCV 86.3 03/05/2022 0844   MCH 28.3 03/05/2022 0844   MCHC 32.8 03/05/2022 0844   RDW 13.1 03/05/2022 0844   LYMPHSABS 1.3 03/05/2022 0844   MONOABS 0.3 03/05/2022 0844   EOSABS 0.1 03/05/2022 0844   BASOSABS 0.0 03/05/2022 0844     CMP     Component Value Date/Time   NA 142 03/05/2022 0844   NA 138 05/09/2020 0000   K 4.1 03/05/2022 0844   CL 108 03/05/2022 0844   CO2 27 03/05/2022 0844   GLUCOSE 100 (H) 03/05/2022 0844   BUN 19 03/05/2022 0844   BUN 16 05/09/2020 0000   CREATININE 0.64 03/05/2022 0844   CALCIUM 9.7 03/05/2022 0844   PROT 6.7 03/05/2022 0844   ALBUMIN 3.9 03/05/2022 0844   AST 14 (L) 03/05/2022 0844   ALT 14 03/05/2022 0844   ALKPHOS 79 03/05/2022 0844   BILITOT 0.4 03/05/2022 0844   GFRNONAA >60 03/05/2022 0844   GFRAA 105 05/09/2020 0000     ASSESSMENT & PLAN:Sherry Carrillo is a 57 y.o. female with    1. Malignant neoplasm of upper-outer quadrant of left breast, IDC, Stage IIA, pT2N1M0, stage IIA, ER+/PR+/HER2-, Grade III, Mammaprint high risk -Diagnosed 04/2020, s/p Left breast lumpectomy on 06/03/20 under Sherry. Donell Carrillo and re-excision, final margin was negative.  -Staging workup was negative for distant mets. -S/p chemo with 4 cycles TC 08/18/20 - 10/20/20 and adjuvant radiation 12/13/20 - 01/27/21. -She began antiestrogen therapy initially with anastrozole in  02/2021, tolerated poorly with fatigue, weakness, whole-body aches, and general malaise.  Switch to exemestane in 07/2021 which she is tolerating much better  -mammogram 05/2022 was negative    2. Hot flashes -Post-menopausal on 12/2017 labs with Gyn Sherry Sherry Carrillo -She started having hot flashes in Fall 2021. Her gyn started on her Estradial patch in 04/2020 which helped very well. Due to ER/PR positive breast cancer, she stopped in March 2022.  -She experienced vomiting with Effexor. -she was previously on gabapentin, but does not need it anymore -Now mild, mainly at night; tolerable without medication   3. Bone Health -DEXA 08/2022 showed osteopenia, lowest T score -1.8, no high frax score -  Calcium/vit D and weight bearing       PLAN:  No orders of the defined types were placed in this encounter.     All questions were answered. The patient knows to call the clinic with any problems, questions or concerns. No barriers to learning were detected. I spent *** counseling the patient face to face. The total time spent in the appointment was *** and more than 50% was on counseling, review of test results, and coordination of care.   Sherry Glad, NP-C @DATE @

## 2022-10-10 ENCOUNTER — Encounter: Payer: Self-pay | Admitting: Nurse Practitioner

## 2022-10-10 ENCOUNTER — Inpatient Hospital Stay: Payer: BC Managed Care – PPO | Attending: Nurse Practitioner

## 2022-10-10 ENCOUNTER — Inpatient Hospital Stay: Payer: BC Managed Care – PPO | Admitting: Nurse Practitioner

## 2022-10-10 ENCOUNTER — Other Ambulatory Visit: Payer: Self-pay

## 2022-10-10 VITALS — BP 138/88 | HR 69 | Temp 97.6°F | Resp 17 | Wt 170.7 lb

## 2022-10-10 DIAGNOSIS — Z170 Estrogen receptor positive status [ER+]: Secondary | ICD-10-CM

## 2022-10-10 DIAGNOSIS — R232 Flushing: Secondary | ICD-10-CM | POA: Diagnosis not present

## 2022-10-10 DIAGNOSIS — M8580 Other specified disorders of bone density and structure, unspecified site: Secondary | ICD-10-CM | POA: Insufficient documentation

## 2022-10-10 DIAGNOSIS — Z923 Personal history of irradiation: Secondary | ICD-10-CM | POA: Insufficient documentation

## 2022-10-10 DIAGNOSIS — Z79811 Long term (current) use of aromatase inhibitors: Secondary | ICD-10-CM | POA: Insufficient documentation

## 2022-10-10 DIAGNOSIS — Z9221 Personal history of antineoplastic chemotherapy: Secondary | ICD-10-CM | POA: Diagnosis not present

## 2022-10-10 DIAGNOSIS — C50412 Malignant neoplasm of upper-outer quadrant of left female breast: Secondary | ICD-10-CM | POA: Diagnosis not present

## 2022-10-10 DIAGNOSIS — C773 Secondary and unspecified malignant neoplasm of axilla and upper limb lymph nodes: Secondary | ICD-10-CM | POA: Insufficient documentation

## 2022-10-10 LAB — CBC WITH DIFFERENTIAL (CANCER CENTER ONLY)
Abs Immature Granulocytes: 0.01 10*3/uL (ref 0.00–0.07)
Basophils Absolute: 0 10*3/uL (ref 0.0–0.1)
Basophils Relative: 0 %
Eosinophils Absolute: 0.1 10*3/uL (ref 0.0–0.5)
Eosinophils Relative: 2 %
HCT: 40 % (ref 36.0–46.0)
Hemoglobin: 13.2 g/dL (ref 12.0–15.0)
Immature Granulocytes: 0 %
Lymphocytes Relative: 33 %
Lymphs Abs: 1.5 10*3/uL (ref 0.7–4.0)
MCH: 28.5 pg (ref 26.0–34.0)
MCHC: 33 g/dL (ref 30.0–36.0)
MCV: 86.4 fL (ref 80.0–100.0)
Monocytes Absolute: 0.4 10*3/uL (ref 0.1–1.0)
Monocytes Relative: 8 %
Neutro Abs: 2.7 10*3/uL (ref 1.7–7.7)
Neutrophils Relative %: 57 %
Platelet Count: 243 10*3/uL (ref 150–400)
RBC: 4.63 MIL/uL (ref 3.87–5.11)
RDW: 12.8 % (ref 11.5–15.5)
WBC Count: 4.6 10*3/uL (ref 4.0–10.5)
nRBC: 0 % (ref 0.0–0.2)

## 2022-10-10 LAB — CMP (CANCER CENTER ONLY)
ALT: 14 U/L (ref 0–44)
AST: 15 U/L (ref 15–41)
Albumin: 4 g/dL (ref 3.5–5.0)
Alkaline Phosphatase: 88 U/L (ref 38–126)
Anion gap: 6 (ref 5–15)
BUN: 19 mg/dL (ref 6–20)
CO2: 27 mmol/L (ref 22–32)
Calcium: 9.7 mg/dL (ref 8.9–10.3)
Chloride: 107 mmol/L (ref 98–111)
Creatinine: 0.7 mg/dL (ref 0.44–1.00)
GFR, Estimated: >60 mL/min (ref >60)
Glucose, Bld: 94 mg/dL (ref 70–99)
Potassium: 4.3 mmol/L (ref 3.5–5.1)
Sodium: 140 mmol/L (ref 135–145)
Total Bilirubin: 0.4 mg/dL (ref 0.3–1.2)
Total Protein: 6.9 g/dL (ref 6.5–8.1)

## 2022-10-23 ENCOUNTER — Other Ambulatory Visit: Payer: Self-pay | Admitting: Family Medicine

## 2023-04-17 ENCOUNTER — Telehealth: Payer: Self-pay | Admitting: Nurse Practitioner

## 2023-04-17 ENCOUNTER — Inpatient Hospital Stay: Payer: BC Managed Care – PPO

## 2023-04-17 ENCOUNTER — Inpatient Hospital Stay: Payer: BC Managed Care – PPO | Admitting: Nurse Practitioner

## 2023-04-17 NOTE — Progress Notes (Deleted)
Patient Care Team: Sherry Dark, MD as PCP - General (Family Medicine) Pershing Proud, RN as Oncology Nurse Navigator Sherry Angelica, RN as Oncology Nurse Navigator Sherry Lint, MD as Consulting Physician (General Surgery) Sherry Puffer, MD as Consulting Physician (Radiation Oncology) Sherry Mood, MD as Consulting Physician (Hematology) Sherry Samples, NP as Nurse Practitioner (Nurse Practitioner) Sherry Leber, PA-C as Physician Assistant (Obstetrics and Gynecology)   CHIEF COMPLAINT: Follow up left breast cancer   Oncology History Overview Note  Cancer Staging Malignant neoplasm of upper-outer quadrant of left breast, estrogen receptor positive [facility]) Staging form: Breast, AJCC 8th Edition - Clinical stage from 05/31/2020: Stage IIA (cT2, cN0, cM0, G3, ER+, PR+, HER2-) - Signed by Sherry Mood, MD on 06/01/2020 Stage prefix: Initial diagnosis    Malignant neoplasm of upper-outer quadrant of left breast, estrogen receptor positive (HCC)  05/17/2020 Mammogram   IMPRESSION: 1. Highly suspicious 2.3 cm mass involving the UPPER OUTER QUADRANT of the LEFT breast at the 1 o'clock position approximately 8 cm from the nipple. 2. No pathologic LEFT axillary lymphadenopathy. 3. No mammographic evidence of malignancy involving the RIGHT breast.   05/17/2020 Initial Biopsy   Diagnosis Breast, left, needle core biopsy, 1 o'clock - INVASIVE MAMMARY CARCINOMA.  Microscopic Comment The carcinoma appears grade 3. The greatest linear extent of tumor in any one core is 10 mm. E-cadherin will be reported separately. Ancillary studies will be reported separately. Results reported to [facility] on 05/18/2020. Dr. Berneice Heinrich reviewed the case. ADDENDUM: Immunohistochemistry for E-cadherin is positive supporting a ductal phenotype.    05/17/2020 Receptors her2   PROGNOSTIC INDICATORS Results: IMMUNOHISTOCHEMICAL AND MORPHOMETRIC ANALYSIS PERFORMED MANUALLY The tumor cells are  NEGATIVE for Her2 (1+). Estrogen Receptor: 90%, POSITIVE, STRONG STAINING INTENSITY Progesterone Receptor: 60%, POSITIVE, STRONG STAINING INTENSITY Proliferation Marker Ki67: 70%   05/31/2020 Initial Diagnosis   Malignant neoplasm of upper-outer quadrant of left breast, estrogen receptor positive (HCC)   05/31/2020 Cancer Staging   Staging form: Breast, AJCC 8th Edition - Clinical stage from 05/31/2020: Stage IIA (cT2, cN0, cM0, G3, ER+, PR+, HER2-) - Signed by Sherry Mood, MD on 06/01/2020 Stage prefix: Initial diagnosis   06/03/2020 Cancer Staging   Staging form: Breast, AJCC 8th Edition - Pathologic stage from 06/03/2020: Stage IIA (pT2, pN1a, cM0, G3, ER+, PR+, HER2-) - Signed by Sherry Mood, MD on 06/29/2020 Stage prefix: Initial diagnosis Multigene prognostic tests performed: MammaPrint Histologic grading system: 3 grade system   06/03/2020 Surgery   Left breast lumpectomy with left axillary sentinel lymph node  biopsies  A. 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  - High grade ductal carcinoma in situ.  - Invasive carcinoma is at the inferior margin focally and <0.1 cm from  the anterior margin focally.  - In situ carcinoma is <0.1 cm from the anterior margin focally.  - Biopsy site.  - See oncology table.   B. 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   C. LYMPH NODE, LEFT AXILLARY, #2, SENTINEL, EXCISION:  - One of one lymph nodes negative for carcinoma (0/1).   06/03/2020 Miscellaneous   Mammaprint - HIGH RISK   07/20/2020 Imaging   CT CAP  IMPRESSION: 1. Surgical clips in the area of the LEFT breast laterally along the margin of water density well-circumscribed area that measures approximately 4.2 x 3.7 cm. This is likely a postoperative seroma.  Correlate with any pain or developing symptoms that would suggest infection. No gas or secondary findings to suggest this at  this time. 2. No evidence of metastatic disease in the chest, abdomen or pelvis. 3. Hepatic steatosis without focal lesion. 4. Calcified coronary artery disease. 5. Aortic atherosclerosis. 6. Mildly nodular thyroid. 7. Lesion in the RIGHT thyroid measuring up to 1.8 cm. Recommend thyroid US (ref: J Am Coll Radiol. 2015 Feb;12(2): 143-50).     07/20/2020 Imaging   Bone Scan  IMPRESSION: Signs of spondylosis of the thoracic and lumbar spine. No scintigraphic evidence of metastatic disease.     07/27/2020 Surgery   RE-EXCISION OF LEFT BREAST LUMPECTOMY and PAC placement by Dr Donell Beers  FINAL MICROSCOPIC DIAGNOSIS:   A. BREAST, LEFT ANTERIOR MARGIN, EXCISION:  - Prior procedure site changes.  No carcinoma identified.   B. BREAST, LEFT INFERIOR MARGIN, EXCISION:  - Prior procedure site changes.  No carcinoma identified.    08/18/2020 - 10/20/2020 Chemotherapy   docetaxel and Cytoxan (TC) Q3weeks for 4 cycles.     12/13/2020 - 01/27/2021 Radiation Therapy   Site Technique Total Dose (Gy) Dose per Fx (Gy) Completed Fx Beam Energies  Breast, Left: Breast_Lt 3D 50.4/50.4 1.8 28/28 10X  Breast, Left: Breast_Lt_SCLV 3D 50.4/50.4 1.8 28/28 6X, 10X  Breast, Left: Breast_Lt_Bst 3D 10/10 2 5/5 10X      02/23/2021 -  Anti-estrogen oral therapy   Anastrozole   05/03/2021 Survivorship   SCP delivered virtually by Sherry Glad, NP      CURRENT THERAPY: Anti-estrogen therapy starting 02/2021, currently on exemestane since 07/2021   INTERVAL HISTORY Sherry Carrillo returns for follow up as scheduled. Last seen by me 10/10/22.   ROS   Past Medical History:  Diagnosis Date   Abnormal pap 2009   PT HAD SURGERY BUT DON'T KNOW WHAT TYPE   Breast cancer (HCC) 04/27/2020   COVID 2021   GERD (gastroesophageal reflux disease)    Headache(784.0)    otc meds prn   Hypertension    Hyperthyroidism    Personal history of chemotherapy    Personal history of radiation therapy    SVD (spontaneous vaginal  delivery)    x 2     Past Surgical History:  Procedure Laterality Date   BREAST LUMPECTOMY     BREAST LUMPECTOMY WITH RADIOACTIVE SEED AND SENTINEL LYMPH NODE BIOPSY Left 06/03/2020   Procedure: RADIOACTIVE SEED GUIDED LEFT BREAST LUMPECTOMY, LEFT AXILLARY SENTINEL LYMPH NODE BIOPSY;  Surgeon: Sherry Lint, MD;  Location: [facility] OR;  Service: General;  Laterality: Left;   FOOT SURGERY     left   PORT-A-CATH REMOVAL N/A 02/02/2021   Procedure: REMOVAL PORT-A-CATH;  Surgeon: Sherry Lint, MD;  Location: [facility];  Service: General;  Laterality: N/A;   PORTACATH PLACEMENT Left 07/27/2020   Procedure: INSERTION PORT-A-CATH;  Surgeon: Sherry Lint, MD;  Location: [facility];  Service: General;  Laterality: Left;   RE-EXCISION OF BREAST LUMPECTOMY Left 07/27/2020   Procedure: RE-EXCISION OF LEFT BREAST LUMPECTOMY;  Surgeon: Sherry Lint, MD;  Location: [facility];  Service: General;  Laterality: Left;   TUBAL LIGATION     WISDOM TOOTH EXTRACTION       Outpatient Encounter Medications as of 04/17/2023  Medication Sig   exemestane (AROMASIN) 25 MG tablet TAKE 1 TABLET BY MOUTH DAILY  AFTER BREAKFAST   lisinopril-hydrochlorothiazide (ZESTORETIC) 20-12.5 MG tablet TAKE 1 TABLET BY MOUTH DAILY   pantoprazole (PROTONIX) 40 MG tablet Take 1 tablet (40 mg total) by mouth  daily.   No facility-administered encounter medications on file as of 04/17/2023.     There were no vitals filed for this visit. There is no height or weight on file to calculate BMI.   PHYSICAL EXAM GENERAL:alert, no distress and comfortable SKIN: no rash  EYES: sclera clear NECK: without mass LYMPH:  no palpable cervical or supraclavicular lymphadenopathy  LUNGS: clear with normal breathing effort HEART: regular rate & rhythm, no lower extremity edema ABDOMEN: abdomen soft, non-tender and normal bowel sounds NEURO: alert & oriented x 3 with fluent speech, no focal motor/sensory deficits Breast exam:  PAC without erythema    CBC     Component Value Date/Time   WBC 4.6 10/10/2022 0823   WBC 4.8 07/25/2021 1159   RBC 4.63 10/10/2022 0823   HGB 13.2 10/10/2022 0823   HCT 40.0 10/10/2022 0823   PLT 243 10/10/2022 0823   MCV 86.4 10/10/2022 0823   MCH 28.5 10/10/2022 0823   MCHC 33.0 10/10/2022 0823   RDW 12.8 10/10/2022 0823   LYMPHSABS 1.5 10/10/2022 0823   MONOABS 0.4 10/10/2022 0823   EOSABS 0.1 10/10/2022 0823   BASOSABS 0.0 10/10/2022 0823     CMP     Component Value Date/Time   NA 140 10/10/2022 0823   NA 138 05/09/2020 0000   K 4.3 10/10/2022 0823   CL 107 10/10/2022 0823   CO2 27 10/10/2022 0823   GLUCOSE 94 10/10/2022 0823   BUN 19 10/10/2022 0823   BUN 16 05/09/2020 0000   CREATININE 0.70 10/10/2022 0823   CALCIUM 9.7 10/10/2022 0823   PROT 6.9 10/10/2022 0823   ALBUMIN 4.0 10/10/2022 0823   AST 15 10/10/2022 0823   ALT 14 10/10/2022 0823   ALKPHOS 88 10/10/2022 0823   BILITOT 0.4 10/10/2022 0823   GFRNONAA >60 10/10/2022 0823   GFRAA 105 05/09/2020 0000     ASSESSMENT & PLAN:Sherry Carrillo is a 58 y.o. female with    1. Malignant neoplasm of upper-outer quadrant of left breast, IDC, Stage IIA, pT2N1M0, stage IIA, ER+/PR+/HER2-, Grade III, Mammaprint high risk -Diagnosed 04/2020, s/p Left breast lumpectomy on 06/03/20 under Dr. Donell Beers and re-excision, final margin was negative.  -Staging workup was negative for distant mets. -S/p chemo with 4 cycles TC 08/18/20 - 10/20/20 and adjuvant radiation 12/13/20 - 01/27/21. -She began antiestrogen therapy initially with anastrozole in 02/2021, tolerated poorly with fatigue, weakness, whole-body aches, and general malaise.  Switch to exemestane in 07/2021 which she is tolerating better  -She has developed progressive stiffness and decreased grip strength in her hands.  We had a lengthy discussion about AI, alternative agents (letrozole versus tamoxifen), and recurrence risk if she stops altogether.   -She can continue Exemestane for now, OK to take short  breaks (skip 1-2 days or hold during vacation), but I encouraged her to keep this to a minimum.  -If she does switch to a different agent, given her poor tolerance to anastrozole she may not tolerate letrozole, I will try tamoxifen next if she is open -She would like to retire to [place] after she completes antiestrogen therapy.  I encouraged her to look into oncologist and healthcare (i.e. mammograms) in the area   2. Hot flashes -Post-menopausal on 12/2017 labs with Gyn Dr Sherry Carrillo -She started having hot flashes in Fall 2021. Her gyn started on her Estradial patch in 04/2020 which helped very well. Due to ER/PR positive breast cancer, she stopped in March 2022.  -She experienced vomiting with  Effexor. -she was previously on gabapentin, but does not need it anymore -Now mild, mainly at night; tolerable without medication.  Not discussed today   3. Bone Health -DEXA 08/2022 showed osteopenia, lowest T score -1.8, no high frax score -She continues vitamin D and added calcium, continue weight bearing exercise    PLAN:  No orders of the defined types were placed in this encounter.     All questions were answered. The patient knows to call the clinic with any problems, questions or concerns. No barriers to learning were detected. I spent *** counseling the patient face to face. The total time spent in the appointment was *** and more than 50% was on counseling, review of test results, and coordination of care.   Sherry Glad, NP-C @DATE @

## 2023-04-17 NOTE — Telephone Encounter (Signed)
Due to the weather left patient a voicemail in regards to rescheduling appointment, left callback number when ready for rescheduling

## 2023-05-03 ENCOUNTER — Other Ambulatory Visit: Payer: Self-pay | Admitting: Family Medicine

## 2023-05-03 ENCOUNTER — Ambulatory Visit (INDEPENDENT_AMBULATORY_CARE_PROVIDER_SITE_OTHER): Payer: BC Managed Care – PPO

## 2023-05-03 DIAGNOSIS — Z23 Encounter for immunization: Secondary | ICD-10-CM

## 2023-05-03 MED ORDER — TIRZEPATIDE 2.5 MG/0.5ML ~~LOC~~ SOAJ
2.5000 mg | SUBCUTANEOUS | 0 refills | Status: DC
Start: 2023-05-03 — End: 2023-05-14

## 2023-05-03 NOTE — Progress Notes (Signed)
 Patient present with her husband during today's visit. She asked about starting medication for weight loss.  She has been stressed starting a GLP agonist.  She would be a good candidate for this due to her BMI and comorbidities including dyslipidemia, and hypertension. We will start Mounjaro  2.5 mg weekly.  We discussed potential side effects.  She will follow-up with us  in a few weeks via MyChart we can adjust dose as needed.  She will come back to see me in 3 months and we can recheck labs at that point.

## 2023-05-06 ENCOUNTER — Other Ambulatory Visit (HOSPITAL_COMMUNITY): Payer: Self-pay

## 2023-05-07 ENCOUNTER — Other Ambulatory Visit (HOSPITAL_COMMUNITY): Payer: Self-pay

## 2023-05-07 ENCOUNTER — Telehealth: Payer: Self-pay

## 2023-05-07 NOTE — Telephone Encounter (Signed)
Pharmacy Patient Advocate Encounter   Received notification from CoverMyMeds that prior authorization for Mounjaro 2.5MG /0.5ML auto-injectors is required/requested.   Insurance verification completed.   The patient is insured through [facility] .   Per test claim: PA required; PA submitted to above mentioned insurance via Prompt PA Key/confirmation #/EOC [id number] Status is pending

## 2023-05-09 ENCOUNTER — Telehealth: Payer: Self-pay | Admitting: *Deleted

## 2023-05-09 NOTE — Telephone Encounter (Signed)
Pharmacy Patient Advocate Encounter  Received notification from [employer] that Prior Authorization for [facility] 2.5MG /0.5ML auto-injectors  has been DENIED.  Full denial letter will be uploaded to the media tab. See denial reason below.   PA #/Case ID/Reference #: [id number]   DENIAL REASON: Requirements not met.

## 2023-05-09 NOTE — Telephone Encounter (Signed)
Copied from CRM [id number]. Topic: General - Other >> May 09, 2023 12:00 PM Truddie Crumble wrote: Reason for CRM: patient called stating she has not heard back regarding her weight loss medication CB [phone]   Patient need PA Rx mounjaro  Thanks Airyanna Dipalma,RMA

## 2023-05-13 ENCOUNTER — Telehealth: Payer: Self-pay

## 2023-05-13 NOTE — Progress Notes (Unsigned)
 Patient Care Team: Ardith Dark, MD as PCP - General (Family Medicine) Pershing Proud, RN as Oncology Nurse Navigator Donnelly Angelica, RN as Oncology Nurse Navigator Almond Lint, MD as Consulting Physician (General Surgery) Dorothy Puffer, MD as Consulting Physician (Radiation Oncology) Malachy Mood, MD as Consulting Physician (Hematology) Pollyann Samples, NP as Nurse Practitioner (Nurse Practitioner) Henreitta Leber, PA-C as Physician Assistant (Obstetrics and Gynecology)   CHIEF COMPLAINT: Follow up left breast cancer   Oncology History Overview Note  Cancer Staging Malignant neoplasm of upper-outer quadrant of left breast, estrogen receptor positive [facility]) Staging form: Breast, AJCC 8th Edition - Clinical stage from 05/31/2020: Stage IIA (cT2, cN0, cM0, G3, ER+, PR+, HER2-) - Signed by Malachy Mood, MD on 06/01/2020 Stage prefix: Initial diagnosis    Malignant neoplasm of upper-outer quadrant of left breast, estrogen receptor positive (HCC)  05/17/2020 Mammogram   IMPRESSION: 1. Highly suspicious 2.3 cm mass involving the UPPER OUTER QUADRANT of the LEFT breast at the 1 o'clock position approximately 8 cm from the nipple. 2. No pathologic LEFT axillary lymphadenopathy. 3. No mammographic evidence of malignancy involving the RIGHT breast.   05/17/2020 Initial Biopsy   Diagnosis Breast, left, needle core biopsy, 1 o'clock - INVASIVE MAMMARY CARCINOMA.  Microscopic Comment The carcinoma appears grade 3. The greatest linear extent of tumor in any one core is 10 mm. E-cadherin will be reported separately. Ancillary studies will be reported separately. Results reported to [facility] on 05/18/2020. Dr. Berneice Heinrich reviewed the case. ADDENDUM: Immunohistochemistry for E-cadherin is positive supporting a ductal phenotype.    05/17/2020 Receptors her2   PROGNOSTIC INDICATORS Results: IMMUNOHISTOCHEMICAL AND MORPHOMETRIC ANALYSIS PERFORMED MANUALLY The tumor cells are  NEGATIVE for Her2 (1+). Estrogen Receptor: 90%, POSITIVE, STRONG STAINING INTENSITY Progesterone Receptor: 60%, POSITIVE, STRONG STAINING INTENSITY Proliferation Marker Ki67: 70%   05/31/2020 Initial Diagnosis   Malignant neoplasm of upper-outer quadrant of left breast, estrogen receptor positive (HCC)   05/31/2020 Cancer Staging   Staging form: Breast, AJCC 8th Edition - Clinical stage from 05/31/2020: Stage IIA (cT2, cN0, cM0, G3, ER+, PR+, HER2-) - Signed by Malachy Mood, MD on 06/01/2020 Stage prefix: Initial diagnosis   06/03/2020 Cancer Staging   Staging form: Breast, AJCC 8th Edition - Pathologic stage from 06/03/2020: Stage IIA (pT2, pN1a, cM0, G3, ER+, PR+, HER2-) - Signed by Malachy Mood, MD on 06/29/2020 Stage prefix: Initial diagnosis Multigene prognostic tests performed: MammaPrint Histologic grading system: 3 grade system   06/03/2020 Surgery   Left breast lumpectomy with left axillary sentinel lymph node  biopsies  A. 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  - High grade ductal carcinoma in situ.  - Invasive carcinoma is at the inferior margin focally and <0.1 cm from  the anterior margin focally.  - In situ carcinoma is <0.1 cm from the anterior margin focally.  - Biopsy site.  - See oncology table.   B. 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   C. LYMPH NODE, LEFT AXILLARY, #2, SENTINEL, EXCISION:  - One of one lymph nodes negative for carcinoma (0/1).   06/03/2020 Miscellaneous   Mammaprint - HIGH RISK   07/20/2020 Imaging   CT CAP  IMPRESSION: 1. Surgical clips in the area of the LEFT breast laterally along the margin of water density well-circumscribed area that measures approximately 4.2 x 3.7 cm. This is likely a postoperative seroma.  Correlate with any pain or developing symptoms that would suggest infection. No gas or secondary findings to suggest this at  this time. 2. No evidence of metastatic disease in the chest, abdomen or pelvis. 3. Hepatic steatosis without focal lesion. 4. Calcified coronary artery disease. 5. Aortic atherosclerosis. 6. Mildly nodular thyroid. 7. Lesion in the RIGHT thyroid measuring up to 1.8 cm. Recommend thyroid US (ref: J Am Coll Radiol. 2015 Feb;12(2): 143-50).     07/20/2020 Imaging   Bone Scan  IMPRESSION: Signs of spondylosis of the thoracic and lumbar spine. No scintigraphic evidence of metastatic disease.     07/27/2020 Surgery   RE-EXCISION OF LEFT BREAST LUMPECTOMY and PAC placement by Dr Donell Beers  FINAL MICROSCOPIC DIAGNOSIS:   A. BREAST, LEFT ANTERIOR MARGIN, EXCISION:  - Prior procedure site changes.  No carcinoma identified.   B. BREAST, LEFT INFERIOR MARGIN, EXCISION:  - Prior procedure site changes.  No carcinoma identified.    08/18/2020 - 10/20/2020 Chemotherapy   docetaxel and Cytoxan (TC) Q3weeks for 4 cycles.     12/13/2020 - 01/27/2021 Radiation Therapy   Site Technique Total Dose (Gy) Dose per Fx (Gy) Completed Fx Beam Energies  Breast, Left: Breast_Lt 3D 50.4/50.4 1.8 28/28 10X  Breast, Left: Breast_Lt_SCLV 3D 50.4/50.4 1.8 28/28 6X, 10X  Breast, Left: Breast_Lt_Bst 3D 10/10 2 5/5 10X      02/23/2021 -  Anti-estrogen oral therapy   Anastrozole   05/03/2021 Survivorship   SCP delivered virtually by Santiago Glad, NP      CURRENT THERAPY: Anti-estrogen therapy starting 02/2021, currently on exemestane since 07/2021   INTERVAL HISTORY Ms. Ozer returns for follow up as scheduled. Last seen by me 10/10/22.   ROS   Past Medical History:  Diagnosis Date   Abnormal pap 2009   PT HAD SURGERY BUT DON'T KNOW WHAT TYPE   Breast cancer (HCC) 04/27/2020   COVID 2021   GERD (gastroesophageal reflux disease)    Headache(784.0)    otc meds prn   Hypertension    Hyperthyroidism    Personal history of chemotherapy    Personal history of radiation therapy    SVD (spontaneous vaginal  delivery)    x 2     Past Surgical History:  Procedure Laterality Date   BREAST LUMPECTOMY     BREAST LUMPECTOMY WITH RADIOACTIVE SEED AND SENTINEL LYMPH NODE BIOPSY Left 06/03/2020   Procedure: RADIOACTIVE SEED GUIDED LEFT BREAST LUMPECTOMY, LEFT AXILLARY SENTINEL LYMPH NODE BIOPSY;  Surgeon: Almond Lint, MD;  Location: [facility] OR;  Service: General;  Laterality: Left;   FOOT SURGERY     left   PORT-A-CATH REMOVAL N/A 02/02/2021   Procedure: REMOVAL PORT-A-CATH;  Surgeon: Almond Lint, MD;  Location: [facility] OR;  Service: General;  Laterality: N/A;   PORTACATH PLACEMENT Left 07/27/2020   Procedure: INSERTION PORT-A-CATH;  Surgeon: Almond Lint, MD;  Location: [facility] OR;  Service: General;  Laterality: Left;   RE-EXCISION OF BREAST LUMPECTOMY Left 07/27/2020   Procedure: RE-EXCISION OF LEFT BREAST LUMPECTOMY;  Surgeon: Almond Lint, MD;  Location: [facility] OR;  Service: General;  Laterality: Left;   TUBAL LIGATION     WISDOM TOOTH EXTRACTION       Outpatient Encounter Medications as of 05/14/2023  Medication Sig   exemestane (AROMASIN) 25 MG tablet TAKE 1 TABLET BY MOUTH DAILY  AFTER BREAKFAST   lisinopril-hydrochlorothiazide (ZESTORETIC) 20-12.5 MG tablet TAKE 1 TABLET BY MOUTH DAILY   pantoprazole (PROTONIX) 40 MG tablet Take 1 tablet (40 mg total) by mouth  daily.   tirzepatide [facility]) 2.5 MG/0.5ML Pen Inject 2.5 mg into the skin once a week.   No facility-administered encounter medications on file as of 05/14/2023.     There were no vitals filed for this visit. There is no height or weight on file to calculate BMI.   PHYSICAL EXAM GENERAL:alert, no distress and comfortable SKIN: no rash  EYES: sclera clear NECK: without mass LYMPH:  no palpable cervical or supraclavicular lymphadenopathy  LUNGS: clear with normal breathing effort HEART: regular rate & rhythm, no lower extremity edema ABDOMEN: abdomen soft, non-tender and normal bowel sounds NEURO: alert & oriented x 3 with fluent  speech, no focal motor/sensory deficits Breast exam:  PAC without erythema    CBC    Component Value Date/Time   WBC 4.6 10/10/2022 0823   WBC 4.8 07/25/2021 1159   RBC 4.63 10/10/2022 0823   HGB 13.2 10/10/2022 0823   HCT 40.0 10/10/2022 0823   PLT 243 10/10/2022 0823   MCV 86.4 10/10/2022 0823   MCH 28.5 10/10/2022 0823   MCHC 33.0 10/10/2022 0823   RDW 12.8 10/10/2022 0823   LYMPHSABS 1.5 10/10/2022 0823   MONOABS 0.4 10/10/2022 0823   EOSABS 0.1 10/10/2022 0823   BASOSABS 0.0 10/10/2022 0823     CMP     Component Value Date/Time   NA 140 10/10/2022 0823   NA 138 05/09/2020 0000   K 4.3 10/10/2022 0823   CL 107 10/10/2022 0823   CO2 27 10/10/2022 0823   GLUCOSE 94 10/10/2022 0823   BUN 19 10/10/2022 0823   BUN 16 05/09/2020 0000   CREATININE 0.70 10/10/2022 0823   CALCIUM 9.7 10/10/2022 0823   PROT 6.9 10/10/2022 0823   ALBUMIN 4.0 10/10/2022 0823   AST 15 10/10/2022 0823   ALT 14 10/10/2022 0823   ALKPHOS 88 10/10/2022 0823   BILITOT 0.4 10/10/2022 0823   GFRNONAA >60 10/10/2022 0823   GFRAA 105 05/09/2020 0000     ASSESSMENT & PLAN:Emmalin L Worden is a 58 y.o. female with    1. Malignant neoplasm of upper-outer quadrant of left breast, IDC, Stage IIA, pT2N1M0, stage IIA, ER+/PR+/HER2-, Grade III, Mammaprint high risk -Diagnosed 04/2020, s/p Left breast lumpectomy on 06/03/20 under Dr. Donell Beers and re-excision, final margin was negative.  -Staging workup was negative for distant mets. -S/p chemo with 4 cycles TC 08/18/20 - 10/20/20 and adjuvant radiation 12/13/20 - 01/27/21. -She began antiestrogen therapy initially with anastrozole in 02/2021, tolerated poorly with fatigue, weakness, whole-body aches, and general malaise.  Switch to exemestane in 07/2021 which she is tolerating better  -She has developed progressive stiffness and decreased grip strength in her hands.  We had a lengthy discussion about AI, alternative agents (letrozole versus tamoxifen), and recurrence  risk if she stops altogether.   -She can continue Exemestane for now, OK to take short breaks (skip 1-2 days or hold during vacation), but I encouraged her to keep this to a minimum.  -If she does switch to a different agent, given her poor tolerance to anastrozole she may not tolerate letrozole, I will try tamoxifen next if she is open -She would like to retire to [place] after she completes antiestrogen therapy.  I encouraged her to look into oncologist and healthcare (i.e. mammograms) in the area   2. Hot flashes -Post-menopausal on 12/2017 labs with Gyn Dr Henreitta Leber -She started having hot flashes in Fall 2021. Her gyn started on her Estradial patch in 04/2020 which helped very well.  Due to ER/PR positive breast cancer, she stopped in March 2022.  -She experienced vomiting with Effexor. -she was previously on gabapentin, but does not need it anymore -Now mild, mainly at night; tolerable without medication.  Not discussed today   3. Bone Health -DEXA 08/2022 showed osteopenia, lowest T score -1.8, no high frax score -She continues vitamin D and added calcium, continue weight bearing exercise    PLAN:  No orders of the defined types were placed in this encounter.     All questions were answered. The patient knows to call the clinic with any problems, questions or concerns. No barriers to learning were detected. I spent *** counseling the patient face to face. The total time spent in the appointment was *** and more than 50% was on counseling, review of test results, and coordination of care.   Santiago Glad, NP-C @DATE @

## 2023-05-13 NOTE — Telephone Encounter (Signed)
 Copied from CRM [id number]. Topic: Clinical - Prescription Issue >> May 13, 2023  8:25 AM Sherry Carrillo wrote: Reason for CRM: Patient requesting to speak with Sherry Carrillo in regard to the PA for [facility].  Spoke with pt to let her know that the insurance com[any denied the mounjaro. I advised her to contact her insurance carrier and ask them what will they cover and let us know.

## 2023-05-14 ENCOUNTER — Inpatient Hospital Stay (HOSPITAL_BASED_OUTPATIENT_CLINIC_OR_DEPARTMENT_OTHER): Payer: BC Managed Care – PPO | Admitting: Nurse Practitioner

## 2023-05-14 ENCOUNTER — Encounter: Payer: Self-pay | Admitting: Nurse Practitioner

## 2023-05-14 ENCOUNTER — Inpatient Hospital Stay: Payer: BC Managed Care – PPO | Attending: Nurse Practitioner

## 2023-05-14 VITALS — BP 122/70 | HR 86 | Temp 98.3°F | Resp 18 | Wt 169.3 lb

## 2023-05-14 DIAGNOSIS — C50412 Malignant neoplasm of upper-outer quadrant of left female breast: Secondary | ICD-10-CM | POA: Diagnosis not present

## 2023-05-14 DIAGNOSIS — Z79811 Long term (current) use of aromatase inhibitors: Secondary | ICD-10-CM | POA: Insufficient documentation

## 2023-05-14 DIAGNOSIS — Z170 Estrogen receptor positive status [ER+]: Secondary | ICD-10-CM | POA: Insufficient documentation

## 2023-05-14 DIAGNOSIS — Z1732 Human epidermal growth factor receptor 2 negative status: Secondary | ICD-10-CM | POA: Insufficient documentation

## 2023-05-14 DIAGNOSIS — Z923 Personal history of irradiation: Secondary | ICD-10-CM | POA: Insufficient documentation

## 2023-05-14 DIAGNOSIS — Z9221 Personal history of antineoplastic chemotherapy: Secondary | ICD-10-CM | POA: Diagnosis not present

## 2023-05-14 DIAGNOSIS — Z1721 Progesterone receptor positive status: Secondary | ICD-10-CM | POA: Diagnosis not present

## 2023-05-14 DIAGNOSIS — M8580 Other specified disorders of bone density and structure, unspecified site: Secondary | ICD-10-CM | POA: Insufficient documentation

## 2023-05-14 LAB — CBC WITH DIFFERENTIAL (CANCER CENTER ONLY)
Abs Immature Granulocytes: 0.01 10*3/uL (ref 0.00–0.07)
Basophils Absolute: 0 10*3/uL (ref 0.0–0.1)
Basophils Relative: 1 %
Eosinophils Absolute: 0.1 10*3/uL (ref 0.0–0.5)
Eosinophils Relative: 2 %
HCT: 42.6 % (ref 36.0–46.0)
Hemoglobin: 14 g/dL (ref 12.0–15.0)
Immature Granulocytes: 0 %
Lymphocytes Relative: 31 %
Lymphs Abs: 1.5 10*3/uL (ref 0.7–4.0)
MCH: 28.1 pg (ref 26.0–34.0)
MCHC: 32.9 g/dL (ref 30.0–36.0)
MCV: 85.4 fL (ref 80.0–100.0)
Monocytes Absolute: 0.4 10*3/uL (ref 0.1–1.0)
Monocytes Relative: 8 %
Neutro Abs: 2.9 10*3/uL (ref 1.7–7.7)
Neutrophils Relative %: 58 %
Platelet Count: 251 10*3/uL (ref 150–400)
RBC: 4.99 MIL/uL (ref 3.87–5.11)
RDW: 12.8 % (ref 11.5–15.5)
WBC Count: 4.9 10*3/uL (ref 4.0–10.5)
nRBC: 0 % (ref 0.0–0.2)

## 2023-05-14 LAB — CMP (CANCER CENTER ONLY)
ALT: 12 U/L (ref 0–44)
AST: 13 U/L — ABNORMAL LOW (ref 15–41)
Albumin: 4.2 g/dL (ref 3.5–5.0)
Alkaline Phosphatase: 94 U/L (ref 38–126)
Anion gap: 8 (ref 5–15)
BUN: 20 mg/dL (ref 6–20)
CO2: 27 mmol/L (ref 22–32)
Calcium: 9.7 mg/dL (ref 8.9–10.3)
Chloride: 104 mmol/L (ref 98–111)
Creatinine: 0.69 mg/dL (ref 0.44–1.00)
GFR, Estimated: >60 mL/min (ref >60)
Glucose, Bld: 97 mg/dL (ref 70–99)
Potassium: 4 mmol/L (ref 3.5–5.1)
Sodium: 139 mmol/L (ref 135–145)
Total Bilirubin: 0.4 mg/dL (ref 0.0–1.2)
Total Protein: 7 g/dL (ref 6.5–8.1)

## 2023-05-14 NOTE — Telephone Encounter (Signed)
 Spoke with patient, stated insurance will not pay for injectables wt loss medication  If is any other Rx she can try,

## 2023-05-14 NOTE — Telephone Encounter (Signed)
 Patient notified

## 2023-05-30 ENCOUNTER — Ambulatory Visit (INDEPENDENT_AMBULATORY_CARE_PROVIDER_SITE_OTHER): Admitting: Family Medicine

## 2023-05-30 ENCOUNTER — Encounter: Payer: Self-pay | Admitting: Family Medicine

## 2023-05-30 VITALS — BP 134/86 | HR 96 | Temp 98.3°F | Ht 60.0 in | Wt 168.6 lb

## 2023-05-30 DIAGNOSIS — Z7985 Long-term (current) use of injectable non-insulin antidiabetic drugs: Secondary | ICD-10-CM | POA: Diagnosis not present

## 2023-05-30 DIAGNOSIS — R519 Headache, unspecified: Secondary | ICD-10-CM

## 2023-05-30 DIAGNOSIS — E1169 Type 2 diabetes mellitus with other specified complication: Secondary | ICD-10-CM

## 2023-05-30 MED ORDER — KETOROLAC TROMETHAMINE 60 MG/2ML IM SOLN
60.0000 mg | Freq: Once | INTRAMUSCULAR | Status: AC
  Administered 2023-05-30: 60 mg via INTRAMUSCULAR

## 2023-05-30 MED ORDER — SUMATRIPTAN SUCCINATE 50 MG PO TABS: 50.0000 mg | ORAL_TABLET | ORAL | 0 refills | Status: DC | PRN

## 2023-05-30 MED ORDER — METOCLOPRAMIDE HCL 5 MG PO TABS
5.0000 mg | ORAL_TABLET | Freq: Three times a day (TID) | ORAL | 0 refills | Status: AC | PRN
Start: 2023-05-30 — End: ?

## 2023-05-30 MED ORDER — TIRZEPATIDE 2.5 MG/0.5ML ~~LOC~~ SOAJ: 2.5000 mg | SUBCUTANEOUS | 0 refills | Status: DC

## 2023-05-30 NOTE — Patient Instructions (Addendum)
 It was very nice to see you today!  We will give you an injection of Toradol today.  Please try the Imitrex and reglan when you get home.   Let me know if your headache does not improve.  We will start Mounjaro. Please let me know how this is working for you in a few weeks.   Return if symptoms worsen or fail to improve.   Take care, Dr Jimmey Ralph  PLEASE NOTE:  If you had any lab tests, please let us know if you have not heard back within a few days. You may see your results on mychart before we have a chance to review them but we will give you a call once they are reviewed by [place].   If we ordered any referrals today, please let us know if you have not heard from their office within the next week.   If you had any urgent prescriptions sent in today, please check with the pharmacy within an hour of our visit to make sure the prescription was transmitted appropriately.   Please try these tips to maintain a healthy lifestyle:  Eat at least 3 REAL meals and 1-2 snacks per day.  Aim for no more than 5 hours between eating.  If you eat breakfast, please do so within one hour of getting up.   Each meal should contain half fruits/vegetables, one quarter protein, and one quarter carbs (no bigger than a computer mouse)  Cut down on sweet beverages. This includes juice, soda, and sweet tea.   Drink at least 1 glass of water with each meal and aim for at least 8 glasses per day  Exercise at least 150 minutes every week.

## 2023-05-30 NOTE — Assessment & Plan Note (Signed)
 She has a few elevated fasting glucoses in the chart into the 130s and up to the 170s.  She is interested in starting GLP-1 agonist to help manage her sugars.  Also interested in this to help with weight loss.  She is aware of potential side effects.  Will start Mounjaro 2.5 mg weekly.  She will follow with me in a few weeks via MyChart and we can adjust as tolerated.  Recheck A1c at next office visit.

## 2023-05-30 NOTE — Assessment & Plan Note (Signed)
 Acute flare today.  She has history of migraine and chronic headaches that had improved for quite a while until recently.  Her current headache is consistent with previous flares however longer than normal.  Her symptoms have not responded to typical over-the-counter treatments.    Unclear etiology for recent flare.  Will treat with headache cocktail today with 60 mg of Toradol.  Will also start Imitrex and Reglan that she can take when she gets home.  Given our overall reassuring exam today do not think we need to obtain imaging at this point though her history is notable for breast cancer in remission.  If her headache does not respond to above treatment plan, will check imaging and labs at that time.  We discussed reasons to return to care.

## 2023-05-30 NOTE — Progress Notes (Signed)
   Sherry Carrillo is a 58 y.o. female who presents today for an office visit.  Assessment/Plan:  Chronic Problems Addressed Today: Headache disorder Acute flare today.  She has history of migraine and chronic headaches that had improved for quite a while until recently.  Her current headache is consistent with previous flares however longer than normal.  Her symptoms have not responded to typical over-the-counter treatments.    Unclear etiology for recent flare.  Will treat with headache cocktail today with 60 mg of Toradol.  Will also start Imitrex and Reglan that she can take when she gets home.  Given our overall reassuring exam today do not think we need to obtain imaging at this point though her history is notable for breast cancer in remission.  If her headache does not respond to above treatment plan, will check imaging and labs at that time.  We discussed reasons to return to care.  T2DM (type 2 diabetes mellitus) (HCC) She has a few elevated fasting glucoses in the chart into the 130s and up to the 170s.  She is interested in starting GLP-1 agonist to help manage her sugars.  Also interested in this to help with weight loss.  She is aware of potential side effects.  Will start Mounjaro 2.5 mg weekly.  She will follow with me in a few weeks via MyChart and we can adjust as tolerated.  Recheck A1c at next office visit.     Subjective:  HPI:  Patient here with headache. This has been an issue for awhile but has had persistent headache for the last 3 weeks or so. She tried advil without much improvement. Pain located the front her her head. Pain comes and goes. She has had some associated migraine symptoms including vision changes. She has a history of migraines as well. Some nausea. No vomiting. No weakness or numbness. No photophobia. No phonophobia.        Objective:  Physical Exam: BP 134/86   Pulse 96   Temp 98.3 F (36.8 C) (Temporal)   Ht 5' (1.524 m)   Wt 168 lb 9.6 oz (76.5  kg)   SpO2 97%   BMI 32.93 kg/m   Gen: No acute distress, resting comfortably HEENT: TMs clear. CV: Regular rate and rhythm with no murmurs appreciated Pulm: Normal work of breathing, clear to auscultation bilaterally with no crackles, wheezes, or rhonchi Neuro: Cranial nerves II through XII intact.  Finger-nose - finger testing intact bilaterally.  Sensation to light touch intact throughout.  Reflexes 2+ and symmetric bilaterally. Psych: Normal affect and thought content      Brandom Kerwin M. Jimmey Ralph, MD 05/30/2023 1:50 PM

## 2023-05-31 ENCOUNTER — Other Ambulatory Visit (HOSPITAL_COMMUNITY): Payer: Self-pay

## 2023-05-31 ENCOUNTER — Telehealth: Payer: Self-pay | Admitting: *Deleted

## 2023-05-31 ENCOUNTER — Telehealth: Payer: Self-pay | Admitting: Pharmacy Technician

## 2023-05-31 ENCOUNTER — Telehealth: Payer: Self-pay

## 2023-05-31 MED ORDER — SUMATRIPTAN SUCCINATE 50 MG PO TABS: 50.0000 mg | ORAL_TABLET | ORAL | 0 refills | Status: AC | PRN

## 2023-05-31 NOTE — Telephone Encounter (Signed)
 Spoke to pt told her Rx for Imitrex is covered, no PA needed just had to change Rx to 9 tablets per month per insurance. I sent new Rx to the pharmacy. Pt verbalized understanding.

## 2023-05-31 NOTE — Addendum Note (Signed)
 Addended by: Jimmye Norman on: 05/31/2023 10:58 AM   Modules accepted: Orders

## 2023-05-31 NOTE — Telephone Encounter (Signed)
 Pharmacy Patient Advocate Encounter   Received notification from CoverMyMeds that prior authorization for SUMAtriptan Succinate 50MG  tablets is required/requested.   Insurance verification completed.   The patient is insured through [facility] .   Per test claim: The current 30 day co-pay is, $10.28.  No PA needed at this time. This test claim was processed through [facility]- copay amounts may vary at other pharmacies due to pharmacy/plan contracts, or as the patient moves through the different stages of their insurance plan.     The max insurance will cover is 9 tablets every 30 days

## 2023-05-31 NOTE — Telephone Encounter (Signed)
 Pharmacy Patient Advocate Encounter   Received notification from Pt Calls Messages that prior authorization for Mounjaro 2.5mg /0.26ml is required/requested.   Insurance verification completed.   The patient is insured through [facility] .   Per test claim: PA required; PA submitted to above mentioned insurance via Prompt PA Key/confirmation #/EOC [id number] Status is pending

## 2023-05-31 NOTE — Telephone Encounter (Signed)
 PA needed for Mounjaro 2.5 mg. Dx E11.69. Pt is diabetic.

## 2023-06-03 ENCOUNTER — Other Ambulatory Visit (HOSPITAL_COMMUNITY): Payer: Self-pay

## 2023-06-05 ENCOUNTER — Other Ambulatory Visit (HOSPITAL_COMMUNITY): Payer: Self-pay

## 2023-06-05 ENCOUNTER — Telehealth: Payer: Self-pay

## 2023-06-05 NOTE — Telephone Encounter (Signed)
 Copied from CRM [id number]. Topic: Clinical - Medication Question >> Jun 05, 2023  9:39 AM Orinda Kenner C wrote: Reason for CRM: Patient [phone] is checking on status of PA on Mounjaro. Please call back.  Patient is calling requesting update on PA for [facility], please advise

## 2023-06-06 ENCOUNTER — Other Ambulatory Visit (HOSPITAL_COMMUNITY): Payer: Self-pay

## 2023-06-06 NOTE — Telephone Encounter (Signed)
 Pharmacy Patient Advocate Encounter  Received notification from [employer] that Prior Authorization for Mounjaro 2.5 has been approved.   PA #/Case ID/Reference #: [id number].  Called [facility] [place], spoke w [facility] pt co-pay is $0.00 but pharmacy has ordered for 06/07/23.

## 2023-06-06 NOTE — Telephone Encounter (Signed)
 Patient Notified Rx Sherry Carrillo approved with 0 copay

## 2023-06-07 ENCOUNTER — Other Ambulatory Visit: Payer: Self-pay | Admitting: Family Medicine

## 2023-06-07 NOTE — Telephone Encounter (Signed)
 Last Fill: 05/30/23 2 mL/0 RF  Last OV: 05/30/23 Next OV: 08/09/23  Routing to provider for review/authorization.

## 2023-06-07 NOTE — Telephone Encounter (Signed)
 Copied from CRM [id number]. Topic: Clinical - Medication Refill >> Jun 07, 2023  1:19 PM Turkey A wrote: Most Recent Primary Care Visit:  Provider: Ardith Dark  Department: [facility]  Visit Type: OFFICE VISIT  Date: 05/30/2023  Medication: tirzepatide [facility]) 2.5 MG/0.5ML Pen  Has the patient contacted their pharmacy? Yes (Agent: If no, request that the patient contact the pharmacy for the refill. If patient does not wish to contact the pharmacy document the reason why and proceed with request.) (Agent: If yes, when and what did the pharmacy advise?) Medication has not been called in   Is this the correct pharmacy for this prescription? Yes If no, delete pharmacy and type the correct one.  This is the patient's preferred pharmacy:  [facility] #[id number] [place], [place] - [address] AT [facility] & [address] [place] [address] Phone: [phone] Fax: [phone]  [facility] - [place], [place] - [address] [place] [address] Phone: [phone] Fax: [phone]   Has the prescription been filled recently? No  Is the patient out of the medication? Yes  Has the patient been seen for an appointment in the last year OR does the patient have an upcoming appointment? Yes  Can we respond through MyChart? No  Agent: Please be advised that Rx refills may take up to 3 business days. We ask that you follow-up with your pharmacy.

## 2023-06-10 ENCOUNTER — Telehealth: Payer: Self-pay | Admitting: *Deleted

## 2023-06-10 MED ORDER — TIRZEPATIDE 2.5 MG/0.5ML ~~LOC~~ SOAJ: 2.5000 mg | SUBCUTANEOUS | 0 refills | Status: DC

## 2023-06-10 NOTE — Telephone Encounter (Signed)
 Copied from CRM [id number]. Topic: General - Other >> Jun 07, 2023  1:20 PM Turkey A wrote: Reason for CRM: Patient would like for "Francena Hanly" to call her regarding Theodosia Blender with patient, notified Rx was send to [facility]

## 2023-06-26 ENCOUNTER — Ambulatory Visit
Admission: RE | Admit: 2023-06-26 | Discharge: 2023-06-26 | Disposition: A | Discharge status: Home / Self Care | Payer: BC Managed Care – PPO | Source: Ambulatory Visit | Attending: Nurse Practitioner

## 2023-06-26 DIAGNOSIS — Z170 Estrogen receptor positive status [ER+]: Secondary | ICD-10-CM

## 2023-06-27 ENCOUNTER — Other Ambulatory Visit: Payer: Self-pay | Admitting: *Deleted

## 2023-06-27 ENCOUNTER — Telehealth: Payer: Self-pay | Admitting: *Deleted

## 2023-06-27 MED ORDER — TIRZEPATIDE 5 MG/0.5ML ~~LOC~~ SOAJ: 5.0000 mg | SUBCUTANEOUS | 0 refills | Status: DC

## 2023-06-27 NOTE — Telephone Encounter (Signed)
 Copied from CRM [id number]. Topic: General - Other >> Jun 26, 2023 12:20 PM Truddie Crumble wrote: Reason for CRM: patient called stating she was recently put on mounjaro and she need to speak to the nurse regarding this  Patient stated Rx Mounjaro working good for her, will like to change dose  Dr Jimmey Ralph ok with changes  Rx send to pharmacy  [facility]

## 2023-07-01 ENCOUNTER — Telehealth: Payer: Self-pay | Admitting: *Deleted

## 2023-07-01 ENCOUNTER — Other Ambulatory Visit: Payer: Self-pay | Admitting: Family Medicine

## 2023-07-01 ENCOUNTER — Other Ambulatory Visit: Payer: Self-pay | Admitting: *Deleted

## 2023-07-01 MED ORDER — TIRZEPATIDE 5 MG/0.5ML ~~LOC~~ SOAJ: 5.0000 mg | SUBCUTANEOUS | 0 refills | Status: DC

## 2023-07-01 NOTE — Telephone Encounter (Signed)
 Copied from CRM [id number]. Topic: Clinical - Prescription Issue >> Jul 01, 2023 10:00 AM Elizebeth Brooking wrote: Reason for CRM: Patient called in regarding tirzepatide [facility]) 5 MG/0.5ML Pen , wanted to know the status of the order being refilled, as she has called the pharmacy and they stated they did not have it there  Would like a callback at [phone]   Spoke with patient, notified Rx was send in on Friday. Resend Rx today  [facility]

## 2023-07-05 ENCOUNTER — Other Ambulatory Visit: Payer: Self-pay | Admitting: Nurse Practitioner

## 2023-07-26 ENCOUNTER — Other Ambulatory Visit: Payer: Self-pay | Admitting: Family Medicine

## 2023-07-26 MED ORDER — TIRZEPATIDE 5 MG/0.5ML ~~LOC~~ SOAJ: 5.0000 mg | SUBCUTANEOUS | 0 refills | Status: DC

## 2023-08-09 ENCOUNTER — Encounter: Payer: BC Managed Care – PPO | Admitting: Family Medicine

## 2023-10-29 ENCOUNTER — Other Ambulatory Visit: Payer: Self-pay | Admitting: Family Medicine

## 2023-10-29 MED ORDER — TIRZEPATIDE 5 MG/0.5ML ~~LOC~~ SOAJ: 5.0000 mg | SUBCUTANEOUS | 0 refills | Status: DC

## 2023-10-29 NOTE — Telephone Encounter (Signed)
 Copied from CRM [id number]. Topic: Clinical - Medication Refill >> Oct 29, 2023  1:46 PM Suzen RAMAN wrote: Medication: tirzepatide  (MOUNJARO ) 5 MG/0.5ML Pen (Increase to high dosage per patient)  Has the patient contacted their pharmacy? Yes   This is the patient's preferred pharmacy:  [facility] #[id number] [place], [place] - [address] AT [facility] & [address] [place] [address] Phone: [phone] Fax: [phone]  [facility] - [place], [place] - [address] [place] [address] Phone: [phone] Fax: [phone]  Is this the correct pharmacy for this prescription? Yes If no, delete pharmacy and type the correct one.   Has the prescription been filled recently? No  Is the patient out of the medication? No  Has the patient been seen for an appointment in the last year OR does the patient have an upcoming appointment? Yes  Can we respond through MyChart? Yes  Agent: Please be advised that Rx refills may take up to 3 business days. We ask that you follow-up with your pharmacy.

## 2023-10-30 ENCOUNTER — Telehealth: Payer: Self-pay | Admitting: *Deleted

## 2023-10-30 ENCOUNTER — Other Ambulatory Visit: Payer: Self-pay | Admitting: *Deleted

## 2023-10-30 MED ORDER — MOUNJARO 7.5 MG/0.5ML ~~LOC~~ SOAJ: 7.5000 mg | SUBCUTANEOUS | 0 refills | Status: DC

## 2023-10-30 NOTE — Telephone Encounter (Signed)
 Sherry Carrillo

## 2023-10-30 NOTE — Telephone Encounter (Signed)
 Copied from CRM #[id number]. Topic: Clinical - Medication Question >> Oct 30, 2023 11:26 AM Sherry Carrillo ORN wrote: Reason for CRM: Patient called about her Mounjaro  medication. The Provider was supposed to up the medication to 7.5 mg, but the medication that was sent to her pharmacy yesterday was a 5 mg. Would like to update her medication dosage to 7.5 mg please.

## 2023-10-30 NOTE — Telephone Encounter (Signed)
 Rx Mounjaro  7.5 mg send to pharmacy

## 2023-11-03 ENCOUNTER — Other Ambulatory Visit: Payer: Self-pay | Admitting: Nurse Practitioner

## 2023-11-03 DIAGNOSIS — C50412 Malignant neoplasm of upper-outer quadrant of left female breast: Secondary | ICD-10-CM

## 2023-11-03 NOTE — Progress Notes (Deleted)
 [facility]   [phone] [phone]    Patient Care Team: Kennyth Worth HERO, MD as PCP - General (Family Medicine) Glean Stephane BROCKS, RN (Inactive) as Oncology Nurse Navigator Tyree Nanetta SAILOR, RN as Oncology Nurse Navigator Aron Shoulders, MD as Consulting Physician (General Surgery) Dewey Rush, MD as Consulting Physician (Radiation Oncology) Lanny Callander, MD as Consulting Physician (Hematology) Avnoor Koury K, NP as Nurse Practitioner (Nurse Practitioner) Perri Bjork, PA-C as Physician Assistant (Obstetrics and Gynecology)   CHIEF COMPLAINT: Follow up left breast cancer   Oncology History Overview Note  Cancer Staging Malignant neoplasm of upper-outer quadrant of left breast, estrogen receptor positive [facility]) Staging form: Breast, AJCC 8th Edition - Clinical stage from 05/31/2020: Stage IIA (cT2, cN0, cM0, G3, ER+, PR+, HER2-) - Signed by Lanny Callander, MD on 06/01/2020 Stage prefix: Initial diagnosis    Malignant neoplasm of upper-outer quadrant of left breast, estrogen receptor positive (HCC)  05/17/2020 Mammogram   IMPRESSION: 1. Highly suspicious 2.3 cm mass involving the UPPER OUTER QUADRANT of the LEFT breast at the 1 o'clock position approximately 8 cm from the nipple. 2. No pathologic LEFT axillary lymphadenopathy. 3. No mammographic evidence of malignancy involving the RIGHT breast.   05/17/2020 Initial Biopsy   Diagnosis Breast, left, needle core biopsy, 1 o'clock - INVASIVE MAMMARY CARCINOMA.  Microscopic Comment The carcinoma appears grade 3. The greatest linear extent of tumor in any one core is 10 mm. E-cadherin will be reported separately. Ancillary studies will be reported separately. Results reported to [facility] on 05/18/2020. Dr. Alvaro reviewed the case. ADDENDUM: Immunohistochemistry for E-cadherin is positive supporting a ductal phenotype.    05/17/2020 Receptors her2   PROGNOSTIC  INDICATORS Results: IMMUNOHISTOCHEMICAL AND MORPHOMETRIC ANALYSIS PERFORMED MANUALLY The tumor cells are NEGATIVE for Her2 (1+). Estrogen Receptor: 90%, POSITIVE, STRONG STAINING INTENSITY Progesterone Receptor: 60%, POSITIVE, STRONG STAINING INTENSITY Proliferation Marker Ki67: 70%   05/31/2020 Initial Diagnosis   Malignant neoplasm of upper-outer quadrant of left breast, estrogen receptor positive (HCC)   05/31/2020 Cancer Staging   Staging form: Breast, AJCC 8th Edition - Clinical stage from 05/31/2020: Stage IIA (cT2, cN0, cM0, G3, ER+, PR+, HER2-) - Signed by Lanny Callander, MD on 06/01/2020 Stage prefix: Initial diagnosis   06/03/2020 Cancer Staging   Staging form: Breast, AJCC 8th Edition - Pathologic stage from 06/03/2020: Stage IIA (pT2, pN1a, cM0, G3, ER+, PR+, HER2-) - Signed by Lanny Callander, MD on 06/29/2020 Stage prefix: Initial diagnosis Multigene prognostic tests performed: MammaPrint Histologic grading system: 3 grade system   06/03/2020 Surgery   Left breast lumpectomy with left axillary sentinel lymph node  biopsies  A. 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  - High grade ductal carcinoma in situ.  - Invasive carcinoma is at the inferior margin focally and <0.1 cm from  the anterior margin focally.  - In situ carcinoma is <0.1 cm from the anterior margin focally.  - Biopsy site.  - See oncology table.   B. 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   C. LYMPH NODE, LEFT AXILLARY, #2, SENTINEL, EXCISION:  - One of one lymph nodes negative for carcinoma (0/1).   06/03/2020 Miscellaneous   Mammaprint - HIGH RISK   07/20/2020 Imaging   CT CAP  IMPRESSION: 1. Surgical clips in the area of the LEFT breast laterally along the margin of water  density well-circumscribed area that measures approximately 4.2 x 3.7 cm. This is likely a postoperative seroma. Correlate with any  pain or developing symptoms that would suggest infection. No gas or secondary findings to suggest this at this time. 2. No evidence of metastatic disease in the chest, abdomen or pelvis. 3. Hepatic steatosis without focal lesion. 4. Calcified coronary artery disease. 5. Aortic atherosclerosis. 6. Mildly nodular thyroid . 7. Lesion in the RIGHT thyroid  measuring up to 1.8 cm. Recommend thyroid  US  (ref: J Am Coll Radiol. 2015 Feb;12(2): 143-50).     07/20/2020 Imaging   Bone Scan  IMPRESSION: Signs of spondylosis of the thoracic and lumbar spine. No scintigraphic evidence of metastatic disease.     07/27/2020 Surgery   RE-EXCISION OF LEFT BREAST LUMPECTOMY and PAC placement by Dr Aron  FINAL MICROSCOPIC DIAGNOSIS:   A. BREAST, LEFT ANTERIOR MARGIN, EXCISION:  - Prior procedure site changes.  No carcinoma identified.   B. BREAST, LEFT INFERIOR MARGIN, EXCISION:  - Prior procedure site changes.  No carcinoma identified.    08/18/2020 - 10/20/2020 Chemotherapy   docetaxel  and Cytoxan  (TC) Q3weeks for 4 cycles.     12/13/2020 - 01/27/2021 Radiation Therapy   Site Technique Total Dose (Gy) Dose per Fx (Gy) Completed Fx Beam Energies  Breast, Left: Breast_Lt 3D 50.4/50.4 1.8 28/28 10X  Breast, Left: Breast_Lt_SCLV 3D 50.4/50.4 1.8 28/28 6X, 10X  Breast, Left: Breast_Lt_Bst 3D 10/10 2 5/5 10X      02/23/2021 -  Anti-estrogen oral therapy   Anastrozole    05/03/2021 Survivorship   SCP delivered virtually by Estephani Popper, NP      CURRENT THERAPY: Anti-estrogen therapy starting 02/2021, currently on exemestane  since 07/2021   INTERVAL HISTORY Ms. Jagger returns for follow up as scheduled. Last seen by me 05/14/23  ROS   Past Medical History:  Diagnosis Date   Abnormal pap 2009   PT HAD SURGERY BUT DON'T KNOW WHAT TYPE   Breast cancer (HCC) 04/27/2020   COVID 2021   GERD (gastroesophageal reflux disease)    Headache(784.0)    otc meds prn   Hypertension    Hyperthyroidism     Personal history of chemotherapy    Personal history of radiation therapy    SVD (spontaneous vaginal delivery)    x 2     Past Surgical History:  Procedure Laterality Date   BREAST LUMPECTOMY     BREAST LUMPECTOMY WITH RADIOACTIVE SEED AND SENTINEL LYMPH NODE BIOPSY Left 06/03/2020   Procedure: RADIOACTIVE SEED GUIDED LEFT BREAST LUMPECTOMY, LEFT AXILLARY SENTINEL LYMPH NODE BIOPSY;  Surgeon: Aron Shoulders, MD;  Location: [facility] OR;  Service: General;  Laterality: Left;   FOOT SURGERY     left   PORT-A-CATH REMOVAL N/A 02/02/2021   Procedure: REMOVAL PORT-A-CATH;  Surgeon: Aron Shoulders, MD;  Location: [facility] OR;  Service: General;  Laterality: N/A;   PORTACATH PLACEMENT Left 07/27/2020   Procedure: INSERTION PORT-A-CATH;  Surgeon: Aron Shoulders, MD;  Location: [facility] OR;  Service: General;  Laterality: Left;   RE-EXCISION OF BREAST LUMPECTOMY Left 07/27/2020   Procedure: RE-EXCISION OF LEFT BREAST LUMPECTOMY;  Surgeon: Aron Shoulders, MD;  Location: [facility] OR;  Service: General;  Laterality: Left;   TUBAL LIGATION     WISDOM TOOTH EXTRACTION       Outpatient Encounter Medications as of 11/05/2023  Medication Sig   exemestane  (AROMASIN ) 25 MG tablet TAKE 1 TABLET BY MOUTH DAILY  AFTER BREAKFAST   lisinopril -hydrochlorothiazide  (ZESTORETIC ) 20-12.5 MG tablet TAKE 1 TABLET BY MOUTH DAILY  metoCLOPramide  (REGLAN ) 5 MG tablet Take 1 tablet (5 mg total) by mouth every 8 (eight) hours as needed (headache, migraine).   pantoprazole  (PROTONIX ) 40 MG tablet Take 1 tablet (40 mg total) by mouth daily.   SUMAtriptan  (IMITREX ) 50 MG tablet Take 1 tablet (50 mg total) by mouth every 2 (two) hours as needed for migraine. May repeat in 2 hours if headache persists or recurs.   tirzepatide  (MOUNJARO ) 7.5 MG/0.5ML Pen Inject 7.5 mg into the skin once a week.   No facility-administered encounter medications on file as of 11/05/2023.     There were no vitals filed for this visit. There is no height or weight on file  to calculate BMI.   ECOG PERFORMANCE STATUS: {CHL ONC ECOG [phone]}  PHYSICAL EXAM GENERAL:alert, no distress and comfortable SKIN: no rash  EYES: sclera clear NECK: without mass LYMPH:  no palpable cervical or supraclavicular lymphadenopathy  LUNGS: clear with normal breathing effort HEART: regular rate & rhythm, no lower extremity edema ABDOMEN: abdomen soft, non-tender and normal bowel sounds NEURO: alert & oriented x 3 with fluent speech, no focal motor/sensory deficits Breast exam:  PAC without erythema    CBC    Latest Ref Rng & Units 05/14/2023    8:03 AM 10/10/2022    8:23 AM 03/05/2022    8:44 AM  CBC  WBC 4.0 - 10.5 K/uL 4.9  4.6  4.4   Hemoglobin 12.0 - 15.0 g/dL 85.9  86.7  86.5   Hematocrit 36.0 - 46.0 % 42.6  40.0  40.9   Platelets 150 - 400 K/uL 251  243  237       CMP     Latest Ref Rng & Units 05/14/2023    8:03 AM 10/10/2022    8:23 AM 03/05/2022    8:44 AM  CMP  Glucose 70 - 99 mg/dL 97  94  899   BUN 6 - 20 mg/dL 20  19  19    Creatinine 0.44 - 1.00 mg/dL 9.30  9.29  9.35   Sodium 135 - 145 mmol/L 139  140  142   Potassium 3.5 - 5.1 mmol/L 4.0  4.3  4.1   Chloride 98 - 111 mmol/L 104  107  108   CO2 22 - 32 mmol/L 27  27  27    Calcium  8.9 - 10.3 mg/dL 9.7  9.7  9.7   Total Protein 6.5 - 8.1 g/dL 7.0  6.9  6.7   Total Bilirubin 0.0 - 1.2 mg/dL 0.4  0.4  0.4   Alkaline Phos 38 - 126 U/L 94  88  79   AST 15 - 41 U/L 13  15  14    ALT 0 - 44 U/L 12  14  14        ASSESSMENT & PLAN:Mikahla L Karabin is a 58 y.o. female with    1. Malignant neoplasm of upper-outer quadrant of left breast, IDC, Stage IIA, pT2N1M0, stage IIA, ER+/PR+/HER2-, Grade III, Mammaprint high risk -Diagnosed 04/2020, s/p Left breast lumpectomy on 06/03/20 under Dr. Aron and re-excision, final margin was negative.  -Staging workup was negative for distant mets. -S/p chemo with 4 cycles TC 08/18/20 - 10/20/20 and adjuvant radiation 12/13/20 - 01/27/21. -She began antiestrogen therapy  initially with anastrozole  in 02/2021, tolerated poorly with fatigue, weakness, whole-body aches, and general malaise.  Switch to exemestane  in 07/2021 which she is tolerating better  -10/10/22: She has developed progressive stiffness and decreased grip strength in her hands.  Lengthy  discussion about AI, alternative agents (letrozole versus tamoxifen), and recurrence risk if she stops altogether.   -05/14/23: Mr. Myhand is clinically doing well.  Tolerating exemestane  much better, joint pain has improved.  Exam is benign, labs are normal.  Overall no clinical concern for recurrence -Continue breast cancer surveillance and exemestane , goal of a total 7 years, she agrees  2. Hot flashes -Post-menopausal on 12/2017 labs with Gyn Dr Madolyn Monte -She started having hot flashes in Fall 2021. Her gyn started on her Estradial patch in 04/2020 which helped very well. Due to ER/PR positive breast cancer, she stopped in March 2022.  -She experienced vomiting with Effexor . -she was previously on gabapentin , but does not need it anymore -mild, tolerable, medication not needed   3. Bone Health -DEXA 08/2022 showed osteopenia, lowest T score -1.8, no high frax score -She continues vitamin D and added calcium , continue weight bearing exercise   4. Health maintenance  -OK to consider weight loss medication per PCP, would not interact with AI    PLAN:  No orders of the defined types were placed in this encounter.     All questions were answered. The patient knows to call the clinic with any problems, questions or concerns. No barriers to learning were detected. I spent *** counseling the patient face to face. The total time spent in the appointment was *** and more than 50% was on counseling, review of test results, and coordination of care.   Blandina Renaldo K Johnathon Olden, NP 11/03/2023 8:57 PM

## 2023-11-05 ENCOUNTER — Inpatient Hospital Stay: Payer: BC Managed Care – PPO | Admitting: Nurse Practitioner

## 2023-11-05 ENCOUNTER — Inpatient Hospital Stay: Payer: BC Managed Care – PPO | Attending: Family Medicine

## 2023-11-05 ENCOUNTER — Telehealth: Payer: Self-pay

## 2023-11-05 NOTE — Telephone Encounter (Signed)
 Called patient due to not showing up for her 900 am lab appointment and her 930 app with Lacie Burton NP. No answer to call and VM was not working unable to leave a message. Will no show patient for app for today.

## 2023-12-04 ENCOUNTER — Other Ambulatory Visit: Payer: Self-pay | Admitting: Family Medicine

## 2023-12-04 ENCOUNTER — Telehealth: Payer: Self-pay | Admitting: *Deleted

## 2023-12-04 ENCOUNTER — Other Ambulatory Visit: Payer: Self-pay | Admitting: *Deleted

## 2023-12-04 MED ORDER — MOUNJARO 7.5 MG/0.5ML ~~LOC~~ SOAJ: 7.5000 mg | SUBCUTANEOUS | 0 refills | Status: DC

## 2023-12-04 NOTE — Telephone Encounter (Unsigned)
 Copied from CRM [id number]. Topic: Clinical - Medication Refill >> Dec 04, 2023 10:17 AM Tysheama G wrote: Medication: tirzepatide  (MOUNJARO ) 7.5 MG/0.5ML Pen  Has the patient contacted their pharmacy? No (Agent: If no, request that the patient contact the pharmacy for the refill. If patient does not wish to contact the pharmacy document the reason why and proceed with request.) (Agent: If yes, when and what did the pharmacy advise?)  This is the patient's preferred pharmacy:  [id number] [address], [place] - [address] AT [facility] OF [facility] & [address] [place] [address] Phone: [phone] Fax: [phone]    Is this the correct pharmacy for this prescription? Yes If no, delete pharmacy and type the correct one.   Has the prescription been filled recently? No  Is the patient out of the medication? No  Has the patient been seen for an appointment in the last year OR does the patient have an upcoming appointment? Yes  Can we respond through MyChart? Yes  Agent: Please be advised that Rx refills may take up to 3 business days. We ask that you follow-up with your pharmacy.

## 2023-12-04 NOTE — Telephone Encounter (Signed)
 Copied from CRM [id number]. Topic: Clinical - Medication Question >> Dec 04, 2023 10:19 AM Sherry Carrillo wrote: Reason for CRM: Patient wanted to know if the dr could give her a 46month supply for tirzepatide  (MOUNJARO ) 7.5 MG/0.5ML Pen. You can respond via MyChart   Rx send in today [facility]

## 2023-12-04 NOTE — Telephone Encounter (Signed)
 Copied from CRM [id number]. Topic: Clinical - Medication Refill >> Dec 04, 2023 10:17 AM Tysheama G wrote: Medication: tirzepatide  (MOUNJARO ) 7.5 MG/0.5ML Pen  Has the patient contacted their pharmacy? No (Agent: If no, request that the patient contact the pharmacy for the refill. If patient does not wish to contact the pharmacy document the reason why and proceed with request.) (Agent: If yes, when and what did the pharmacy advise?)  This is the patient's preferred pharmacy:  [id number] [address], [place] - [address] AT [facility] OF [facility] & [address] [place] [address] Phone: [phone] Fax: [phone]    Is this the correct pharmacy for this prescription? Yes If no, delete pharmacy and type the correct one.   Has the prescription been filled recently? No  Is the patient out of the medication? No  Has the patient been seen for an appointment in the last year OR does the patient have an upcoming appointment? Yes  Can we respond through MyChart? Yes  Agent: Please be advised that Rx refills may take up to 3 business days. We ask that you follow-up with your pharmacy.

## 2023-12-06 ENCOUNTER — Other Ambulatory Visit: Payer: Self-pay | Admitting: Family Medicine

## 2023-12-25 ENCOUNTER — Other Ambulatory Visit: Payer: Self-pay | Admitting: Family Medicine

## 2023-12-25 MED ORDER — MOUNJARO 7.5 MG/0.5ML ~~LOC~~ SOAJ: 7.5000 mg | SUBCUTANEOUS | 0 refills | Status: DC

## 2023-12-25 NOTE — Telephone Encounter (Signed)
 Copied from CRM [id number]. Topic: Clinical - Medication Refill >> Dec 25, 2023 11:05 AM Drema MATSU wrote: Medication: tirzepatide  (MOUNJARO ) 7.5 MG/0.5ML Pen *requesting 3 month supply  Has the patient contacted their pharmacy? Yes (Agent: If no, request that the patient contact the pharmacy for the refill. If patient does not wish to contact the pharmacy document the reason why and proceed with request.) advised to call provider  (Agent: If yes, when and what did the pharmacy advise?)  This is the patient's preferred pharmacy:  [id number] [address], [place] - [address] AT [facility] OF [facility] & [address] [place] [address] [place] [address] Phone: [phone] Fax: [phone]  Is this the correct pharmacy for this prescription? Yes If no, delete pharmacy and type the correct one.   Has the prescription been filled recently? Yes  Is the patient out of the medication? No 1 left   Has the patient been seen for an appointment in the last year OR does the patient have an upcoming appointment? Yes  Can we respond through MyChart? Yes  Agent: Please be advised that Rx refills may take up to 3 business days. We ask that you follow-up with your pharmacy.

## 2023-12-29 NOTE — Progress Notes (Unsigned)
 [facility]   [phone] [phone]    Patient Care Team: Kennyth Worth HERO, MD as PCP - General (Family Medicine) Tyree Nanetta SAILOR, RN as Oncology Nurse Navigator Aron Shoulders, MD as Consulting Physician (General Surgery) Dewey Rush, MD as Consulting Physician (Radiation Oncology) Lanny Callander, MD as Consulting Physician (Hematology) Oriana Horiuchi K, NP as Nurse Practitioner (Nurse Practitioner) Perri Bjork, PA-C as Physician Assistant (Obstetrics and Gynecology)   CHIEF COMPLAINT: Follow up left breast cancer   Oncology History Overview Note  Cancer Staging Malignant neoplasm of upper-outer quadrant of left breast, estrogen receptor positive [facility]) Staging form: Breast, AJCC 8th Edition - Clinical stage from 05/31/2020: Stage IIA (cT2, cN0, cM0, G3, ER+, PR+, HER2-) - Signed by Lanny Callander, MD on 06/01/2020 Stage prefix: Initial diagnosis    Malignant neoplasm of upper-outer quadrant of left breast, estrogen receptor positive (HCC)  05/17/2020 Mammogram   IMPRESSION: 1. Highly suspicious 2.3 cm mass involving the UPPER OUTER QUADRANT of the LEFT breast at the 1 o'clock position approximately 8 cm from the nipple. 2. No pathologic LEFT axillary lymphadenopathy. 3. No mammographic evidence of malignancy involving the RIGHT breast.   05/17/2020 Initial Biopsy   Diagnosis Breast, left, needle core biopsy, 1 o'clock - INVASIVE MAMMARY CARCINOMA.  Microscopic Comment The carcinoma appears grade 3. The greatest linear extent of tumor in any one core is 10 mm. E-cadherin will be reported separately. Ancillary studies will be reported separately. Results reported to [facility] on 05/18/2020. Dr. Alvaro reviewed the case. ADDENDUM: Immunohistochemistry for E-cadherin is positive supporting a ductal phenotype.    05/17/2020 Receptors her2   PROGNOSTIC INDICATORS Results: IMMUNOHISTOCHEMICAL AND MORPHOMETRIC ANALYSIS  PERFORMED MANUALLY The tumor cells are NEGATIVE for Her2 (1+). Estrogen Receptor: 90%, POSITIVE, STRONG STAINING INTENSITY Progesterone Receptor: 60%, POSITIVE, STRONG STAINING INTENSITY Proliferation Marker Ki67: 70%   05/31/2020 Initial Diagnosis   Malignant neoplasm of upper-outer quadrant of left breast, estrogen receptor positive (HCC)   05/31/2020 Cancer Staging   Staging form: Breast, AJCC 8th Edition - Clinical stage from 05/31/2020: Stage IIA (cT2, cN0, cM0, G3, ER+, PR+, HER2-) - Signed by Lanny Callander, MD on 06/01/2020 Stage prefix: Initial diagnosis   06/03/2020 Cancer Staging   Staging form: Breast, AJCC 8th Edition - Pathologic stage from 06/03/2020: Stage IIA (pT2, pN1a, cM0, G3, ER+, PR+, HER2-) - Signed by Lanny Callander, MD on 06/29/2020 Stage prefix: Initial diagnosis Multigene prognostic tests performed: MammaPrint Histologic grading system: 3 grade system   06/03/2020 Surgery   Left breast lumpectomy with left axillary sentinel lymph node  biopsies  A. 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  - High grade ductal carcinoma in situ.  - Invasive carcinoma is at the inferior margin focally and <0.1 cm from  the anterior margin focally.  - In situ carcinoma is <0.1 cm from the anterior margin focally.  - Biopsy site.  - See oncology table.   B. 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   C. LYMPH NODE, LEFT AXILLARY, #2, SENTINEL, EXCISION:  - One of one lymph nodes negative for carcinoma (0/1).   06/03/2020 Miscellaneous   Mammaprint - HIGH RISK   07/20/2020 Imaging   CT CAP  IMPRESSION: 1. Surgical clips in the area of the LEFT breast laterally along the margin of water density well-circumscribed area that measures approximately 4.2 x 3.7  cm. This is likely a postoperative seroma. Correlate with any pain or developing symptoms that would suggest infection. No gas or  secondary findings to suggest this at this time. 2. No evidence of metastatic disease in the chest, abdomen or pelvis. 3. Hepatic steatosis without focal lesion. 4. Calcified coronary artery disease. 5. Aortic atherosclerosis. 6. Mildly nodular thyroid . 7. Lesion in the RIGHT thyroid  measuring up to 1.8 cm. Recommend thyroid  US  (ref: J Am Coll Radiol. 2015 Feb;12(2): 143-50).     07/20/2020 Imaging   Bone Scan  IMPRESSION: Signs of spondylosis of the thoracic and lumbar spine. No scintigraphic evidence of metastatic disease.     07/27/2020 Surgery   RE-EXCISION OF LEFT BREAST LUMPECTOMY and PAC placement by Dr Aron  FINAL MICROSCOPIC DIAGNOSIS:   A. BREAST, LEFT ANTERIOR MARGIN, EXCISION:  - Prior procedure site changes.  No carcinoma identified.   B. BREAST, LEFT INFERIOR MARGIN, EXCISION:  - Prior procedure site changes.  No carcinoma identified.    08/18/2020 - 10/20/2020 Chemotherapy   docetaxel  and Cytoxan  (TC) Q3weeks for 4 cycles.     12/13/2020 - 01/27/2021 Radiation Therapy   Site Technique Total Dose (Gy) Dose per Fx (Gy) Completed Fx Beam Energies  Breast, Left: Breast_Lt 3D 50.4/50.4 1.8 28/28 10X  Breast, Left: Breast_Lt_SCLV 3D 50.4/50.4 1.8 28/28 6X, 10X  Breast, Left: Breast_Lt_Bst 3D 10/10 2 5/5 10X      02/23/2021 -  Anti-estrogen oral therapy   Anastrozole    05/03/2021 Survivorship   SCP delivered virtually by Dela Sweeny, NP      CURRENT THERAPY: Adjuvant AI, currently on Exemestane    INTERVAL HISTORY [name] returns for follow up as scheduled. Last seen by me 05/14/23.  Started Mounjaro  and has lost 30 pounds in 7 months, she feels great, with more energy.  Joint pain has resolved.  Some warm flashes but tolerable.  Denies breast concerns such as new lump/mass, nipple discharge or inversion, or skin change.  She got a cold from her grandson but otherwise has no specific complaints.  ROS  All other systems reviewed and negative  Past Medical  History:  Diagnosis Date   Abnormal pap 2009   PT HAD SURGERY BUT DON'T KNOW WHAT TYPE   Breast cancer (HCC) 04/27/2020   COVID 2021   GERD (gastroesophageal reflux disease)    Headache(784.0)    otc meds prn   Hypertension    Hyperthyroidism    Personal history of chemotherapy    Personal history of radiation therapy    SVD (spontaneous vaginal delivery)    x 2     Past Surgical History:  Procedure Laterality Date   BREAST LUMPECTOMY     BREAST LUMPECTOMY WITH RADIOACTIVE SEED AND SENTINEL LYMPH NODE BIOPSY Left 06/03/2020   Procedure: RADIOACTIVE SEED GUIDED LEFT BREAST LUMPECTOMY, LEFT AXILLARY SENTINEL LYMPH NODE BIOPSY;  Surgeon: Aron Shoulders, MD;  Location: [facility] OR;  Service: General;  Laterality: Left;   FOOT SURGERY     left   PORT-A-CATH REMOVAL N/A 02/02/2021   Procedure: REMOVAL PORT-A-CATH;  Surgeon: Aron Shoulders, MD;  Location: [facility] OR;  Service: General;  Laterality: N/A;   PORTACATH PLACEMENT Left 07/27/2020   Procedure: INSERTION PORT-A-CATH;  Surgeon: Aron Shoulders, MD;  Location: [facility] OR;  Service: General;  Laterality: Left;   RE-EXCISION OF BREAST LUMPECTOMY Left 07/27/2020   Procedure: RE-EXCISION OF LEFT BREAST LUMPECTOMY;  Surgeon: Aron Shoulders, MD;  Location: [facility] OR;  Service: General;  Laterality: Left;   TUBAL LIGATION  WISDOM TOOTH EXTRACTION       Outpatient Encounter Medications as of 12/30/2023  Medication Sig   exemestane  (AROMASIN ) 25 MG tablet TAKE 1 TABLET BY MOUTH DAILY  AFTER BREAKFAST   lisinopril -hydrochlorothiazide  (ZESTORETIC ) 20-12.5 MG tablet TAKE 1 TABLET BY MOUTH DAILY   tirzepatide  (MOUNJARO ) 7.5 MG/0.5ML Pen Inject 7.5 mg into the skin once a week.   metoCLOPramide  (REGLAN ) 5 MG tablet Take 1 tablet (5 mg total) by mouth every 8 (eight) hours as needed (headache, migraine). (Patient not taking: Reported on 12/30/2023)   pantoprazole  (PROTONIX ) 40 MG tablet Take 1 tablet (40 mg total) by mouth daily. (Patient not taking: Reported on  12/30/2023)   SUMAtriptan  (IMITREX ) 50 MG tablet Take 1 tablet (50 mg total) by mouth every 2 (two) hours as needed for migraine. May repeat in 2 hours if headache persists or recurs. (Patient not taking: Reported on 12/30/2023)   No facility-administered encounter medications on file as of 12/30/2023.     Today's Vitals   12/30/23 0831 12/30/23 0849  BP: 120/78   Pulse: 82   Resp: 17   Temp: 98.2 F (36.8 C)   SpO2: 99%   Weight: 136 lb 6.4 oz (61.9 kg)   PainSc:  0-No pain   Body mass index is 26.64 kg/m.   ECOG PERFORMANCE STATUS: 0 - Asymptomatic  PHYSICAL EXAM GENERAL:alert, no distress and comfortable SKIN: no rash  EYES: sclera clear NECK: without mass LYMPH:  no palpable cervical or supraclavicular lymphadenopathy  LUNGS: clear with normal breathing effort HEART: regular rate & rhythm, no lower extremity edema ABDOMEN: abdomen soft, non-tender and normal bowel sounds NEURO: alert & oriented x 3 with fluent speech, no focal motor/sensory deficits Breast exam: No nipple discharge or inversion.  S/p left lumpectomy, incisions completely healed.  No palpable mass or nodularity in either breast or axilla that I could appreciate   CBC    Latest Ref Rng & Units 12/30/2023    8:12 AM 05/14/2023    8:03 AM 10/10/2022    8:23 AM  CBC  WBC 4.0 - 10.5 K/uL 5.4  4.9  4.6   Hemoglobin 12.0 - 15.0 g/dL 86.5  85.9  86.7   Hematocrit 36.0 - 46.0 % 40.1  42.6  40.0   Platelets 150 - 400 K/uL 237  251  243       CMP     Latest Ref Rng & Units 12/30/2023    8:12 AM 05/14/2023    8:03 AM 10/10/2022    8:23 AM  CMP  Glucose 70 - 99 mg/dL 81  97  94   BUN 6 - 20 mg/dL 15  20  19    Creatinine 0.44 - 1.00 mg/dL 9.27  9.30  9.29   Sodium 135 - 145 mmol/L 140  139  140   Potassium 3.5 - 5.1 mmol/L 4.4  4.0  4.3   Chloride 98 - 111 mmol/L 106  104  107   CO2 22 - 32 mmol/L 30  27  27    Calcium  8.9 - 10.3 mg/dL 9.9  9.7  9.7   Total Protein 6.5 - 8.1 g/dL 7.0  7.0  6.9   Total  Bilirubin 0.0 - 1.2 mg/dL 0.4  0.4  0.4   Alkaline Phos 38 - 126 U/L 68  94  88   AST 15 - 41 U/L 13  13  15    ALT 0 - 44 U/L 9  12  14  ASSESSMENT & PLAN: Sherry Carrillo is a 58 y.o. female with    1. Malignant neoplasm of upper-outer quadrant of left breast, IDC, Stage IIA, pT2N1M0, stage IIA, ER+/PR+/HER2-, Grade III, Mammaprint high risk -Diagnosed 04/2020, s/p Left breast lumpectomy on 06/03/20 under Dr. Aron and re-excision, final margin was negative.  -Staging workup was negative for distant mets. -S/p chemo with 4 cycles TC 08/18/20 - 10/20/20 and adjuvant radiation 12/13/20 - 01/27/21. -She began antiestrogen therapy initially with anastrozole  in 02/2021, tolerated poorly with fatigue, weakness, whole-body aches, and general malaise.  Switch to exemestane  in 07/2021 which she is tolerating better  -10/10/22: She has developed progressive stiffness and decreased grip strength in her hands.  Lengthy discussion about AI, alternative agents (letrozole versus tamoxifen), and recurrence risk if she stops altogether.   -05/14/23:  Tolerating exemestane  much better, joint pain has improved.  Exam is benign, labs are normal.  Overall no clinical concern for recurrence. Continue breast cancer surveillance and exemestane , goal of a total 7 years, she agrees -12/30/2023 Sherry Carrillo is clinically doing well, tolerating exemestane .  Joint pain resolved with intentional weight loss from Mounjaro .  Exam is benign, CBC is normal.  Will follow-up the pending CMP.  No clinical concern for recurrence.  Continue breast cancer surveillance and AI   2. Hot flashes -Post-menopausal on 12/2017 labs with Gyn Dr Madolyn Monte -She started having hot flashes in Fall 2021. Her gyn started on her Estradial patch in 04/2020 which helped very well. Due to ER/PR positive breast cancer, she stopped in March 2022.  -She experienced vomiting with Effexor . -she was previously on gabapentin , but does not need it anymore -mild,  tolerable, medication not needed -Nearly resolved   3. Bone Health -DEXA 08/2022 showed osteopenia, lowest T score -1.8, no high frax score -She continues vitamin D and added calcium , continue weight bearing exercise -Repeat 08/2024   4. Health maintenance  -Started Mounjaro  per PCP, tolerating well with intentional weight loss       PLAN: -Last mammogram and today's labs reviewed -Continue breast cancer surveillance and exemestane  -Mammogram 06/2024 -Follow-up with me in 6 months, or sooner if needed  Orders Placed This Encounter  Procedures   MM DIAG BREAST TOMO BILATERAL    Standing Status:   Future    Expected Date:   06/26/2024    Expiration Date:   12/29/2024    Reason for Exam (SYMPTOM  OR DIAGNOSIS REQUIRED):   h/o L breast cancer, C density    Is the patient pregnant?:   No    [facility]      All questions were answered. The patient knows to call the clinic with any problems, questions or concerns. No barriers to learning were detected.  Icarus Partch K Savilla Turbyfill, NP 12/30/2023

## 2023-12-30 ENCOUNTER — Inpatient Hospital Stay (HOSPITAL_BASED_OUTPATIENT_CLINIC_OR_DEPARTMENT_OTHER): Admitting: Nurse Practitioner

## 2023-12-30 ENCOUNTER — Encounter: Payer: Self-pay | Admitting: Nurse Practitioner

## 2023-12-30 ENCOUNTER — Inpatient Hospital Stay: Attending: Family Medicine

## 2023-12-30 VITALS — BP 120/78 | HR 82 | Temp 98.2°F | Resp 17 | Wt 136.4 lb

## 2023-12-30 DIAGNOSIS — Z79811 Long term (current) use of aromatase inhibitors: Secondary | ICD-10-CM | POA: Diagnosis not present

## 2023-12-30 DIAGNOSIS — Z923 Personal history of irradiation: Secondary | ICD-10-CM | POA: Insufficient documentation

## 2023-12-30 DIAGNOSIS — Z9221 Personal history of antineoplastic chemotherapy: Secondary | ICD-10-CM | POA: Diagnosis not present

## 2023-12-30 DIAGNOSIS — Z170 Estrogen receptor positive status [ER+]: Secondary | ICD-10-CM

## 2023-12-30 DIAGNOSIS — Z1732 Human epidermal growth factor receptor 2 negative status: Secondary | ICD-10-CM | POA: Diagnosis not present

## 2023-12-30 DIAGNOSIS — Z1721 Progesterone receptor positive status: Secondary | ICD-10-CM | POA: Insufficient documentation

## 2023-12-30 DIAGNOSIS — C50412 Malignant neoplasm of upper-outer quadrant of left female breast: Secondary | ICD-10-CM | POA: Diagnosis present

## 2023-12-30 DIAGNOSIS — R232 Flushing: Secondary | ICD-10-CM | POA: Insufficient documentation

## 2023-12-30 DIAGNOSIS — C773 Secondary and unspecified malignant neoplasm of axilla and upper limb lymph nodes: Secondary | ICD-10-CM | POA: Diagnosis not present

## 2023-12-30 DIAGNOSIS — M8580 Other specified disorders of bone density and structure, unspecified site: Secondary | ICD-10-CM | POA: Insufficient documentation

## 2023-12-30 LAB — CMP (CANCER CENTER ONLY)
ALT: 9 U/L (ref 0–44)
AST: 13 U/L — ABNORMAL LOW (ref 15–41)
Albumin: 4.1 g/dL (ref 3.5–5.0)
Alkaline Phosphatase: 68 U/L (ref 38–126)
Anion gap: 4 — ABNORMAL LOW (ref 5–15)
BUN: 15 mg/dL (ref 6–20)
CO2: 30 mmol/L (ref 22–32)
Calcium: 9.9 mg/dL (ref 8.9–10.3)
Chloride: 106 mmol/L (ref 98–111)
Creatinine: 0.72 mg/dL (ref 0.44–1.00)
GFR, Estimated: >60 mL/min (ref >60)
Glucose, Bld: 81 mg/dL (ref 70–99)
Potassium: 4.4 mmol/L (ref 3.5–5.1)
Sodium: 140 mmol/L (ref 135–145)
Total Bilirubin: 0.4 mg/dL (ref 0.0–1.2)
Total Protein: 7 g/dL (ref 6.5–8.1)

## 2023-12-30 LAB — CBC WITH DIFFERENTIAL (CANCER CENTER ONLY)
Abs Immature Granulocytes: 0.01 K/uL (ref 0.00–0.07)
Basophils Absolute: 0 K/uL (ref 0.0–0.1)
Basophils Relative: 0 %
Eosinophils Absolute: 0.1 K/uL (ref 0.0–0.5)
Eosinophils Relative: 1 %
HCT: 40.1 % (ref 36.0–46.0)
Hemoglobin: 13.4 g/dL (ref 12.0–15.0)
Immature Granulocytes: 0 %
Lymphocytes Relative: 24 %
Lymphs Abs: 1.3 K/uL (ref 0.7–4.0)
MCH: 28.6 pg (ref 26.0–34.0)
MCHC: 33.4 g/dL (ref 30.0–36.0)
MCV: 85.7 fL (ref 80.0–100.0)
Monocytes Absolute: 0.4 K/uL (ref 0.1–1.0)
Monocytes Relative: 7 %
Neutro Abs: 3.6 K/uL (ref 1.7–7.7)
Neutrophils Relative %: 68 %
Platelet Count: 237 K/uL (ref 150–400)
RBC: 4.68 MIL/uL (ref 3.87–5.11)
RDW: 12.9 % (ref 11.5–15.5)
WBC Count: 5.4 K/uL (ref 4.0–10.5)
nRBC: 0 % (ref 0.0–0.2)

## 2024-02-10 ENCOUNTER — Other Ambulatory Visit: Payer: Self-pay | Admitting: Family Medicine

## 2024-02-10 NOTE — Telephone Encounter (Unsigned)
 Copied from CRM [id number]. Topic: Clinical - Medication Refill >> Feb 10, 2024  5:01 PM Sherry Carrillo wrote: Medication: tirzepatide  (MOUNJARO ) 7.5 MG/0.5ML Pen  Has the patient contacted their pharmacy? No (Agent: If no, request that the patient contact the pharmacy for the refill. If patient does not wish to contact the pharmacy document the reason why and proceed with request.) (Agent: If yes, when and what did the pharmacy advise?)  This is the patient's preferred pharmacy:  [id number] [place], [place] - [address] AT [facility] & [address] [place] [address] Phone: [phone] Fax: [phone]   Is this the correct pharmacy for this prescription? Yes If no, delete pharmacy and type the correct one.   Has the prescription been filled recently? No  Is the patient out of the medication? Yes  Has the patient been seen for an appointment in the last year OR does the patient have an upcoming appointment? Yes  Can we respond through MyChart? Yes  Agent: Please be advised that Rx refills may take up to 3 business days. We ask that you follow-up with your pharmacy.

## 2024-02-11 MED ORDER — MOUNJARO 7.5 MG/0.5ML ~~LOC~~ SOAJ: 7.5000 mg | SUBCUTANEOUS | 0 refills | Status: DC

## 2024-03-11 ENCOUNTER — Other Ambulatory Visit: Payer: Self-pay | Admitting: *Deleted

## 2024-03-11 ENCOUNTER — Telehealth: Payer: Self-pay | Admitting: *Deleted

## 2024-03-11 MED ORDER — MOUNJARO 7.5 MG/0.5ML ~~LOC~~ SOAJ: 7.5000 mg | SUBCUTANEOUS | 0 refills | Status: DC

## 2024-03-11 NOTE — Telephone Encounter (Signed)
 Copied from CRM #[id number]. Topic: Clinical - Prescription Issue >> Mar 11, 2024  1:44 PM Sherry Carrillo wrote: Reason for CRM: patient called in regarding status on her medication refill  tirzepatide  (MOUNJARO ) 7.5 MG/0.5ML Pen , stated she has put in a request a week ago and hasn't heard anything about it would like for Dr.Parkers nurse to give her a callback   Rx Mounjaro  send to pharmacy  [facility]

## 2024-04-22 ENCOUNTER — Other Ambulatory Visit: Payer: Self-pay | Admitting: *Deleted

## 2024-04-22 ENCOUNTER — Telehealth: Payer: Self-pay | Admitting: *Deleted

## 2024-04-22 MED ORDER — MOUNJARO 7.5 MG/0.5ML ~~LOC~~ SOAJ: 7.5000 mg | SUBCUTANEOUS | 0 refills | Status: AC

## 2024-04-22 NOTE — Telephone Encounter (Signed)
 Rx Mounjaro  refills send to [facility] pharmacy

## 2024-04-22 NOTE — Telephone Encounter (Signed)
 Copied from CRM #[id number]. Topic: Clinical - Medication Refill >> Apr 22, 2024 11:46 AM Antwanette L wrote: Medication: tirzepatide  (MOUNJARO ) 7.5 MG/0.5ML Pen  Has the patient contacted their pharmacy? No   This is the patient's preferred pharmacy:  [id number] [place], [place] - [address] AT [facility] OF [facility] & [address] [place] [address] Phone: [phone] Fax: [phone]    Is this the correct pharmacy for this prescription? Yes  Has the prescription been filled recently? Yes. Last refill was 03/11/24  Is the patient out of the medication? Yes  Has the patient been seen for an appointment in the last year OR does the patient have an upcoming appointment? Yes. Last office visit with Dr. Kennyth was 05/30/23  Can we respond through MyChart? No. Patient can be reached at [phone]  Agent: Please be advised that Rx refills may take up to 3 business days. We ask that you follow-up with your pharmacy.

## 2024-06-29 ENCOUNTER — Encounter

## 2024-06-30 ENCOUNTER — Ambulatory Visit: Admitting: Nurse Practitioner

## 2024-06-30 ENCOUNTER — Other Ambulatory Visit
